# Patient Record
Sex: Male | Born: 1940
Health system: Southern US, Community
[De-identification: ages and names within clinical notes are randomized; demographics above are authoritative.]

## PROBLEM LIST (undated history)

## (undated) DIAGNOSIS — N390 Urinary tract infection, site not specified: Secondary | ICD-10-CM

## (undated) DIAGNOSIS — N411 Chronic prostatitis: Secondary | ICD-10-CM

## (undated) DIAGNOSIS — I10 Essential (primary) hypertension: Secondary | ICD-10-CM

## (undated) DIAGNOSIS — M87059 Idiopathic aseptic necrosis of unspecified femur: Secondary | ICD-10-CM

## (undated) DIAGNOSIS — F17200 Nicotine dependence, unspecified, uncomplicated: Secondary | ICD-10-CM

## (undated) DIAGNOSIS — E119 Type 2 diabetes mellitus without complications: Secondary | ICD-10-CM

## (undated) DIAGNOSIS — G459 Transient cerebral ischemic attack, unspecified: Secondary | ICD-10-CM

## (undated) DIAGNOSIS — E785 Hyperlipidemia, unspecified: Secondary | ICD-10-CM

## (undated) DIAGNOSIS — N400 Enlarged prostate without lower urinary tract symptoms: Secondary | ICD-10-CM

## (undated) DIAGNOSIS — M1990 Unspecified osteoarthritis, unspecified site: Secondary | ICD-10-CM

## (undated) DIAGNOSIS — H9190 Unspecified hearing loss, unspecified ear: Secondary | ICD-10-CM

## (undated) DIAGNOSIS — N529 Male erectile dysfunction, unspecified: Secondary | ICD-10-CM

## (undated) HISTORY — DX: Type 2 diabetes mellitus without complications: E11.9

## (undated) HISTORY — DX: Idiopathic aseptic necrosis of unspecified femur: M87.059

## (undated) HISTORY — DX: Transient cerebral ischemic attack, unspecified: G45.9

## (undated) HISTORY — DX: Chronic prostatitis: N41.1

## (undated) HISTORY — PX: PROSTATE SURGERY: SHX751

## (undated) HISTORY — PX: JOINT REPLACEMENT: SHX530

## (undated) HISTORY — DX: Benign prostatic hyperplasia without lower urinary tract symptoms: N40.0

## (undated) HISTORY — DX: Nicotine dependence, unspecified, uncomplicated: F17.200

## (undated) HISTORY — DX: Essential (primary) hypertension: I10

## (undated) HISTORY — DX: Unspecified hearing loss, unspecified ear: H91.90

## (undated) HISTORY — DX: Hyperlipidemia, unspecified: E78.5

## (undated) HISTORY — DX: Urinary tract infection, site not specified: N39.0

## (undated) HISTORY — DX: Male erectile dysfunction, unspecified: N52.9

## (undated) HISTORY — PX: BLADDER STONE REMOVAL: SHX568

---

## 2006-08-22 ENCOUNTER — Emergency Department: Payer: Self-pay | Admitting: Emergency Medicine

## 2006-08-24 ENCOUNTER — Ambulatory Visit: Payer: Self-pay | Admitting: Orthopaedic Surgery

## 2006-08-24 ENCOUNTER — Other Ambulatory Visit: Payer: Self-pay

## 2007-01-10 HISTORY — PX: AMPUTATION: SHX166

## 2008-03-10 ENCOUNTER — Inpatient Hospital Stay: Payer: Self-pay | Admitting: Urology

## 2008-11-10 ENCOUNTER — Ambulatory Visit: Payer: Self-pay | Admitting: Family Medicine

## 2009-11-19 ENCOUNTER — Ambulatory Visit: Payer: Self-pay | Admitting: Internal Medicine

## 2010-08-10 ENCOUNTER — Ambulatory Visit: Payer: Self-pay | Admitting: Urology

## 2010-08-15 ENCOUNTER — Ambulatory Visit: Payer: Self-pay | Admitting: Anesthesiology

## 2010-08-23 ENCOUNTER — Ambulatory Visit: Payer: Self-pay | Admitting: Urology

## 2010-08-25 LAB — PATHOLOGY REPORT

## 2010-10-24 ENCOUNTER — Ambulatory Visit: Payer: Self-pay | Admitting: Sports Medicine

## 2010-11-29 ENCOUNTER — Emergency Department: Payer: Self-pay | Admitting: Emergency Medicine

## 2011-02-22 ENCOUNTER — Ambulatory Visit: Payer: Self-pay | Admitting: Orthopedic Surgery

## 2011-07-04 ENCOUNTER — Ambulatory Visit: Payer: Self-pay | Admitting: Family Medicine

## 2011-07-04 LAB — POCT INR: INR: 1 (ref <=1.1)

## 2011-07-04 LAB — PROTIME-INR: Protime: 10.4 seconds (ref 10.0–13.8)

## 2011-07-11 ENCOUNTER — Encounter: Payer: Self-pay | Admitting: Physician Assistant

## 2011-07-11 ENCOUNTER — Encounter (HOSPITAL_COMMUNITY): Payer: Self-pay | Admitting: Pharmacy Technician

## 2011-07-11 ENCOUNTER — Other Ambulatory Visit: Payer: Self-pay | Admitting: Physician Assistant

## 2011-07-11 DIAGNOSIS — E119 Type 2 diabetes mellitus without complications: Secondary | ICD-10-CM

## 2011-07-11 DIAGNOSIS — I10 Essential (primary) hypertension: Secondary | ICD-10-CM

## 2011-07-11 DIAGNOSIS — M87059 Idiopathic aseptic necrosis of unspecified femur: Secondary | ICD-10-CM | POA: Insufficient documentation

## 2011-07-11 NOTE — H&P (Signed)
Todd Diaz is an 71 y.o. male.   Chief Complaint: left knee AVN HPI: [name] is a 71 year old seen for follow-up from persistent left knee pain with medial compartment AVN. He had a MRI of the left knee 2 months ago that showed medial compartment AVN with no other significant abnormality in the knee. He continues to have left knee pain medially. Pain with weightbearing twisting and turning activities relieved by rest. He's limping and has difficulty arising from a seated position.   Past Medical History  Diagnosis Date  . Hypertension   . Diabetes mellitus   . Avascular necrosis of medial femoral condyle     Past Surgical History  Procedure Date  . Prostate surgery   . Bladder stone removal     Family History  Problem Relation Age of Onset  . Hypertension Mother   . Cancer Mother   . Heart attack Father   . Hypertension Father    Social History:  reports that he quit smoking about 35 years ago. He does not have any smokeless tobacco history on file. He reports that he drinks alcohol. His drug history not on file.  Allergies: No Known Allergies Current Outpatient Prescriptions on File Prior to Visit  Medication Sig Dispense Refill  . aspirin EC 81 MG tablet Take 81 mg by mouth daily.      . hydrochlorothiazide (HYDRODIURIL) 25 MG tablet Take 25 mg by mouth daily.      Marland Kitchen lisinopril (PRINIVIL,ZESTRIL) 20 MG tablet Take 20 mg by mouth daily.      . metFORMIN (GLUCOPHAGE) 500 MG tablet Take 500 mg by mouth 2 (two) times daily with a meal.      . niacin (NIASPAN) 500 MG CR tablet Take 500 mg by mouth at bedtime.         (Not in a hospital admission)  No results found for this or any previous visit (from the past 48 hour(s)). No results found.  Review of Systems  Constitutional: Negative.   HENT: Negative.   Eyes: Negative.   Respiratory: Negative.   Cardiovascular: Negative.   Gastrointestinal: Negative.   Genitourinary: Negative.   Musculoskeletal: Positive for  joint pain.       Left knee pain  Skin: Negative.   Neurological: Negative.   Endo/Heme/Allergies:       Not pertinent to current symptomatology therefore not examined.    Blood pressure 144/81, temperature 97.5 F (36.4 C), height 5\' 10"  (1.778 m), weight 90.719 kg (200 lb), SpO2 97.00%. Physical Exam  Constitutional: He is oriented to person, place, and time. He appears well-developed and well-nourished.  HENT:  Head: Normocephalic and atraumatic.  Eyes: Conjunctivae and EOM are normal. Pupils are equal, round, and reactive to light.  Neck: Normal range of motion. Neck supple.  Cardiovascular: Normal rate and regular rhythm.   Respiratory: Effort normal and breath sounds normal.  GI: Soft. Bowel sounds are normal.  Genitourinary:       Not pertinent to current symptomatology therefore not examined.  Musculoskeletal: He exhibits edema and tenderness.         He is independently ambulatory with a moderately antalgic gait. He has active range of motion 0-120 degrees bilaterally. Medial joint line tenderness on the left.  No pain on the right.  2+ crepitus bilaterally.  1+ synovitis bilaterally.      Neurological: He is alert and oriented to person, place, and time. He has normal reflexes.  Skin: Skin is warm and  dry.  Psychiatric: He has a normal mood and affect. His behavior is normal. Judgment and thought content normal.     Assessment Patient Active Problem List  Diagnosis  . Hypertension  . Diabetes mellitus  . Avascular necrosis of medial femoral condyle    Plan I talk to him about this in detail. I think with the significant findings lack of response to conservative care that he is a candidate for unicompartmental arthroplasty and I would recommend we proceed with this due to failed conservative care. Discussed risks benefits and possible complications of the surgery in detail and he understands this completely. He has been cleared preoperatively by his primary care  physician Dr. Nilda Simmer.  Todd Diaz 07/11/2011, 4:30 PM

## 2011-07-17 ENCOUNTER — Encounter (HOSPITAL_COMMUNITY): Payer: Self-pay

## 2011-07-17 ENCOUNTER — Encounter (HOSPITAL_COMMUNITY)
Admission: RE | Admit: 2011-07-17 | Discharge: 2011-07-17 | Disposition: A | Discharge status: Home / Self Care | Payer: Medicare PPO | Source: Ambulatory Visit | Attending: Orthopedic Surgery | Admitting: Orthopedic Surgery

## 2011-07-17 ENCOUNTER — Encounter (HOSPITAL_COMMUNITY): Admission: RE | Admit: 2011-07-17 | Payer: Medicare PPO | Source: Ambulatory Visit

## 2011-07-17 HISTORY — DX: Unspecified osteoarthritis, unspecified site: M19.90

## 2011-07-17 LAB — DIFFERENTIAL
Basophils Absolute: 0 10*3/uL (ref 0.0–0.1)
Basophils Relative: 0 % (ref 0–1)
Eosinophils Absolute: 0.1 10*3/uL (ref 0.0–0.7)
Eosinophils Relative: 2 % (ref 0–5)
Lymphocytes Relative: 26 % (ref 12–46)
Lymphs Abs: 1.7 10*3/uL (ref 0.7–4.0)
Monocytes Absolute: 0.6 10*3/uL (ref 0.1–1.0)
Monocytes Relative: 9 % (ref 3–12)
Neutro Abs: 4.2 10*3/uL (ref 1.7–7.7)
Neutrophils Relative %: 63 % (ref 43–77)

## 2011-07-17 LAB — ABO/RH: ABO/RH(D): O POS

## 2011-07-17 LAB — URINALYSIS, ROUTINE W REFLEX MICROSCOPIC
Bilirubin Urine: NEGATIVE
Glucose, UA: NEGATIVE mg/dL
Hgb urine dipstick: NEGATIVE
Ketones, ur: NEGATIVE mg/dL
Leukocytes, UA: NEGATIVE
Nitrite: NEGATIVE
Protein, ur: NEGATIVE mg/dL
Specific Gravity, Urine: 1.011 (ref 1.005–1.030)
Urobilinogen, UA: 0.2 mg/dL (ref 0.0–1.0)
pH: 5.5 (ref 5.0–8.0)

## 2011-07-17 LAB — COMPREHENSIVE METABOLIC PANEL
ALT: 22 U/L (ref 0–53)
AST: 26 U/L (ref 0–37)
Albumin: 4.1 g/dL (ref 3.5–5.2)
Alkaline Phosphatase: 84 U/L (ref 39–117)
BUN: 9 mg/dL (ref 6–23)
CO2: 28 mEq/L (ref 19–32)
Calcium: 9.8 mg/dL (ref 8.4–10.5)
Chloride: 102 mEq/L (ref 96–112)
Creatinine, Ser: 0.76 mg/dL (ref 0.50–1.35)
GFR calc Af Amer: >90 mL/min (ref >90)
GFR calc non Af Amer: 89 mL/min — ABNORMAL LOW (ref >90)
Glucose, Bld: 97 mg/dL (ref 70–99)
Potassium: 4.1 mEq/L (ref 3.5–5.1)
Sodium: 140 mEq/L (ref 135–145)
Total Bilirubin: 0.4 mg/dL (ref 0.3–1.2)
Total Protein: 7.6 g/dL (ref 6.0–8.3)

## 2011-07-17 LAB — CBC
HCT: 46.5 % (ref 39.0–52.0)
Hemoglobin: 16 g/dL (ref 13.0–17.0)
MCH: 31.5 pg (ref 26.0–34.0)
MCHC: 34.4 g/dL (ref 30.0–36.0)
MCV: 91.5 fL (ref 78.0–100.0)
Platelets: 183 10*3/uL (ref 150–400)
RBC: 5.08 MIL/uL (ref 4.22–5.81)
RDW: 13.5 % (ref 11.5–15.5)
WBC: 6.6 10*3/uL (ref 4.0–10.5)

## 2011-07-17 LAB — TYPE AND SCREEN
ABO/RH(D): O POS
Antibody Screen: NEGATIVE

## 2011-07-17 LAB — PROTIME-INR
INR: 0.96 (ref 0.00–1.49)
Prothrombin Time: 13 seconds (ref 11.6–15.2)

## 2011-07-17 LAB — SURGICAL PCR SCREEN
MRSA, PCR: NEGATIVE
Staphylococcus aureus: NEGATIVE

## 2011-07-17 LAB — APTT: aPTT: 32 seconds (ref 24–37)

## 2011-07-17 NOTE — Pre-Procedure Instructions (Signed)
20 ESDRAS DELAIR  07/17/2011   Your procedure is scheduled on:   Monday  07/24/11  Report to [facility] at 1000 AM.  Call this number if you have problems the morning of surgery: [phone]   Remember:   Do not eat food OR DRINK After Midnight   Take these medicines the morning of surgery with A SIP OF WATER: NONE     Do not wear jewelry, make-up or nail polish.  Do not wear lotions, powders, or perfumes. You may wear deodorant.  Do not shave 48 hours prior to surgery. Men may shave face and neck.  Do not bring valuables to the hospital.  Contacts, dentures or bridgework may not be worn into surgery.  Leave suitcase in the car. After surgery it may be brought to your room.  For patients admitted to the hospital, checkout time is 11:00 AM the day of discharge.   Patients discharged the day of surgery will not be allowed to drive home.  Name and phone number of your driver:   Special Instructions: CHG Shower Use Special Wash: 1/2 bottle night before surgery and 1/2 bottle morning of surgery.   Please read over the following fact sheets that you were given: Pain Booklet, Coughing and Deep Breathing, Blood Transfusion Information, Total Joint Packet, MRSA Information and Surgical Site Infection Prevention

## 2011-07-17 NOTE — Progress Notes (Signed)
REQUESTED EKG AND CXR FROM  PATIENT'S MED MD (DR [facility]) [phone]

## 2011-07-18 ENCOUNTER — Other Ambulatory Visit (HOSPITAL_COMMUNITY): Payer: Self-pay

## 2011-07-19 ENCOUNTER — Other Ambulatory Visit: Payer: Self-pay | Admitting: Physician Assistant

## 2011-07-19 LAB — URINE CULTURE: Colony Count: 25000

## 2011-07-19 NOTE — Progress Notes (Signed)
Urine Culture noted to abnormal results and Reason for consent(consent order) has not been updated w/correct reason even after Fritzi Mandes, [place], notified on 07/17/11.  Office notified of both and message sent to Dr. Sherene Sires PA, Kirsten per office personal-Lauren.//L. Mohit Zirbes,RN

## 2011-07-20 NOTE — Progress Notes (Signed)
Dr. Sherene Sires office called unit to inform consent has been revised w/correct info & the need for urine culture on DOS.//L. Shivam Mestas,RN

## 2011-07-21 NOTE — Progress Notes (Signed)
INSTRUCTED PATIENT TO ARRIVE AT 530 AM Monday 07/24/11.

## 2011-07-23 MED ORDER — CEFAZOLIN SODIUM-DEXTROSE 2-3 GM-% IV SOLR
2.0000 g | INTRAVENOUS | Status: AC
  Administered 2011-07-24: 2 g via INTRAVENOUS
  Filled 2011-07-23: qty 50

## 2011-07-24 ENCOUNTER — Encounter (HOSPITAL_COMMUNITY): Payer: Self-pay | Admitting: *Deleted

## 2011-07-24 ENCOUNTER — Encounter (HOSPITAL_COMMUNITY): Payer: Self-pay | Admitting: Certified Registered"

## 2011-07-24 ENCOUNTER — Encounter (HOSPITAL_COMMUNITY)
Admission: RE | Disposition: A | Discharge status: Home / Self Care | Payer: Self-pay | Source: Ambulatory Visit | Attending: Orthopedic Surgery

## 2011-07-24 ENCOUNTER — Inpatient Hospital Stay (HOSPITAL_COMMUNITY)
Admission: RE | Admit: 2011-07-24 | Discharge: 2011-07-25 | DRG: 470 | Disposition: A | Discharge status: Home / Self Care | Payer: Medicare PPO | Source: Ambulatory Visit | Attending: Orthopedic Surgery | Admitting: Orthopedic Surgery

## 2011-07-24 ENCOUNTER — Ambulatory Visit (HOSPITAL_COMMUNITY): Payer: Medicare PPO | Admitting: Certified Registered"

## 2011-07-24 DIAGNOSIS — Z01812 Encounter for preprocedural laboratory examination: Secondary | ICD-10-CM

## 2011-07-24 DIAGNOSIS — Z7982 Long term (current) use of aspirin: Secondary | ICD-10-CM

## 2011-07-24 DIAGNOSIS — Z87891 Personal history of nicotine dependence: Secondary | ICD-10-CM

## 2011-07-24 DIAGNOSIS — I10 Essential (primary) hypertension: Secondary | ICD-10-CM | POA: Diagnosis present

## 2011-07-24 DIAGNOSIS — M8708 Idiopathic aseptic necrosis of bone, other site: Principal | ICD-10-CM | POA: Diagnosis present

## 2011-07-24 DIAGNOSIS — M87059 Idiopathic aseptic necrosis of unspecified femur: Secondary | ICD-10-CM | POA: Diagnosis present

## 2011-07-24 DIAGNOSIS — E119 Type 2 diabetes mellitus without complications: Secondary | ICD-10-CM | POA: Diagnosis present

## 2011-07-24 DIAGNOSIS — IMO0002 Reserved for concepts with insufficient information to code with codable children: Secondary | ICD-10-CM | POA: Diagnosis present

## 2011-07-24 DIAGNOSIS — M1710 Unilateral primary osteoarthritis, unspecified knee: Secondary | ICD-10-CM | POA: Diagnosis present

## 2011-07-24 HISTORY — PX: PARTIAL KNEE ARTHROPLASTY: SHX2174

## 2011-07-24 LAB — GLUCOSE, CAPILLARY
Glucose-Capillary: 110 mg/dL — ABNORMAL HIGH (ref 70–99)
Glucose-Capillary: 128 mg/dL — ABNORMAL HIGH (ref 70–99)
Glucose-Capillary: 138 mg/dL — ABNORMAL HIGH (ref 70–99)
Glucose-Capillary: 117 mg/dL — ABNORMAL HIGH (ref 70–99)
Glucose-Capillary: 120 mg/dL — ABNORMAL HIGH (ref 70–99)

## 2011-07-24 LAB — HEMOGLOBIN A1C
Hgb A1c MFr Bld: 5.8 % — ABNORMAL HIGH (ref <5.7)
Mean Plasma Glucose: 120 mg/dL — ABNORMAL HIGH (ref <117)

## 2011-07-24 SURGERY — ARTHROPLASTY, KNEE, UNICOMPARTMENTAL
Anesthesia: General | Site: Knee | Laterality: Left | Wound class: Clean

## 2011-07-24 MED ORDER — MENTHOL 3 MG MT LOZG: 1.0000 | LOZENGE | OROMUCOSAL | Status: DC | PRN

## 2011-07-24 MED ORDER — GLYCOPYRROLATE 0.2 MG/ML IJ SOLN
INTRAMUSCULAR | Status: DC | PRN
  Administered 2011-07-24: 0.4 mg via INTRAVENOUS

## 2011-07-24 MED ORDER — MIDAZOLAM HCL 5 MG/5ML IJ SOLN
INTRAMUSCULAR | Status: DC | PRN
  Administered 2011-07-24: 2 mg via INTRAVENOUS

## 2011-07-24 MED ORDER — METOCLOPRAMIDE HCL 5 MG/ML IJ SOLN: 5.0000 mg | Freq: Three times a day (TID) | INTRAMUSCULAR | Status: DC | PRN

## 2011-07-24 MED ORDER — BUPIVACAINE-EPINEPHRINE 0.25% -1:200000 IJ SOLN
INTRAMUSCULAR | Status: DC | PRN
  Administered 2011-07-24: 30 mL

## 2011-07-24 MED ORDER — CELECOXIB 200 MG PO CAPS
ORAL_CAPSULE | ORAL | Status: AC
  Filled 2011-07-24: qty 1

## 2011-07-24 MED ORDER — PROPOFOL 10 MG/ML IV EMUL
INTRAVENOUS | Status: DC | PRN
  Administered 2011-07-24: 140 mg via INTRAVENOUS

## 2011-07-24 MED ORDER — OXYCODONE HCL 5 MG PO TABS
5.0000 mg | ORAL_TABLET | ORAL | Status: DC | PRN
  Administered 2011-07-24: 10 mg via ORAL

## 2011-07-24 MED ORDER — ENOXAPARIN SODIUM 30 MG/0.3ML ~~LOC~~ SOLN
30.0000 mg | Freq: Two times a day (BID) | SUBCUTANEOUS | Status: DC
  Administered 2011-07-25: 30 mg via SUBCUTANEOUS
  Filled 2011-07-24 (×3): qty 0.3

## 2011-07-24 MED ORDER — LIDOCAINE HCL (CARDIAC) 20 MG/ML IV SOLN
INTRAVENOUS | Status: DC | PRN
  Administered 2011-07-24: 30 mg via INTRAVENOUS

## 2011-07-24 MED ORDER — ACETAMINOPHEN 650 MG RE SUPP: 650.0000 mg | Freq: Four times a day (QID) | RECTAL | Status: DC | PRN

## 2011-07-24 MED ORDER — EPHEDRINE SULFATE 50 MG/ML IJ SOLN
INTRAMUSCULAR | Status: DC | PRN
  Administered 2011-07-24 (×3): 5 mg via INTRAVENOUS

## 2011-07-24 MED ORDER — DIAZEPAM 5 MG/ML IJ SOLN
5.0000 mg | Freq: Once | INTRAMUSCULAR | Status: AC
  Administered 2011-07-24: 5 mg via INTRAVENOUS
  Filled 2011-07-24: qty 2

## 2011-07-24 MED ORDER — DIAZEPAM 5 MG PO TABS
5.0000 mg | ORAL_TABLET | Freq: Four times a day (QID) | ORAL | Status: DC | PRN
  Administered 2011-07-24: 5 mg via ORAL
  Filled 2011-07-24: qty 1

## 2011-07-24 MED ORDER — BUPIVACAINE-EPINEPHRINE PF 0.25-1:200000 % IJ SOLN
INTRAMUSCULAR | Status: AC
Start: 2011-07-24 — End: 2011-07-24
  Filled 2011-07-24: qty 30

## 2011-07-24 MED ORDER — INSULIN ASPART 100 UNIT/ML ~~LOC~~ SOLN: 0.0000 [IU] | Freq: Three times a day (TID) | SUBCUTANEOUS | Status: DC

## 2011-07-24 MED ORDER — LISINOPRIL 20 MG PO TABS
20.0000 mg | ORAL_TABLET | Freq: Every day | ORAL | Status: DC
  Administered 2011-07-24 – 2011-07-25 (×2): 20 mg via ORAL
  Filled 2011-07-24 (×2): qty 1

## 2011-07-24 MED ORDER — CEFAZOLIN SODIUM-DEXTROSE 2-3 GM-% IV SOLR
2.0000 g | Freq: Four times a day (QID) | INTRAVENOUS | Status: AC
  Administered 2011-07-24 (×2): 2 g via INTRAVENOUS
  Filled 2011-07-24 (×2): qty 50

## 2011-07-24 MED ORDER — HYDROMORPHONE HCL PF 1 MG/ML IJ SOLN: 0.5000 mg | INTRAMUSCULAR | Status: DC | PRN

## 2011-07-24 MED ORDER — NIACIN ER (ANTIHYPERLIPIDEMIC) 500 MG PO TBCR
500.0000 mg | EXTENDED_RELEASE_TABLET | Freq: Every day | ORAL | Status: DC
  Administered 2011-07-24: 500 mg via ORAL
  Filled 2011-07-24 (×2): qty 1

## 2011-07-24 MED ORDER — ROCURONIUM BROMIDE 100 MG/10ML IV SOLN
INTRAVENOUS | Status: DC | PRN
  Administered 2011-07-24: 50 mg via INTRAVENOUS

## 2011-07-24 MED ORDER — METOCLOPRAMIDE HCL 10 MG PO TABS: 5.0000 mg | ORAL_TABLET | Freq: Three times a day (TID) | ORAL | Status: DC | PRN

## 2011-07-24 MED ORDER — ONDANSETRON HCL 4 MG PO TABS: 4.0000 mg | ORAL_TABLET | Freq: Four times a day (QID) | ORAL | Status: DC | PRN

## 2011-07-24 MED ORDER — BISACODYL 5 MG PO TBEC
5.0000 mg | DELAYED_RELEASE_TABLET | Freq: Every day | ORAL | Status: DC
  Administered 2011-07-24: 5 mg via ORAL
  Filled 2011-07-24: qty 1

## 2011-07-24 MED ORDER — CELECOXIB 200 MG PO CAPS
200.0000 mg | ORAL_CAPSULE | Freq: Two times a day (BID) | ORAL | Status: DC
  Administered 2011-07-24 – 2011-07-25 (×3): 200 mg via ORAL
  Filled 2011-07-24 (×3): qty 1

## 2011-07-24 MED ORDER — LACTATED RINGERS IV SOLN
INTRAVENOUS | Status: DC | PRN
  Administered 2011-07-24 (×3): via INTRAVENOUS

## 2011-07-24 MED ORDER — PHENOL 1.4 % MT LIQD: 1.0000 | OROMUCOSAL | Status: DC | PRN

## 2011-07-24 MED ORDER — ONDANSETRON HCL 4 MG/2ML IJ SOLN: 4.0000 mg | Freq: Four times a day (QID) | INTRAMUSCULAR | Status: DC | PRN

## 2011-07-24 MED ORDER — POTASSIUM CHLORIDE IN NACL 20-0.9 MEQ/L-% IV SOLN
INTRAVENOUS | Status: DC
  Administered 2011-07-24: 13:00:00 via INTRAVENOUS
  Filled 2011-07-24 (×4): qty 1000

## 2011-07-24 MED ORDER — OXYCODONE HCL 5 MG PO TABS
ORAL_TABLET | ORAL | Status: AC
  Filled 2011-07-24: qty 2

## 2011-07-24 MED ORDER — NEOSTIGMINE METHYLSULFATE 1 MG/ML IJ SOLN
INTRAMUSCULAR | Status: DC | PRN
  Administered 2011-07-24: 3 mg via INTRAVENOUS

## 2011-07-24 MED ORDER — INSULIN ASPART 100 UNIT/ML ~~LOC~~ SOLN: 0.0000 [IU] | Freq: Every day | SUBCUTANEOUS | Status: DC

## 2011-07-24 MED ORDER — CEFUROXIME SODIUM 1.5 G IJ SOLR
INTRAMUSCULAR | Status: AC
Start: 2011-07-24 — End: 2011-07-24
  Filled 2011-07-24: qty 1.5

## 2011-07-24 MED ORDER — ONDANSETRON HCL 4 MG/2ML IJ SOLN
INTRAMUSCULAR | Status: DC | PRN
  Administered 2011-07-24: 4 mg via INTRAVENOUS

## 2011-07-24 MED ORDER — FENTANYL CITRATE 0.05 MG/ML IJ SOLN
INTRAMUSCULAR | Status: DC | PRN
  Administered 2011-07-24: 100 ug via INTRAVENOUS
  Administered 2011-07-24: 50 ug via INTRAVENOUS
  Administered 2011-07-24: 100 ug via INTRAVENOUS
  Administered 2011-07-24: 50 ug via INTRAVENOUS

## 2011-07-24 MED ORDER — ONDANSETRON HCL 4 MG/2ML IJ SOLN: 4.0000 mg | Freq: Once | INTRAMUSCULAR | Status: DC | PRN

## 2011-07-24 MED ORDER — CHLORHEXIDINE GLUCONATE 4 % EX LIQD: 60.0000 mL | Freq: Once | CUTANEOUS | Status: DC

## 2011-07-24 MED ORDER — DOCUSATE SODIUM 100 MG PO CAPS
100.0000 mg | ORAL_CAPSULE | Freq: Two times a day (BID) | ORAL | Status: DC
  Administered 2011-07-24 – 2011-07-25 (×3): 100 mg via ORAL
  Filled 2011-07-24 (×3): qty 1

## 2011-07-24 MED ORDER — CEFUROXIME SODIUM 1.5 G IJ SOLR
INTRAMUSCULAR | Status: DC | PRN
  Administered 2011-07-24: 1.5 g

## 2011-07-24 MED ORDER — POVIDONE-IODINE 7.5 % EX SOLN
Freq: Once | CUTANEOUS | Status: DC
  Filled 2011-07-24: qty 118

## 2011-07-24 MED ORDER — ACETAMINOPHEN 325 MG PO TABS: 650.0000 mg | ORAL_TABLET | Freq: Four times a day (QID) | ORAL | Status: DC | PRN

## 2011-07-24 MED ORDER — SODIUM CHLORIDE 0.9 % IR SOLN
Status: DC | PRN
  Administered 2011-07-24: 1000 mL

## 2011-07-24 MED ORDER — HYDROMORPHONE HCL PF 1 MG/ML IJ SOLN: 0.2500 mg | INTRAMUSCULAR | Status: DC | PRN

## 2011-07-24 MED ORDER — LACTATED RINGERS IV SOLN: INTRAVENOUS | Status: DC

## 2011-07-24 SURGICAL SUPPLY — 72 items
BANDAGE ESMARK 6X9 LF (GAUZE/BANDAGES/DRESSINGS) ×2 IMPLANT
BLADE SAGITTAL 25.0X1.19X90 (BLADE) ×3 IMPLANT
BLADE SAW SGTL 11.0X1.19X90.0M (BLADE) IMPLANT
BLADE SAW SGTL 13.0X1.19X90.0M (BLADE) ×3 IMPLANT
BLADE SURG 10 STRL SS (BLADE) ×6 IMPLANT
BNDG ELASTIC 6X15 VLCR STRL LF (GAUZE/BANDAGES/DRESSINGS) IMPLANT
BNDG ESMARK 6X9 LF (GAUZE/BANDAGES/DRESSINGS) ×3
BOWL SMART MIX CTS (DISPOSABLE) ×3 IMPLANT
CEMENT HV SMART SET (Cement) ×6 IMPLANT
CLOTH BEACON ORANGE TIMEOUT ST (SAFETY) ×3 IMPLANT
COVER BACK TABLE 24X17X13 BIG (DRAPES) IMPLANT
COVER PROBE W GEL 5X96 (DRAPES) IMPLANT
COVER SURGICAL LIGHT HANDLE (MISCELLANEOUS) ×3 IMPLANT
CUFF TOURNIQUET SINGLE 34IN LL (TOURNIQUET CUFF) ×3 IMPLANT
CUFF TOURNIQUET SINGLE 44IN (TOURNIQUET CUFF) IMPLANT
DRAPE EXTREMITY T 121X128X90 (DRAPE) ×3 IMPLANT
DRAPE INCISE IOBAN 66X45 STRL (DRAPES) ×3 IMPLANT
DRAPE PROXIMA HALF (DRAPES) ×3 IMPLANT
DRAPE U-SHAPE 47X51 STRL (DRAPES) ×3 IMPLANT
DRSG ADAPTIC 3X8 NADH LF (GAUZE/BANDAGES/DRESSINGS) IMPLANT
DRSG PAD ABDOMINAL 8X10 ST (GAUZE/BANDAGES/DRESSINGS) ×3 IMPLANT
DURAPREP 26ML APPLICATOR (WOUND CARE) ×3 IMPLANT
ELECT CAUTERY BLADE 6.4 (BLADE) ×3 IMPLANT
ELECT REM PT RETURN 9FT ADLT (ELECTROSURGICAL) ×3
ELECTRODE REM PT RTRN 9FT ADLT (ELECTROSURGICAL) ×2 IMPLANT
EVACUATOR 1/8 PVC DRAIN (DRAIN) ×3 IMPLANT
FACESHIELD LNG OPTICON STERILE (SAFETY) ×3 IMPLANT
GLOVE BIO SURGEON STRL SZ7 (GLOVE) ×3 IMPLANT
GLOVE BIOGEL PI IND STRL 7.0 (GLOVE) ×4 IMPLANT
GLOVE BIOGEL PI IND STRL 7.5 (GLOVE) ×4 IMPLANT
GLOVE BIOGEL PI IND STRL 8 (GLOVE) ×2 IMPLANT
GLOVE BIOGEL PI INDICATOR 7.0 (GLOVE) ×2
GLOVE BIOGEL PI INDICATOR 7.5 (GLOVE) ×2
GLOVE BIOGEL PI INDICATOR 8 (GLOVE) ×1
GLOVE SS BIOGEL STRL SZ 7.5 (GLOVE) ×2 IMPLANT
GLOVE SUPERSENSE BIOGEL SZ 7.5 (GLOVE) ×1
GLOVE SURG SS PI 6.5 STRL IVOR (GLOVE) ×6 IMPLANT
GLOVE SURG SS PI 7.5 STRL IVOR (GLOVE) ×3 IMPLANT
GOWN PREVENTION PLUS XLARGE (GOWN DISPOSABLE) IMPLANT
GOWN STRL NON-REIN LRG LVL3 (GOWN DISPOSABLE) IMPLANT
GOWN STRL REIN XL XLG (GOWN DISPOSABLE) ×12 IMPLANT
HANDPIECE INTERPULSE COAX TIP (DISPOSABLE) ×1
HOOD PEEL AWAY FACE SHEILD DIS (HOOD) ×9 IMPLANT
IMMOBILIZER KNEE 22 UNIV (SOFTGOODS) IMPLANT
INSERT CUSHION PRONEVIEW LG (MISCELLANEOUS) IMPLANT
KIT BASIN OR (CUSTOM PROCEDURE TRAY) ×3 IMPLANT
KIT ROOM TURNOVER OR (KITS) ×3 IMPLANT
KIT SAW BLADE (KITS) ×3 IMPLANT
MANIFOLD NEPTUNE II (INSTRUMENTS) ×3 IMPLANT
NS IRRIG 1000ML POUR BTL (IV SOLUTION) ×3 IMPLANT
PACK TOTAL JOINT (CUSTOM PROCEDURE TRAY) ×3 IMPLANT
PAD ARMBOARD 7.5X6 YLW CONV (MISCELLANEOUS) ×6 IMPLANT
PAD CAST 4YDX4 CTTN HI CHSV (CAST SUPPLIES) IMPLANT
PADDING CAST COTTON 4X4 STRL (CAST SUPPLIES)
PADDING CAST COTTON 6X4 STRL (CAST SUPPLIES) IMPLANT
POSITIONER HEAD PRONE TRACH (MISCELLANEOUS) ×3 IMPLANT
RUBBERBAND STERILE (MISCELLANEOUS) ×3 IMPLANT
SET HNDPC FAN SPRY TIP SCT (DISPOSABLE) ×2 IMPLANT
SPONGE GAUZE 4X4 12PLY (GAUZE/BANDAGES/DRESSINGS) IMPLANT
STRIP CLOSURE SKIN 1/2X4 (GAUZE/BANDAGES/DRESSINGS) IMPLANT
SUCTION FRAZIER TIP 10 FR DISP (SUCTIONS) ×3 IMPLANT
SUT MNCRL AB 3-0 PS2 18 (SUTURE) ×3 IMPLANT
SUT VIC AB 0 CT1 27 (SUTURE) ×2
SUT VIC AB 0 CT1 27XBRD ANBCTR (SUTURE) ×4 IMPLANT
SUT VIC AB 2-0 CT1 27 (SUTURE) ×2
SUT VIC AB 2-0 CT1 TAPERPNT 27 (SUTURE) ×4 IMPLANT
SUT VLOC 180 0 24IN GS25 (SUTURE) ×3 IMPLANT
SYR 30ML SLIP (SYRINGE) ×3 IMPLANT
TOWEL OR 17X24 6PK STRL BLUE (TOWEL DISPOSABLE) ×3 IMPLANT
TOWEL OR 17X26 10 PK STRL BLUE (TOWEL DISPOSABLE) ×3 IMPLANT
TRAY FOLEY CATH 14FR (SET/KITS/TRAYS/PACK) ×3 IMPLANT
WATER STERILE IRR 1000ML POUR (IV SOLUTION) ×3 IMPLANT

## 2011-07-24 NOTE — Brief Op Note (Signed)
07/24/2011  11:56 AM  PATIENT:  Todd Diaz  70 y.o. male  PRE-OPERATIVE DIAGNOSIS:  DJD LEFT KNEE  POST-OPERATIVE DIAGNOSIS:  DJD LEFT KNEE  PROCEDURE:  Procedure(s) (LRB): UNICOMPARTMENTAL KNEE (Left)  SURGEON:  Surgeon(s) and Role:    * Nilda Simmer, MD - Primary    * Eulas Post, MD - Assisting  PHYSICIAN ASSISTANT: Julien Girt PA-C  ASSISTANTS:  Teryl Lucy MD   Kirstin Shepperson PA-C  ANESTHESIA:   general  EBL:  Total I/O In: 2240 [P.O.:240; I.V.:2000] Out: 400 [Urine:275; Drains:100; Blood:25]  BLOOD ADMINISTERED:none  DRAINS: (1) Hemovact drain(s) in the left knee with  Suction Clamped   LOCAL MEDICATIONS USED:  NONE  SPECIMEN:  No Specimen  DISPOSITION OF SPECIMEN:  N/A  COUNTS:  YES  TOURNIQUET:   Total Tourniquet Time Documented: Thigh (Left) - 120 minutes  DICTATION: .Other Dictation: Dictation Number [id number]  PLAN OF CARE: Admit to inpatient   PATIENT DISPOSITION:  PACU - hemodynamically stable.   Delay start of Pharmacological VTE agent (>24hrs) due to surgical blood loss or risk of bleeding: no

## 2011-07-24 NOTE — H&P (View-Only) (Signed)
Todd Diaz is an 71 y.o. male.   Chief Complaint: left knee AVN HPI: [name] is a 71 year old seen for follow-up from persistent left knee pain with medial compartment AVN. He had a MRI of the left knee 2 months ago that showed medial compartment AVN with no other significant abnormality in the knee. He continues to have left knee pain medially. Pain with weightbearing twisting and turning activities relieved by rest. He's limping and has difficulty arising from a seated position.   Past Medical History  Diagnosis Date  . Hypertension   . Diabetes mellitus   . Avascular necrosis of medial femoral condyle     Past Surgical History  Procedure Date  . Prostate surgery   . Bladder stone removal     Family History  Problem Relation Age of Onset  . Hypertension Mother   . Cancer Mother   . Heart attack Father   . Hypertension Father    Social History:  reports that he quit smoking about 35 years ago. He does not have any smokeless tobacco history on file. He reports that he drinks alcohol. His drug history not on file.  Allergies: No Known Allergies Current Outpatient Prescriptions on File Prior to Visit  Medication Sig Dispense Refill  . aspirin EC 81 MG tablet Take 81 mg by mouth daily.      . hydrochlorothiazide (HYDRODIURIL) 25 MG tablet Take 25 mg by mouth daily.      . lisinopril (PRINIVIL,ZESTRIL) 20 MG tablet Take 20 mg by mouth daily.      . metFORMIN (GLUCOPHAGE) 500 MG tablet Take 500 mg by mouth 2 (two) times daily with a meal.      . niacin (NIASPAN) 500 MG CR tablet Take 500 mg by mouth at bedtime.         (Not in a hospital admission)  No results found for this or any previous visit (from the past 48 hour(s)). No results found.  Review of Systems  Constitutional: Negative.   HENT: Negative.   Eyes: Negative.   Respiratory: Negative.   Cardiovascular: Negative.   Gastrointestinal: Negative.   Genitourinary: Negative.   Musculoskeletal: Positive for  joint pain.       Left knee pain  Skin: Negative.   Neurological: Negative.   Endo/Heme/Allergies:       Not pertinent to current symptomatology therefore not examined.    Blood pressure 144/81, temperature 97.5 F (36.4 C), height 5' 10" (1.778 m), weight 90.719 kg (200 lb), SpO2 97.00%. Physical Exam  Constitutional: He is oriented to person, place, and time. He appears well-developed and well-nourished.  HENT:  Head: Normocephalic and atraumatic.  Eyes: Conjunctivae and EOM are normal. Pupils are equal, round, and reactive to light.  Neck: Normal range of motion. Neck supple.  Cardiovascular: Normal rate and regular rhythm.   Respiratory: Effort normal and breath sounds normal.  GI: Soft. Bowel sounds are normal.  Genitourinary:       Not pertinent to current symptomatology therefore not examined.  Musculoskeletal: He exhibits edema and tenderness.         He is independently ambulatory with a moderately antalgic gait. He has active range of motion 0-120 degrees bilaterally. Medial joint line tenderness on the left.  No pain on the right.  2+ crepitus bilaterally.  1+ synovitis bilaterally.      Neurological: He is alert and oriented to person, place, and time. He has normal reflexes.  Skin: Skin is warm and   dry.  Psychiatric: He has a normal mood and affect. His behavior is normal. Judgment and thought content normal.     Assessment Patient Active Problem List  Diagnosis  . Hypertension  . Diabetes mellitus  . Avascular necrosis of medial femoral condyle    Plan I talk to him about this in detail. I think with the significant findings lack of response to conservative care that he is a candidate for unicompartmental arthroplasty and I would recommend we proceed with this due to failed conservative care. Discussed risks benefits and possible complications of the surgery in detail and he understands this completely. He has been cleared preoperatively by his primary care  physician Dr. Kristi Smith.  Kennita Pavlovich J 07/11/2011, 4:30 PM    

## 2011-07-24 NOTE — Anesthesia Procedure Notes (Signed)
Anesthesia Regional Block:  Femoral nerve block  Pre-Anesthetic Checklist: ,, timeout performed, Correct Patient, Correct Site, Correct Laterality, Correct Procedure, Correct Position, site marked, Risks and benefits discussed,  Surgical consent,  Pre-op evaluation,  At surgeon's request and post-op pain management  Laterality: Left  Prep: chloraprep       Needles:  Injection technique: Single-shot  Needle Type: Stimulator Needle - 80     Needle Length:cm 9 cm Needle Gauge: 22 and 22 G    Additional Needles:  Procedures: ultrasound guided Femoral nerve block Narrative:  Start time: 07/24/2011 7:00 AM End time: 07/24/2011 7:10 AM  Performed by: Personally   Additional Notes: 30 cc 0.5% Marcaine with 1:200 Epi injected easily.  Kipp Brood, MD

## 2011-07-24 NOTE — Progress Notes (Signed)
Orthopedic Tech Progress Note Patient Details:  Todd Diaz [birth year] [id number]  CPM Left Knee CPM Left Knee: On Left Knee Flexion (Degrees): 90  Left Knee Extension (Degrees): 0    Kirk Sampley T 07/24/2011, 11:54 AM

## 2011-07-24 NOTE — Progress Notes (Signed)
Patient aware of needed urine specimen

## 2011-07-24 NOTE — Transfer of Care (Signed)
Immediate Anesthesia Transfer of Care Note  Patient: Todd Diaz  Procedure(s) Performed: Procedure(s) (LRB): UNICOMPARTMENTAL KNEE (Left)  Patient Location: PACU  Anesthesia Type: GA combined with regional for post-op pain  Level of Consciousness: awake, alert  and oriented  Airway & Oxygen Therapy: Patient Spontanous Breathing and Patient connected to nasal cannula oxygen  Post-op Assessment: Report given to PACU RN, Post -op Vital signs reviewed and stable and Patient moving all extremities X 4  Post vital signs: Reviewed and stable  Complications: No apparent anesthesia complications

## 2011-07-24 NOTE — Anesthesia Postprocedure Evaluation (Signed)
  Anesthesia Post-op Note  Patient: Todd Diaz  Procedure(s) Performed: Procedure(s) (LRB): UNICOMPARTMENTAL KNEE (Left)  Patient Location: PACU  Anesthesia Type: General and GA combined with regional for post-op pain  Level of Consciousness: awake, alert  and oriented  Airway and Oxygen Therapy: Patient Spontanous Breathing and Patient connected to nasal cannula oxygen  Post-op Pain: mild  Post-op Assessment: Post-op Vital signs reviewed and Patient's Cardiovascular Status Stable  Post-op Vital Signs: stable  Complications: No apparent anesthesia complications

## 2011-07-24 NOTE — Anesthesia Preprocedure Evaluation (Addendum)
Anesthesia Evaluation  Patient identified by MRN, date of birth, ID band Patient awake    Reviewed: Allergy & Precautions, H&P , NPO status , Patient's Chart, lab work & pertinent test results, reviewed documented beta blocker date and time   Airway Mallampati: I TM Distance: >3 FB Neck ROM: Full    Dental  (+) Edentulous Upper and Dental Advisory Given   Pulmonary  breath sounds clear to auscultation        Cardiovascular hypertension, Rhythm:Regular Rate:Normal     Neuro/Psych    GI/Hepatic   Endo/Other    Renal/GU      Musculoskeletal   Abdominal   Peds  Hematology   Anesthesia Other Findings   Reproductive/Obstetrics                          Anesthesia Physical Anesthesia Plan  ASA: III  Anesthesia Plan: General   Post-op Pain Management:    Induction:   Airway Management Planned: Oral ETT  Additional Equipment:   Intra-op Plan:   Post-operative Plan:   Informed Consent: I have reviewed the patients History and Physical, chart, labs and discussed the procedure including the risks, benefits and alternatives for the proposed anesthesia with the patient or authorized representative who has indicated his/her understanding and acceptance.   Dental advisory given  Plan Discussed with: CRNA and Surgeon  Anesthesia Plan Comments: (Htn Type 2 DM glucose 110  Plan GA with Femoral Nerve Block  Kipp Brood, MD)       Anesthesia Quick Evaluation

## 2011-07-24 NOTE — Interval H&P Note (Signed)
History and Physical Interval Note:  07/24/2011 7:07 AM  Todd Diaz  has presented today for surgery, with the diagnosis of DJD LEFT KNEE  The various methods of treatment have been discussed with the patient and family. After consideration of risks, benefits and other options for treatment, the patient has consented to  Procedure(s) (LRB): UNICOMPARTMENTAL KNEE (Left) as a surgical intervention .  The patient's history has been reviewed, patient examined, no change in status, stable for surgery.  I have reviewed the patients' chart and labs.  Questions were answered to the patient's satisfaction.     Salvatore Marvel A

## 2011-07-24 NOTE — Progress Notes (Signed)
UR COMPLETED  

## 2011-07-25 ENCOUNTER — Encounter (HOSPITAL_COMMUNITY): Payer: Self-pay | Admitting: Orthopedic Surgery

## 2011-07-25 LAB — CBC
HCT: 40.8 % (ref 39.0–52.0)
Hemoglobin: 13.6 g/dL (ref 13.0–17.0)
MCH: 31.1 pg (ref 26.0–34.0)
MCHC: 33.3 g/dL (ref 30.0–36.0)
MCV: 93.4 fL (ref 78.0–100.0)
Platelets: 163 10*3/uL (ref 150–400)
RBC: 4.37 MIL/uL (ref 4.22–5.81)
RDW: 13.8 % (ref 11.5–15.5)
WBC: 7.5 10*3/uL (ref 4.0–10.5)

## 2011-07-25 LAB — URINE CULTURE
Colony Count: NO GROWTH
Culture: NO GROWTH

## 2011-07-25 LAB — BASIC METABOLIC PANEL
BUN: 9 mg/dL (ref 6–23)
CO2: 28 mEq/L (ref 19–32)
Calcium: 8.7 mg/dL (ref 8.4–10.5)
Chloride: 104 mEq/L (ref 96–112)
Creatinine, Ser: 0.76 mg/dL (ref 0.50–1.35)
GFR calc Af Amer: >90 mL/min (ref >90)
GFR calc non Af Amer: 89 mL/min — ABNORMAL LOW (ref >90)
Glucose, Bld: 103 mg/dL — ABNORMAL HIGH (ref 70–99)
Potassium: 4.1 mEq/L (ref 3.5–5.1)
Sodium: 140 mEq/L (ref 135–145)

## 2011-07-25 LAB — GLUCOSE, CAPILLARY: Glucose-Capillary: 120 mg/dL — ABNORMAL HIGH (ref 70–99)

## 2011-07-25 MED ORDER — CELECOXIB 200 MG PO CAPS: 200.0000 mg | ORAL_CAPSULE | Freq: Every day | ORAL | Status: AC

## 2011-07-25 MED ORDER — ENOXAPARIN (LOVENOX) PATIENT EDUCATION KIT
PACK | Freq: Once | Status: AC
  Administered 2011-07-25: 11:00:00
  Filled 2011-07-25: qty 1

## 2011-07-25 MED ORDER — DSS 100 MG PO CAPS: ORAL_CAPSULE | ORAL | Status: DC

## 2011-07-25 MED ORDER — DIAZEPAM 5 MG PO TABS: 5.0000 mg | ORAL_TABLET | Freq: Three times a day (TID) | ORAL | Status: AC | PRN

## 2011-07-25 MED ORDER — ENOXAPARIN SODIUM 30 MG/0.3ML ~~LOC~~ SOLN: 30.0000 mg | Freq: Two times a day (BID) | SUBCUTANEOUS | Status: DC

## 2011-07-25 MED ORDER — OXYCODONE HCL 5 MG PO TABS: ORAL_TABLET | ORAL | Status: DC

## 2011-07-25 MED ORDER — BISACODYL 5 MG PO TBEC: DELAYED_RELEASE_TABLET | ORAL | Status: DC

## 2011-07-25 NOTE — Discharge Summary (Signed)
Patient ID: Todd Diaz MRN: [id number] DOB/AGE: 14-Oct-1940 71 y.o.  Admit date: 07/24/2011 Discharge date: 07/25/2011  Admission Diagnoses:  Principal Problem:  *Avascular necrosis of medial femoral condyle Active Problems:  Hypertension  Diabetes mellitus   Discharge Diagnoses:  Same  Past Medical History  Diagnosis Date  . Hypertension   . Diabetes mellitus   . Avascular necrosis of medial femoral condyle   . Arthritis     Surgeries: Procedure(s): UNICOMPARTMENTAL KNEE on 07/24/2011   Consultants:  none  Discharged Condition: Improved  Hospital Course: Todd Diaz is an 71 y.o. male who was admitted 07/24/2011 for operative treatment ofAvascular necrosis of medial femoral condyle. Patient has severe unremitting pain that affects sleep, daily activities, and work/hobbies. After pre-op clearance the patient was taken to the operating room on 07/24/2011 and underwent  Procedure(s): UNICOMPARTMENTAL KNEE.    Patient was given perioperative antibiotics: Anti-infectives     Start     Dose/Rate Route Frequency Ordered Stop   07/24/11 1400   ceFAZolin (ANCEF) IVPB 2 g/50 mL premix        2 g 100 mL/hr over 30 Minutes Intravenous Every 6 hours 07/24/11 1152 07/24/11 2138   07/24/11 0812   cefUROXime (ZINACEF) injection  Status:  Discontinued          As needed 07/24/11 0812 07/24/11 1000   07/23/11 1237   ceFAZolin (ANCEF) IVPB 2 g/50 mL premix        2 g 100 mL/hr over 30 Minutes Intravenous 60 min pre-op 07/23/11 1237 07/24/11 0735           Patient was given sequential compression devices, early ambulation, and chemoprophylaxis to prevent DVT.  Patient benefited maximally from hospital stay and there were no complications.    Recent vital signs: Patient Vitals for the past 24 hrs:  BP Temp Temp src Pulse Resp SpO2  07/25/11 0516 130/65 mmHg 98.3 F (36.8 C) Oral 62  18  98 %  07/25/11 0400 - - - - 15  98 %  07/25/11 0000 - - - - 14  97 %  07/24/11 2008  136/80 mmHg 97.8 F (36.6 C) Oral 79  18  98 %  07/24/11 2000 - - - - 16  99 %  07/24/11 1600 - - - - 17  96 %  07/24/11 1400 120/84 mmHg 97 F (36.1 C) - 60  18  96 %  07/24/11 1200 - - - - 16  99 %  07/24/11 1150 139/81 mmHg - - 74  14  94 %  07/24/11 1049 - - - 70  13  99 %  07/24/11 1048 - - - 68  14  100 %  07/24/11 1047 - - - 79  19  99 %  07/24/11 1046 - - - 75  19  99 %  07/24/11 1045 - - - 73  15  98 %  07/24/11 1044 - - - 76  16  100 %  07/24/11 1043 - - - 73  14  99 %  07/24/11 1042 - - - 75  16  99 %  07/24/11 1041 - - - 72  14  99 %  07/24/11 1040 129/75 mmHg - - 69  15  99 %  07/24/11 1039 - - - 72  14  98 %  07/24/11 1038 - - - 72  13  99 %  07/24/11 1037 - 97.6 F (36.4 C) - 75  16  98 %  07/24/11 1036 - - - 76  17  99 %  07/24/11 1035 - - - 73  13  99 %  07/24/11 1034 - - - 68  12  98 %  07/24/11 1033 - - - 66  13  99 %  07/24/11 1032 - - - 66  12  98 %  07/24/11 1031 - - - 75  19  99 %  07/24/11 1030 - - - 63  10  98 %  07/24/11 1029 - - - 69  11  99 %  07/24/11 1028 - - - 68  11  99 %  07/24/11 1027 - - - 65  11  99 %  07/24/11 1026 - - - 69  11  100 %  07/24/11 1025 141/79 mmHg - - 79  16  99 %  07/24/11 1024 - - - 70  12  98 %  07/24/11 1023 - - - 75  14  98 %  07/24/11 1022 - - - 71  14  97 %  07/24/11 1021 - - - 80  12  97 %  07/24/11 1020 - - - 78  17  98 %  07/24/11 1019 - - - 78  16  98 %  07/24/11 1018 - - - 78  15  99 %  07/24/11 1017 - - - 76  15  97 %  07/24/11 1016 - - - 83  17  98 %  07/24/11 1015 - - - 79  15  98 %  07/24/11 1014 - - - 83  13  99 %  07/24/11 1013 - - - 85  18  99 %  07/24/11 1012 - - - 84  17  99 %  07/24/11 1011 149/81 mmHg - - 83  13  99 %  07/24/11 1007 - 98.8 F (37.1 C) - 85  14  99 %     Recent laboratory studies:  Basename 07/25/11 0530  WBC 7.5  HGB 13.6  HCT 40.8  PLT 163  NA 140  K 4.1  CL 104  CO2 28  BUN 9  CREATININE 0.76  GLUCOSE 103*  INR --  CALCIUM 8.7     Discharge Medications:     Medication List  As of 07/25/2011  8:03 AM   TAKE these medications         aspirin EC 81 MG tablet   Take 81 mg by mouth daily.      bisacodyl 5 MG EC tablet   Commonly known as: DULCOLAX   2 tab before dinner each night until bowel movement      celecoxib 200 MG capsule   Commonly known as: CELEBREX   Take 1 capsule (200 mg total) by mouth daily.      diazepam 5 MG tablet   Commonly known as: VALIUM   Take 1 tablet (5 mg total) by mouth every 8 (eight) hours as needed for anxiety.      DSS 100 MG Caps   1 tab 2 times a day while on narcotic pain medication.  Stool softener      enoxaparin 30 MG/0.3ML injection   Commonly known as: LOVENOX   Inject 0.3 mLs (30 mg total) into the skin every 12 (twelve) hours.      hydrochlorothiazide 25 MG tablet   Commonly known as: HYDRODIURIL   Take 25 mg by mouth daily.      lisinopril 20 MG tablet   Commonly known  as: PRINIVIL,ZESTRIL   Take 20 mg by mouth daily.      metFORMIN 500 MG tablet   Commonly known as: GLUCOPHAGE   Take 500 mg by mouth 2 (two) times daily with a meal.      niacin 500 MG CR tablet   Commonly known as: NIASPAN   Take 500 mg by mouth at bedtime.      oxyCODONE 5 MG immediate release tablet   Commonly known as: Oxy IR/ROXICODONE   1-2 tab po q 4-6 hrs as needed for pain            Diagnostic Studies: No results found.  Disposition: Stable for discharge  Discharge Orders    Future Orders Please Complete By Expires   Diet - low sodium heart healthy      Call MD / Call 911      Comments:   If you experience chest pain or shortness of breath, CALL 911 and be transported to the hospital emergency room.  If you develope a fever above 101 F, pus (white drainage) or increased drainage or redness at the wound, or calf pain, call your surgeon's office.   Constipation Prevention      Comments:   Drink plenty of fluids.  Prune juice may be helpful.  You may use a stool softener, such as Colace (over the  counter) 100 mg twice a day.  Use MiraLax (over the counter) for constipation as needed.   Increase activity slowly as tolerated      TED hose      Comments:   Use stockings (TED hose) for 2 weeks on both leg(s).  You may remove them at night for sleeping.   Change dressing      Comments:   Change the dressing daily with sterile 4 x 4 inch gauze dressing and apply TED hose.  You may clean the incision with alcohol prior to redressing.   Do not put a pillow under the knee. Place it under the heel.      Comments:   Place yellow foam block, yellow side up under heel at all times except when in CPM or when walking.  DO NOT modify, tear, cut, or change in any way the yellow foam block.   Discharge instructions      Comments:   Partial Knee Replacement Care After Refer to this sheet in the next few weeks. These discharge instructions provide you with general information on caring for yourself after you leave the hospital. Your caregiver may also give you specific instructions. Your treatment has been planned according to the most current medical practices available, but unavoidable complications sometimes occur. If you have any problems or questions after discharge, please call your caregiver. Regaining a near full range of motion of your knee within the first 3 to 6 weeks after surgery is critical. HOME CARE INSTRUCTIONS  You may resume a normal diet and activities as directed.  Perform exercises as directed.  Place yellow foam block, yellow side up under heel at all times except when in CPM or when walking.  DO NOT modify, tear, cut, or change in any way. You will receive physical therapy daily  Take showers instead of baths until informed otherwise.  Change bandages (dressings)daily Do not take over-the-counter or prescription medicines for pain, discomfort, or fever. Eat a well-balanced diet.  Avoid lifting or driving until you are instructed otherwise.  Make an appointment to see your  caregiver for stitches (suture) or staple removal  as directed.  If you have been sent home with a continuous passive motion machine (CPM machine), 0-90 degrees 6 hrs a day   2 hrs a shift SEEK MEDICAL CARE IF: You have swelling of your calf or leg.  You develop shortness of breath or chest pain.  You have redness, swelling, or increasing pain in the wound.  There is pus or any unusual drainage coming from the surgical site.  You notice a bad smell coming from the surgical site or dressing.  The surgical site breaks open after sutures or staples have been removed.  There is persistent bleeding from the suture or staple line.  You are getting worse or are not improving.  You have any other questions or concerns.  SEEK IMMEDIATE MEDICAL CARE IF:  You have a fever.  You develop a rash.  You have difficulty breathing.  You develop any reaction or side effects to medicines given.  Your knee motion is decreasing rather than improving.  MAKE SURE YOU:  Understand these instructions.  Will watch your condition.  Will get help right away if you are not doing well or get worse.      Follow-up Information    Follow up with Nilda Simmer, MD on 07/31/2011. (appt time 2:15)    Contact information:   [facility] [address] [place] [address] [phone]           Signed: Pascal Lux 07/25/2011, 8:03 AM

## 2011-07-25 NOTE — Progress Notes (Addendum)
Discharge instructions given to patient and significant other. Prescriptions given. Dressing change instructions given. Incision care and self-injection materials given. Flossie Dibble, GT[facility], SN Patient and significant other verbalized understanding JWallace, RN [facility] instructor

## 2011-07-25 NOTE — Evaluation (Signed)
Physical Therapy Evaluation Patient Details Name: Todd Diaz MRN: [id number] DOB: September 12, 1940 Today's Date: 07/25/2011 Time: 2130-8657 PT Time Calculation (min): 26 min  PT Assessment / Plan / Recommendation Clinical Impression  Pt s/p L unicompartmental TKA mobilizing well. patient safe to d/c home today as anticpated by MD. Patient with 24/7 assist, HHPT, and use of RW.    PT Assessment  Patient needs continued PT services    Follow Up Recommendations  Home health PT;Supervision/Assistance - 24 hour    Barriers to Discharge None      Equipment Recommendations  Rolling walker with 5" wheels    Recommendations for Other Services     Frequency 7X/week    Precautions / Restrictions Precautions Precautions: Knee Required Braces or Orthoses: Knee Immobilizer - Left Knee Immobilizer - Left: On when out of bed or walking;Discontinue post op day 2 Restrictions Weight Bearing Restrictions: Yes LLE Weight Bearing: Weight bearing as tolerated   Pertinent Vitals/Pain 2/10 L knee       Mobility  Bed Mobility Bed Mobility: Supine to Sit;Sit to Supine Supine to Sit: 6: Modified independent (Device/Increase time);HOB flat Sit to Supine: 6: Modified independent (Device/Increase time);HOB flat Details for Bed Mobility Assistance: able to complete long sit without difficulty Transfers Transfers: Sit to Stand;Stand to Sit Sit to Stand: 5: Supervision;With upper extremity assist;From bed Stand to Sit: 5: Supervision;With upper extremity assist;To chair/3-in-1;With armrests Details for Transfer Assistance: v/c's for safe hand placement/walker management Ambulation/Gait Ambulation/Gait Assistance: 4: Min guard Ambulation Distance (Feet): 100 Feet Assistive device: Rolling walker Ambulation/Gait Assistance Details: v/c's to increase L LE WBing, decrease UE WBing, fluid step-to gait pattern Gait Pattern: Step-to pattern;Decreased step length - left;Decreased stance time -  left;Antalgic Gait velocity: wfl for surgery Stairs: Yes Stairs Assistance: 4: Min guard Stair Management Technique: One rail Left;Sideways Number of Stairs: 4  Wheelchair Mobility Wheelchair Mobility: No    Exercises Total Joint Exercises Ankle Circles/Pumps: PROM;Both;10 reps Quad Sets: AROM;Left;10 reps;Supine Heel Slides: AROM;Left;10 reps;Supine Goniometric ROM: 85 deg active L knee flex   PT Diagnosis: Difficulty walking;Acute pain  PT Problem List: Decreased strength;Decreased range of motion;Decreased activity tolerance;Decreased mobility PT Treatment Interventions: DME instruction;Gait training;Stair training;Functional mobility training;Therapeutic activities;Therapeutic exercise   PT Goals Acute Rehab PT Goals PT Goal Formulation: With patient Time For Goal Achievement: 08/01/11 Potential to Achieve Goals: Good Pt will go Supine/Side to Sit: Independently;with HOB 0 degrees PT Goal: Supine/Side to Sit - Progress: Goal set today Pt will go Sit to Supine/Side: with modified independence;with HOB 0 degrees PT Goal: Sit to Supine/Side - Progress: Goal set today Pt will go Sit to Stand: with modified independence;with upper extremity assist (up to RW.) PT Goal: Sit to Stand - Progress: Goal set today Pt will Ambulate: >150 feet;with modified independence;with least restrictive assistive device PT Goal: Ambulate - Progress: Goal set today Pt will Go Up / Down Stairs: 1-2 stairs;with supervision;with rail(s) PT Goal: Up/Down Stairs - Progress: Goal set today Pt will Perform Home Exercise Program: Independently PT Goal: Perform Home Exercise Program - Progress: Goal set today  Visit Information  Last PT Received On: 07/25/11 Assistance Needed: +1    Subjective Data  Subjective: Pt received supine in bed with report of 2/10 L knee pain.   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 Communication Communication: No difficulties Dominant Hand: Right    Cognition  Overall Cognitive Status: Appears within functional limits for tasks assessed/performed Arousal/Alertness: Awake/alert Orientation Level: Appears intact for tasks assessed Behavior During Session: [facility] for tasks performed    Extremity/Trunk Assessment Right Upper Extremity Assessment RUE ROM/Strength/Tone: Within functional levels Left Upper Extremity Assessment LUE ROM/Strength/Tone: Within functional levels Right Lower Extremity Assessment RLE ROM/Strength/Tone: Within functional levels Left Lower Extremity Assessment LLE ROM/Strength/Tone: Deficits;Due to pain LLE ROM/Strength/Tone Deficits: pt with good quad set, able to achieve 85 deg active knee flex Trunk Assessment Trunk Assessment: Normal   Balance    End of Session PT - End of Session Equipment Utilized During Treatment: Gait belt;Left knee immobilizer Activity Tolerance: Patient tolerated treatment well Patient left: in bed;with call bell/phone within reach Nurse Communication: Mobility status CPM Left Knee CPM Left Knee: Off  GP     Marcene Brawn 07/25/2011, 9:54 AM  Lewis Shock, PT, DPT Pager #: [phone] Office #: [phone]

## 2011-07-25 NOTE — Care Management Note (Signed)
    Page 1 of 1   07/25/2011     9:56:30 AM   CARE MANAGEMENT NOTE 07/25/2011  Patient:  HOBERT, POPLASKI   Account Number:  [id number]  Date Initiated:  07/24/2011  Documentation initiated by:  Anette Guarneri  Subjective/Objective Assessment:   Operative day for left TKA  [facility] services and DME needed     Action/Plan:   home with [facility] services   Anticipated DC Date:  07/25/2011   Anticipated DC Plan:  HOME W HOME HEALTH SERVICES      DC Planning Services  CM consult      [facility] Choice  HOME HEALTH   Choice offered to / List presented to:             Status of service:  Completed, signed off Medicare Important Message given?   (If response is "NO", the following Medicare IM given date fields will be blank) Date Medicare IM given:   Date Additional Medicare IM given:    Discharge Disposition:  HOME W HOME HEALTH SERVICES  Per UR Regulation:  Reviewed for med. necessity/level of care/duration of stay  If discussed at Long Length of Stay Meetings, dates discussed:    Comments:  07/25/11 09:54  Anette Guarneri RN/CM Per patient, he has RW, Patient agreeable with [facility] for HHPT/OT. Contacted [facility] and informed Mary/[facility] of discharge today.   07/24/11 15:51 Anette Guarneri RN/CM [facility] services pre-arranged by MD office with [facility]

## 2011-07-25 NOTE — Progress Notes (Signed)
Occupational Therapy Evaluation Patient Details Name: Todd Diaz MRN: [id number] DOB: Nov 12, 1940 Today's Date: 07/25/2011 Time: 1020-1039 OT Time Calculation (min): 19 min  OT Assessment / Plan / Recommendation Clinical Impression       OT Assessment       Follow Up Recommendations  No OT follow up    Barriers to Discharge      Equipment Recommendations  Rolling walker with 5" wheels    Recommendations for Other Services    Frequency       Precautions / Restrictions Precautions Precautions: Knee Required Braces or Orthoses: Knee Immobilizer - Left Knee Immobilizer - Left: On when out of bed or walking;Discontinue post op day 2 Restrictions Weight Bearing Restrictions: Yes LLE Weight Bearing: Weight bearing as tolerated   Pertinent Vitals/Pain 3    ADL  Upper Body Bathing: Simulated;Modified independent Lower Body Bathing: Simulated;Minimal assistance Where Assessed - Lower Body Bathing: Unsupported sit to stand Upper Body Dressing: Simulated;Modified independent Lower Body Dressing: Performed;Minimal assistance Where Assessed - Lower Body Dressing: Unsupported sit to stand Toilet Transfer: Simulated;Modified independent Toilet Transfer Method: Sit to Barista: Comfort height toilet Toileting - Clothing Manipulation and Hygiene: Performed;Modified independent Where Assessed - Toileting Clothing Manipulation and Hygiene: Standing Tub/Shower Transfer: Engineer, manufacturing Method: Science writer: Counsellor Used: Sports coach walker;Sock aid Transfers/Ambulation Related to ADLs: mod I ADL Comments: Educated pt on available AE for ADL and using tub bench for transfers    OT Diagnosis:    OT Problem List:   OT Treatment Interventions:     OT Goals Acute Rehab OT Goals OT Goal Formulation:  (eval only)  Visit Information  Last OT Received On:  07/25/11 Assistance Needed: +1    Subjective Data      Prior Functioning  Vision/Perception  Home Living Lives With: Significant other Available Help at Discharge: Family;Available 24 hours/day Type of Home: House Home Access: Stairs to enter [employer] of Steps: 2 Entrance Stairs-Rails: Right;Left (can't reach both) Home Layout: One level Bathroom Shower/Tub: Engineer, manufacturing systems: Handicapped height Bathroom Accessibility: Yes How Accessible: Accessible via walker Home Adaptive Equipment: Walker - rolling Prior Function Level of Independence: Independent Able to Take Stairs?: Yes Driving: Yes 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, research (medical) Communication: No difficulties Dominant Hand: Right      Cognition  Overall Cognitive Status: Appears within functional limits for tasks assessed/performed Arousal/Alertness: Awake/alert Orientation Level: Appears intact for tasks assessed Behavior During Session: [facility] for tasks performed    Extremity/Trunk Assessment Right Upper Extremity Assessment RUE ROM/Strength/Tone: Within functional levels Left Upper Extremity Assessment LUE ROM/Strength/Tone: Within functional levels Right Lower Extremity Assessment RLE ROM/Strength/Tone: Within functional levels Left Lower Extremity Assessment LLE ROM/Strength/Tone: Deficits;Due to pain LLE ROM/Strength/Tone Deficits: pt with good quad set, able to achieve 85 deg active knee flex Trunk Assessment Trunk Assessment: Normal   Mobility Bed Mobility Bed Mobility: Supine to Sit;Sit to Supine Supine to Sit: 6: Modified independent (Device/Increase time);HOB flat Sit to Supine: 6: Modified independent (Device/Increase time);HOB flat Details for Bed Mobility Assistance: able to complete long sit without difficulty Transfers Transfers: Sit to Stand;Stand to Sit Sit to Stand: 6: Modified independent (Device/Increase time) Stand to Sit: 6: Modified  independent (Device/Increase time) Details for Transfer Assistance: vc for safe transfer technique during tub bench transfer   Exercise Total Joint Exercises Ankle Circles/Pumps: PROM;Both;10 reps Quad Sets: AROM;Left;10 reps;Supine Heel Slides: AROM;Left;10 reps;Supine Goniometric ROM: 85 deg active L knee  flex  Balance Balance Balance Assessed:  (WFL)  End of Session OT - End of Session Equipment Utilized During Treatment: Gait belt Activity Tolerance: Patient tolerated treatment well Patient left: in bed;with call bell/phone within reach;with nursing in room CPM Left Knee CPM Left Knee: Off  GO     Arely Tinner,HILLARY 07/25/2011, 10:53 AM Luisa Dago, OTR/L  [phone] 07/25/2011

## 2011-07-25 NOTE — Op Note (Signed)
NAMEANTONIOS, Todd Diaz                ACCOUNT NO.:  [id number]  MEDICAL RECORD NO.:  [id number]  LOCATION:  5N32C                        FACILITY:  [facility]  PHYSICIAN:  Dwyane Dupree A. Thurston Hole, M.D. DATE OF BIRTH:  April 24, 1940  DATE OF PROCEDURE:  07/24/2011 DATE OF DISCHARGE:                              OPERATIVE REPORT   PREOPERATIVE DIAGNOSIS:  Left knee medial compartment degenerative joint disease with avascular necrosis.  POSTOPERATIVE DIAGNOSIS:  Left knee medial compartment degenerative joint disease with avascular necrosis.  PROCEDURE: 1. Left knee medial compartment unicompartmental Oxford arthroplasty     using size D left tibial tray with medium femoral component with #4     poly insert. 2. Zinacef impregnated cement.  SURGEON:  Elana Alm. Thurston Hole, MD  ASSISTANT:  Eulas Post, MD and Julien Girt, PA-C  ANESTHESIA:  General.  OPERATIVE TIME:  2 hours.  COMPLICATIONS:  None.  DESCRIPTION OF PROCEDURE:  Todd Diaz was brought to the operating room on July 24, 2011, placed on the operative table in a supine position. He received Ancef 2 g IV preoperatively for prophylaxis.  After being placed under general anesthesia, had a Foley catheter placed under sterile conditions.  His left knee was examined.  He had full range of motion, mild varus deformity, knee stable, ligamentous exam with normal patellar tracking.  Left leg was prepped using sterile DuraPrep and draped using sterile technique.  Time-out procedure was called and the correct left knee identified.  Left leg was exsanguinated and a thigh tourniquet elevated to 365 mm.  Initially, through a 10-cm anteromedial incision just medial to the patellar tendon, initial exposure was made. The underlying subcutaneous tissues were incised along with skin incision.  A median arthrotomy was performed revealing an excessive amount of normal-appearing joint fluid.  The articular surfaces were inspected.  He had grade 3 and  4 DJD medially, grade 2 and 3 changes in the patellofemoral joint, and minimal degenerative changes laterally. The ACL/PCL were intact.  At this point, the sizing spoon was placed in the medial compartment after the anterior portion of medial meniscus was removed.  The tibial drill guide was placed in the appropriate position and pinned using the sizing spoon and then both a vertical cut was made followed by the horizontal cut to resect the appropriate amount of proximal tibia.  The piece was removed and size found to be a size D. The size D tray was placed, found to be in excellent fit.  At this point, the intramedullary drill was drilled up the femoral canal for placement of the femoral intramedullary rod and then this was used and linked to the femoral drill guide with a size 4 in place with excellent fit and then 2 drill holes were made in the central portion of the femoral condyle and then this apparatus was removed and then the 0 spigot was placed and reamed.  After this was done, then with the tibial base plate trial in place and the medium femoral trial, it was found to be an excellent size for a size 4 in flexion, but no gap in extension and thus the #4 spigot was replaced on the femur and reamed.  After this was done with the femoral trial and tibial trial in place and a #4 paddle both in 90 degrees of flexion and 20 degrees of flexion, there was found to be excellent sizing of this.  After this was done, then the tibial base plate keel cut was made with the toothbrush saw and found to be in excellent fit and then the impingement reamer was placed on the femur and both the anterior-posterior impingement reaming and osteotome were used.  After this was done, again the trials were placed with a medium femoral trial, a D tibial base plate, and a #4 insert, found to give excellent stability, excellent correction of his flexion deformity with full range of motion, and normal patellar  tracking.  At this point, these trial components were removed.  The knee was jet lavaged with 3 L of saline and then the proximal tibia was exposed and the size D tibial base plate with cement backing was hammered in position with an excellent fit, with excess cement being removed from the edges.  The medium femoral component was hammered into position also with an excellent fit with excess cement being removed from around the edges. The #4 poly insert was placed with an excellent fit.  Knee taken through a full range of motion, found to be stable with excellent excursion and excellent stability of the poly insert.  After this was done, the wound was irrigated again and then the arthrotomy was closed with 0-Vicryl, #1 Ethibond, and V-Loc suture over a medium Hemovac drain.  Subcutaneous tissues closed 0 and 2-0 Vicryl, subcuticular layer closed with 4-0 Monocryl.  Sterile dressings were applied.  Tourniquet was released. The patient then awakened, extubated, and taken to the recovery room in a stable condition.  Needle and sponge counts correct x2 at the end of the case.  FOLLOWUP:  Todd Diaz will be followed as an inpatient, weightbearing as tolerated.  He will be discharged when stable.     Muriel Wilber A. Thurston Hole, M.D.     RAW/MEDQ  D:  07/24/2011  T:  07/25/2011  Job:  [id number]

## 2011-11-01 ENCOUNTER — Ambulatory Visit (INDEPENDENT_AMBULATORY_CARE_PROVIDER_SITE_OTHER): Payer: Medicare PPO | Admitting: Family Medicine

## 2011-11-01 ENCOUNTER — Encounter: Payer: Self-pay | Admitting: Family Medicine

## 2011-11-01 VITALS — BP 133/73 | HR 69 | Temp 98.0°F | Resp 16 | Ht 70.5 in | Wt 207.0 lb

## 2011-11-01 DIAGNOSIS — N529 Male erectile dysfunction, unspecified: Secondary | ICD-10-CM

## 2011-11-01 DIAGNOSIS — E119 Type 2 diabetes mellitus without complications: Secondary | ICD-10-CM

## 2011-11-01 DIAGNOSIS — E7800 Pure hypercholesterolemia, unspecified: Secondary | ICD-10-CM

## 2011-11-01 DIAGNOSIS — I10 Essential (primary) hypertension: Secondary | ICD-10-CM

## 2011-11-01 LAB — CBC WITH DIFFERENTIAL/PLATELET
Basophils Absolute: 0 10*3/uL (ref 0.0–0.1)
Basophils Relative: 0 % (ref 0–1)
Eosinophils Absolute: 0.1 10*3/uL (ref 0.0–0.7)
Eosinophils Relative: 2 % (ref 0–5)
HCT: 46.8 % (ref 39.0–52.0)
Hemoglobin: 16 g/dL (ref 13.0–17.0)
Lymphocytes Relative: 30 % (ref 12–46)
Lymphs Abs: 1.8 10*3/uL (ref 0.7–4.0)
MCH: 30.3 pg (ref 26.0–34.0)
MCHC: 34.2 g/dL (ref 30.0–36.0)
MCV: 88.6 fL (ref 78.0–100.0)
Monocytes Absolute: 0.5 10*3/uL (ref 0.1–1.0)
Monocytes Relative: 9 % (ref 3–12)
Neutro Abs: 3.6 10*3/uL (ref 1.7–7.7)
Neutrophils Relative %: 59 % (ref 43–77)
Platelets: 227 10*3/uL (ref 150–400)
RBC: 5.28 MIL/uL (ref 4.22–5.81)
RDW: 14.2 % (ref 11.5–15.5)
WBC: 6 10*3/uL (ref 4.0–10.5)

## 2011-11-01 LAB — POCT GLYCOSYLATED HEMOGLOBIN (HGB A1C): Hemoglobin A1C: 5.6

## 2011-11-01 NOTE — Progress Notes (Signed)
[address]   [place], [place]  [address]   [phone]  Subjective:    Patient ID: Todd Diaz, male    DOB: 11-24-1940, 71 y.o.   MRN: [id number]  HPIThis 71 y.o. male presents to establish care and for three month follow-up:  1.  L partial knee replacement:  Surgery 07/24/11 by Thurston Hole.  S/p PT with improvement.  Playing golf pain free.  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 Failed cortizone injection.    2.  DMII:  110-130s.  Taking Metformin; no side effects.  S/p flu vaccine by Dr. Darrel Reach.  Denies polyuria, polydipsia.    3.  HTN:  Does not check blood pressure at home; checks at [facility]; runs 126/80.   No chest pain, palpitations, shortness of breath, leg swelling.  Compliance with medication.  4.  Hyperlipidemia:   Non-fasting today; beef tips on rice.  Beets, butter beans.  Iced tea sweet.    5.  Impotence: Medication works well.    6. BPH: followed by Dr. Achilles Dunk; sees yearly.  Stone in bladder.  Stones in prostate.  Feels good now.   Review of Systems  Constitutional: Negative for fever, chills, diaphoresis and fatigue.  Respiratory: Negative for cough, shortness of breath, wheezing and stridor.   Cardiovascular: Negative for chest pain, palpitations and leg swelling.  Gastrointestinal: Negative for nausea, vomiting, abdominal pain, diarrhea and constipation.  Genitourinary: Negative for frequency, hematuria and flank pain.  Musculoskeletal: Positive for arthralgias.  Skin: Negative for rash and wound.    Past Medical History  Diagnosis Date  . Hypertension   . Diabetes mellitus   . Avascular necrosis of medial femoral condyle   . Arthritis     Past Surgical History  Procedure Date  . Prostate surgery   . Bladder stone removal   . Partial knee arthroplasty 07/24/2011    Procedure: UNICOMPARTMENTAL KNEE;  Surgeon: Nilda Simmer, MD;  Location: [facility];  Service: Orthopedics;  Laterality: Left;    Prior to Admission  medications   Medication Sig Start Date End Date Taking? Authorizing Provider  aspirin EC 81 MG tablet Take 81 mg by mouth daily.   Yes Historical Provider, MD  hydrochlorothiazide (HYDRODIURIL) 25 MG tablet Take 25 mg by mouth daily.   Yes Historical Provider, MD  metFORMIN (GLUCOPHAGE) 500 MG tablet Take 500 mg by mouth 2 (two) times daily with a meal.   Yes Historical Provider, MD  niacin (NIASPAN) 500 MG CR tablet Take 500 mg by mouth at bedtime.   Yes Historical Provider, MD  bisacodyl (DULCOLAX) 5 MG EC tablet 2 tab before dinner each night until bowel movement 07/25/11   Kirstin J Shepperson, PA  docusate sodium 100 MG CAPS 1 tab 2 times a day while on narcotic pain medication.  Stool softener 07/25/11   Kirstin J Shepperson, PA  enoxaparin (LOVENOX) 30 MG/0.3ML injection Inject 0.3 mLs (30 mg total) into the skin every 12 (twelve) hours. 07/25/11   Kirstin J Shepperson, PA  lisinopril (PRINIVIL,ZESTRIL) 20 MG tablet take 1 tablet by mouth once daily 11/14/11   Morrell Riddle, PA-C  oxyCODONE (OXY IR/ROXICODONE) 5 MG immediate release tablet 1-2 tab po q 4-6 hrs as needed for pain 07/25/11   Kirstin J Shepperson, PA    No Known Allergies  History   Social History  . Marital Status: Widowed    Spouse Name: N/A    Number of Children: N/A  .  Years of Education: N/A   Occupational History  . Not on file.   Social History Main Topics  . Smoking status: Former Smoker    Quit date: 01/10/1976  . Smokeless tobacco: Not on file  . Alcohol Use: Yes  . Drug Use: No  . Sexually Active: Not on file   Other Topics Concern  . Not on file   Social History Narrative  . No narrative on file    Family History  Problem Relation Age of Onset  . Hypertension Mother   . Cancer Mother   . Heart attack Father   . Hypertension Father         Objective:   Physical Exam  Nursing note and vitals reviewed. Constitutional: He is oriented to person, place, and time. He appears well-developed  and well-nourished. No distress.  HENT:  Mouth/Throat: Oropharynx is clear and moist.  Eyes: Conjunctivae normal are normal. Pupils are equal, round, and reactive to light.  Neck: Normal range of motion. Neck supple. No JVD present. No thyromegaly present.  Cardiovascular: Normal rate, regular rhythm, normal heart sounds and intact distal pulses.  Exam reveals no gallop and no friction rub.   No murmur heard. Pulmonary/Chest: Effort normal and breath sounds normal. He has no wheezes. He has no rales.  Abdominal: Soft. Bowel sounds are normal. He exhibits no distension and no mass. There is no tenderness. There is no rebound and no guarding.  Lymphadenopathy:    He has no cervical adenopathy.  Neurological: He is alert and oriented to person, place, and time. No cranial nerve deficit. He exhibits normal muscle tone. Coordination normal.  Skin: Skin is warm and dry. No rash noted. He is not diaphoretic. No erythema.  Psychiatric: He has a normal mood and affect. His behavior is normal. Judgment and thought content normal.       Assessment & Plan:   1. Essential hypertension, benign  CBC with Differential, CK  2. Type II or unspecified type diabetes mellitus without mention of complication, not stated as uncontrolled  CK, HgB A1c  3. Pure hypercholesterolemia  Comprehensive metabolic panel, Lipid panel  4. Impotence    5.  OA knee   1. HTN: controlled; no change in medication; obtain labs. 2.  DMII: controlled; no change in medication; obtain labs. 3.  Hypercholesterolemia: stable; obtain labs; continue current medication. 4.  Impotence:  Improved with medication. 5.  OA Knee:  Improved with TKR.

## 2011-11-01 NOTE — Patient Instructions (Addendum)
1. Essential hypertension, benign  CBC with Differential, CK  2. Type II or unspecified type diabetes mellitus without mention of complication, not stated as uncontrolled  CK, HgB A1c  3. Pure hypercholesterolemia  Comprehensive metabolic panel, Lipid panel  4. Impotence

## 2011-11-02 LAB — LIPID PANEL
Cholesterol: 173 mg/dL (ref 0–200)
HDL: 30 mg/dL — ABNORMAL LOW (ref >39)
LDL Cholesterol: 106 mg/dL — ABNORMAL HIGH (ref 0–99)
Total CHOL/HDL Ratio: 5.8 Ratio
Triglycerides: 184 mg/dL — ABNORMAL HIGH (ref <150)
VLDL: 37 mg/dL (ref 0–40)

## 2011-11-02 LAB — COMPREHENSIVE METABOLIC PANEL
ALT: 21 U/L (ref 0–53)
AST: 23 U/L (ref 0–37)
Albumin: 4.5 g/dL (ref 3.5–5.2)
Alkaline Phosphatase: 78 U/L (ref 39–117)
BUN: 12 mg/dL (ref 6–23)
CO2: 29 mEq/L (ref 19–32)
Calcium: 9.8 mg/dL (ref 8.4–10.5)
Chloride: 102 mEq/L (ref 96–112)
Creat: 1.14 mg/dL (ref 0.50–1.35)
Glucose, Bld: 83 mg/dL (ref 70–99)
Potassium: 4 mEq/L (ref 3.5–5.3)
Sodium: 139 mEq/L (ref 135–145)
Total Bilirubin: 0.3 mg/dL (ref 0.3–1.2)
Total Protein: 7.3 g/dL (ref 6.0–8.3)

## 2011-11-02 LAB — CK: Total CK: 224 U/L (ref 7–232)

## 2011-11-14 ENCOUNTER — Other Ambulatory Visit: Payer: Self-pay | Admitting: Family Medicine

## 2011-11-14 NOTE — Telephone Encounter (Signed)
Patient girlfriend states [facility] [place] is not getting a response from [place] regarding a refill request for lisinopril. Please call pt to advise [facility] [phone]

## 2011-12-26 DIAGNOSIS — I10 Essential (primary) hypertension: Secondary | ICD-10-CM | POA: Insufficient documentation

## 2011-12-26 DIAGNOSIS — E119 Type 2 diabetes mellitus without complications: Secondary | ICD-10-CM | POA: Insufficient documentation

## 2011-12-26 DIAGNOSIS — E1169 Type 2 diabetes mellitus with other specified complication: Secondary | ICD-10-CM | POA: Insufficient documentation

## 2011-12-26 DIAGNOSIS — E785 Hyperlipidemia, unspecified: Secondary | ICD-10-CM | POA: Insufficient documentation

## 2011-12-26 DIAGNOSIS — N529 Male erectile dysfunction, unspecified: Secondary | ICD-10-CM | POA: Insufficient documentation

## 2012-01-05 NOTE — Progress Notes (Signed)
Reviewed and agree.

## 2012-02-06 ENCOUNTER — Other Ambulatory Visit: Payer: Self-pay | Admitting: Family Medicine

## 2012-02-06 NOTE — Telephone Encounter (Signed)
Pt is requesting a refill for viagra called in to [facility] on [address] in [place]

## 2012-02-21 ENCOUNTER — Ambulatory Visit: Payer: Medicare PPO | Admitting: Family Medicine

## 2012-03-09 HISTORY — PX: OTHER SURGICAL HISTORY: SHX169

## 2012-03-25 ENCOUNTER — Encounter: Payer: Self-pay | Admitting: Family Medicine

## 2012-03-25 ENCOUNTER — Ambulatory Visit (INDEPENDENT_AMBULATORY_CARE_PROVIDER_SITE_OTHER): Payer: Medicare PPO | Admitting: Family Medicine

## 2012-03-25 VITALS — BP 140/88 | HR 60 | Temp 97.5°F | Resp 18 | Ht 71.25 in | Wt 214.8 lb

## 2012-03-25 DIAGNOSIS — I10 Essential (primary) hypertension: Secondary | ICD-10-CM

## 2012-03-25 DIAGNOSIS — E119 Type 2 diabetes mellitus without complications: Secondary | ICD-10-CM

## 2012-03-25 DIAGNOSIS — N529 Male erectile dysfunction, unspecified: Secondary | ICD-10-CM

## 2012-03-25 DIAGNOSIS — N210 Calculus in bladder: Secondary | ICD-10-CM | POA: Insufficient documentation

## 2012-03-25 DIAGNOSIS — E7800 Pure hypercholesterolemia, unspecified: Secondary | ICD-10-CM

## 2012-03-25 LAB — CBC WITH DIFFERENTIAL/PLATELET
Basophils Absolute: 0 10*3/uL (ref 0.0–0.1)
Basophils Relative: 0 % (ref 0–1)
Eosinophils Absolute: 0.1 10*3/uL (ref 0.0–0.7)
Eosinophils Relative: 2 % (ref 0–5)
HCT: 47.6 % (ref 39.0–52.0)
Hemoglobin: 16.7 g/dL (ref 13.0–17.0)
Lymphocytes Relative: 25 % (ref 12–46)
Lymphs Abs: 1.5 10*3/uL (ref 0.7–4.0)
MCH: 30.6 pg (ref 26.0–34.0)
MCHC: 35.1 g/dL (ref 30.0–36.0)
MCV: 87.3 fL (ref 78.0–100.0)
Monocytes Absolute: 0.4 10*3/uL (ref 0.1–1.0)
Monocytes Relative: 8 % (ref 3–12)
Neutro Abs: 3.7 10*3/uL (ref 1.7–7.7)
Neutrophils Relative %: 65 % (ref 43–77)
Platelets: 202 10*3/uL (ref 150–400)
RBC: 5.45 MIL/uL (ref 4.22–5.81)
RDW: 14.2 % (ref 11.5–15.5)
WBC: 5.7 10*3/uL (ref 4.0–10.5)

## 2012-03-25 LAB — COMPREHENSIVE METABOLIC PANEL
ALT: 27 U/L (ref 0–53)
AST: 29 U/L (ref 0–37)
Albumin: 4.3 g/dL (ref 3.5–5.2)
Alkaline Phosphatase: 78 U/L (ref 39–117)
BUN: 12 mg/dL (ref 6–23)
CO2: 29 mEq/L (ref 19–32)
Calcium: 9.9 mg/dL (ref 8.4–10.5)
Chloride: 99 mEq/L (ref 96–112)
Creat: 0.76 mg/dL (ref 0.50–1.35)
Glucose, Bld: 113 mg/dL — ABNORMAL HIGH (ref 70–99)
Potassium: 4.6 mEq/L (ref 3.5–5.3)
Sodium: 136 mEq/L (ref 135–145)
Total Bilirubin: 0.4 mg/dL (ref 0.3–1.2)
Total Protein: 6.9 g/dL (ref 6.0–8.3)

## 2012-03-25 LAB — LIPID PANEL
Cholesterol: 211 mg/dL — ABNORMAL HIGH (ref 0–200)
HDL: 28 mg/dL — ABNORMAL LOW (ref >39)
Total CHOL/HDL Ratio: 7.5 Ratio
Triglycerides: 593 mg/dL — ABNORMAL HIGH (ref <150)

## 2012-03-25 LAB — HEMOGLOBIN A1C
Hgb A1c MFr Bld: 6.1 % — ABNORMAL HIGH (ref <5.7)
Mean Plasma Glucose: 128 mg/dL — ABNORMAL HIGH (ref <117)

## 2012-03-25 LAB — CK: Total CK: 279 U/L — ABNORMAL HIGH (ref 7–232)

## 2012-03-25 MED ORDER — NIACIN ER (ANTIHYPERLIPIDEMIC) 500 MG PO TBCR: 500.0000 mg | EXTENDED_RELEASE_TABLET | Freq: Every day | ORAL | Status: DC

## 2012-03-25 MED ORDER — SILDENAFIL CITRATE 100 MG PO TABS: 100.0000 mg | ORAL_TABLET | Freq: Every day | ORAL | Status: DC | PRN

## 2012-03-25 MED ORDER — METFORMIN HCL 500 MG PO TABS: 500.0000 mg | ORAL_TABLET | Freq: Two times a day (BID) | ORAL | Status: DC

## 2012-03-25 MED ORDER — HYDROCHLOROTHIAZIDE 25 MG PO TABS: 25.0000 mg | ORAL_TABLET | Freq: Every day | ORAL | Status: DC

## 2012-03-25 MED ORDER — LISINOPRIL 20 MG PO TABS: 20.0000 mg | ORAL_TABLET | Freq: Every day | ORAL | Status: DC

## 2012-03-25 NOTE — Progress Notes (Signed)
[address]   [place], [place]  [address]   [phone]  Subjective:    Patient ID: Todd Diaz, male    DOB: 1940-09-13, 72 y.o.   MRN: [id number]  HPI This 72 y.o. male presents for evaluation of the following:  1.  DMII: compliance with Metformin; ran out of medication yesterday.  Checks blood sugar once weekly; running 120-130.  Will suffer with low sugar once per month; shaking.  Can occur with playing golf.  Last eye exam one year ago; sees [facility] in [place].    2.  HTN:  Not checking blood pressure at home.  No recent BP checks at home.  No chest pain, palpitations, SOB, leg swelling.  Reports good compliance with medication; good tolerance to medication; good symptom control.  3.  Hyperlipidemia: five month follow-up; no changes to management made at last visit.   Compliance with Niaspan; good tolerance to medication; good symptom control.  4. OA Knee:  S/p follow-up with Thurston Hole; cleared.  Follow-up PRN.    5.  BPH:  No recent follow-up with [facility]; due to see [facility].  Needs to talk with [facility].  6.  Impotence: five month follow-up; Viagra working well; no side effects to medication.   Review of Systems  Constitutional: Negative for fever, chills, diaphoresis and fatigue.  Respiratory: Negative for cough, shortness of breath and wheezing.   Cardiovascular: Negative for chest pain, palpitations and leg swelling.  Gastrointestinal: Negative for nausea, vomiting, abdominal pain, diarrhea and constipation.  Skin: Negative for color change, pallor, rash and wound.  Neurological: Negative for dizziness, syncope, facial asymmetry, speech difficulty, weakness, light-headedness, numbness and headaches.        Past Medical History  Diagnosis Date  . Hypertension   . Diabetes mellitus   . Avascular necrosis of medial femoral condyle   . Hyperlipidemia   . Arthritis     Knees  . Erectile dysfunction     Past Surgical History  Procedure Laterality Date  . Prostate surgery      . Bladder stone removal    . Partial knee arthroplasty  07/24/2011    Procedure: UNICOMPARTMENTAL KNEE;  Surgeon: Nilda Simmer, MD;  Location: [facility] OR;  Service: Orthopedics;  Laterality: Left;    Prior to Admission medications   Medication Sig Start Date End Date Taking? Authorizing Provider  aspirin EC 81 MG tablet Take 81 mg by mouth daily.   Yes Historical Provider, MD  hydrochlorothiazide (HYDRODIURIL) 25 MG tablet Take 1 tablet (25 mg total) by mouth daily. 03/25/12  Yes Ethelda Chick, MD  lisinopril (PRINIVIL,ZESTRIL) 20 MG tablet Take 1 tablet (20 mg total) by mouth daily. 03/25/12  Yes Ethelda Chick, MD  metFORMIN (GLUCOPHAGE) 500 MG tablet Take 1 tablet (500 mg total) by mouth 2 (two) times daily with a meal. 03/25/12  Yes Ethelda Chick, MD  niacin (NIASPAN) 500 MG CR tablet Take 1 tablet (500 mg total) by mouth at bedtime. 03/25/12  Yes Ethelda Chick, MD  sildenafil (VIAGRA) 100 MG tablet Take 1 tablet (100 mg total) by mouth daily as needed for erectile dysfunction. 03/25/12  Yes Ethelda Chick, MD  bisacodyl (DULCOLAX) 5 MG EC tablet 2 tab before dinner each night until bowel movement 07/25/11   Kirstin J Shepperson, PA-C  docusate sodium 100 MG CAPS 1 tab 2 times a day while on narcotic pain medication.  Stool softener 07/25/11   Kirstin J Shepperson, PA-C  enoxaparin (LOVENOX) 30 MG/0.3ML  injection Inject 0.3 mLs (30 mg total) into the skin every 12 (twelve) hours. 07/25/11   Kirstin J Shepperson, PA-C  oxyCODONE (OXY IR/ROXICODONE) 5 MG immediate release tablet 1-2 tab po q 4-6 hrs as needed for pain 07/25/11   Kirstin J Shepperson, PA-C    No Known Allergies  History   Social History  . Marital Status: Widowed    Spouse Name: N/A    Number of Children: N/A  . Years of Education: N/A   Occupational History  . Not on file.   Social History Main Topics  . Smoking status: Former Smoker    Quit date: 01/10/1976  . Smokeless tobacco: Not on file  . Alcohol Use: Yes  .  Drug Use: No  . Sexually Active: Yes    Birth Control/ Protection: None   Other Topics Concern  . Not on file   Social History Narrative  . No narrative on file    Family History  Problem Relation Age of Onset  . Hypertension Mother   . Cancer Mother   . Heart attack Father   . Hypertension Father     Objective:   Physical Exam  Nursing note and vitals reviewed. Constitutional: He is oriented to person, place, and time. He appears well-developed and well-nourished. No distress.  HENT:  Mouth/Throat: Oropharynx is clear and moist.  Eyes: Conjunctivae and EOM are normal. Pupils are equal, round, and reactive to light.  Neck: Normal range of motion. No JVD present. No thyromegaly present.  Cardiovascular: Normal rate, regular rhythm, normal heart sounds and intact distal pulses.  Exam reveals no gallop and no friction rub.   No murmur heard. Pulmonary/Chest: Effort normal and breath sounds normal. He has no wheezes. He has no rales.  Abdominal: Soft. Bowel sounds are normal. He exhibits no distension. There is no tenderness. There is no rebound and no guarding.  Lymphadenopathy:    He has no cervical adenopathy.  Neurological: He is alert and oriented to person, place, and time. No cranial nerve deficit. He exhibits normal muscle tone. Coordination normal.  Skin: Skin is warm and dry. No rash noted. He is not diaphoretic. No erythema. No pallor.  Psychiatric: He has a normal mood and affect. His behavior is normal. Judgment and thought content normal.       Assessment & Plan:  Type II or unspecified type diabetes mellitus without mention of complication, not stated as uncontrolled - Plan: Comprehensive metabolic panel, Hemoglobin A1c, metFORMIN (GLUCOPHAGE) 500 MG tablet, Ambulatory referral to Ophthalmology  Pure hypercholesterolemia - Plan: Lipid panel, niacin (NIASPAN) 500 MG CR tablet  Essential hypertension, benign - Plan: CBC with Differential, CK, lisinopril  (PRINIVIL,ZESTRIL) 20 MG tablet, hydrochlorothiazide (HYDRODIURIL) 25 MG tablet  Impotence - Plan: sildenafil (VIAGRA) 100 MG tablet  Bladder stone - Plan: Ambulatory referral to Urology   Meds ordered this encounter  Medications  . sildenafil (VIAGRA) 100 MG tablet    Sig: Take 1 tablet (100 mg total) by mouth daily as needed for erectile dysfunction.    Dispense:  5 tablet    Refill:  5  . niacin (NIASPAN) 500 MG CR tablet    Sig: Take 1 tablet (500 mg total) by mouth at bedtime.    Dispense:  30 tablet    Refill:  5  . metFORMIN (GLUCOPHAGE) 500 MG tablet    Sig: Take 1 tablet (500 mg total) by mouth 2 (two) times daily with a meal.    Dispense:  60 tablet  Refill:  5  . lisinopril (PRINIVIL,ZESTRIL) 20 MG tablet    Sig: Take 1 tablet (20 mg total) by mouth daily.    Dispense:  30 tablet    Refill:  5  . hydrochlorothiazide (HYDRODIURIL) 25 MG tablet    Sig: Take 1 tablet (25 mg total) by mouth daily.    Dispense:  30 tablet    Refill:  5

## 2012-03-25 NOTE — Assessment & Plan Note (Signed)
Controlled; obtain labs; refill provided; refer to Dr. Clydene Pugh of ophthalmology.

## 2012-03-25 NOTE — Assessment & Plan Note (Signed)
Moderately controlled; no changes to management at this time; advised to check BP once weekly and to call if remains > 140/90.  Obtain labs.  Refills provided.

## 2012-03-25 NOTE — Assessment & Plan Note (Signed)
Stable; refer back to [facility] for annual exam.

## 2012-03-25 NOTE — Assessment & Plan Note (Signed)
Moderately controlled; obtain labs; refill provided.  Recommend seven pound weight loss in upcoming four months.

## 2012-03-25 NOTE — Patient Instructions (Addendum)
Type II or unspecified type diabetes mellitus without mention of complication, not stated as uncontrolled - Plan: Comprehensive metabolic panel, Hemoglobin A1c, metFORMIN (GLUCOPHAGE) 500 MG tablet  Pure hypercholesterolemia - Plan: Lipid panel, niacin (NIASPAN) 500 MG CR tablet  Essential hypertension, benign - Plan: CBC with Differential, CK, lisinopril (PRINIVIL,ZESTRIL) 20 MG tablet, hydrochlorothiazide (HYDRODIURIL) 25 MG tablet  Impotence - Plan: sildenafil (VIAGRA) 100 MG tablet

## 2012-03-25 NOTE — Assessment & Plan Note (Signed)
Controlled; refill of Viagra provided.

## 2012-04-07 LAB — COMPREHENSIVE METABOLIC PANEL
Albumin: 3.8 g/dL (ref 3.4–5.0)
Alkaline Phosphatase: 107 U/L (ref 50–136)
Anion Gap: 3 — ABNORMAL LOW (ref 7–16)
BUN: 13 mg/dL (ref 7–18)
Bilirubin,Total: 0.2 mg/dL (ref 0.2–1.0)
Calcium, Total: 9.2 mg/dL (ref 8.5–10.1)
Chloride: 103 mmol/L (ref 98–107)
Co2: 32 mmol/L (ref 21–32)
Creatinine: 0.96 mg/dL (ref 0.60–1.30)
EGFR (African American): >60
EGFR (Non-African Amer.): >60
Glucose: 129 mg/dL — ABNORMAL HIGH (ref 65–99)
Osmolality: 277 (ref 275–301)
Potassium: 4.3 mmol/L (ref 3.5–5.1)
SGOT(AST): 41 U/L — ABNORMAL HIGH (ref 15–37)
SGPT (ALT): 39 U/L (ref 12–78)
Sodium: 138 mmol/L (ref 136–145)
Total Protein: 7.8 g/dL (ref 6.4–8.2)

## 2012-04-07 LAB — CBC
HCT: 46 % (ref 40.0–52.0)
HGB: 15.9 g/dL (ref 13.0–18.0)
MCH: 31.2 pg (ref 26.0–34.0)
MCHC: 34.7 g/dL (ref 32.0–36.0)
MCV: 90 fL (ref 80–100)
Platelet: 197 10*3/uL (ref 150–440)
RBC: 5.1 10*6/uL (ref 4.40–5.90)
RDW: 13.7 % (ref 11.5–14.5)
WBC: 7.6 10*3/uL (ref 3.8–10.6)

## 2012-04-07 LAB — CK TOTAL AND CKMB (NOT AT ARMC)
CK, Total: 587 U/L — ABNORMAL HIGH (ref 35–232)
CK-MB: 14.2 ng/mL — ABNORMAL HIGH (ref 0.5–3.6)

## 2012-04-07 LAB — TROPONIN I: Troponin-I: <0.02 ng/mL

## 2012-04-08 ENCOUNTER — Observation Stay: Payer: Self-pay | Admitting: Family Medicine

## 2012-04-08 DIAGNOSIS — I359 Nonrheumatic aortic valve disorder, unspecified: Secondary | ICD-10-CM

## 2012-04-08 LAB — CK TOTAL AND CKMB (NOT AT ARMC)
CK, Total: 472 U/L — ABNORMAL HIGH (ref 35–232)
CK, Total: 357 U/L — ABNORMAL HIGH (ref 35–232)
CK-MB: 11.8 ng/mL — ABNORMAL HIGH (ref 0.5–3.6)
CK-MB: 9.2 ng/mL — ABNORMAL HIGH (ref 0.5–3.6)

## 2012-04-08 LAB — URINALYSIS, COMPLETE
Bacteria: NONE SEEN
Bilirubin,UR: NEGATIVE
Glucose,UR: NEGATIVE mg/dL (ref 0–75)
Ketone: NEGATIVE
Leukocyte Esterase: NEGATIVE
Nitrite: NEGATIVE
Ph: 5 (ref 4.5–8.0)
Protein: NEGATIVE
RBC,UR: 1 /HPF (ref 0–5)
Specific Gravity: 1.012 (ref 1.003–1.030)
Squamous Epithelial: <1
WBC UR: 1 /HPF (ref 0–5)

## 2012-04-09 LAB — BASIC METABOLIC PANEL
Anion Gap: 6 — ABNORMAL LOW (ref 7–16)
BUN: 10 mg/dL (ref 7–18)
Calcium, Total: 9.3 mg/dL (ref 8.5–10.1)
Chloride: 104 mmol/L (ref 98–107)
Co2: 30 mmol/L (ref 21–32)
Creatinine: 0.86 mg/dL (ref 0.60–1.30)
EGFR (African American): >60
EGFR (Non-African Amer.): >60
Glucose: 109 mg/dL — ABNORMAL HIGH (ref 65–99)
Osmolality: 279 (ref 275–301)
Potassium: 4.2 mmol/L (ref 3.5–5.1)
Sodium: 140 mmol/L (ref 136–145)

## 2012-04-09 LAB — CBC WITH DIFFERENTIAL/PLATELET
Basophil #: 0 10*3/uL (ref 0.0–0.1)
Basophil %: 0.6 %
Eosinophil #: 0.2 10*3/uL (ref 0.0–0.7)
Eosinophil %: 2.6 %
HCT: 47.1 % (ref 40.0–52.0)
HGB: 16.3 g/dL (ref 13.0–18.0)
Lymphocyte #: 1.6 10*3/uL (ref 1.0–3.6)
Lymphocyte %: 27 %
MCH: 31.4 pg (ref 26.0–34.0)
MCHC: 34.6 g/dL (ref 32.0–36.0)
MCV: 91 fL (ref 80–100)
Monocyte #: 0.6 x10 3/mm (ref 0.2–1.0)
Monocyte %: 10.5 %
Neutrophil #: 3.6 10*3/uL (ref 1.4–6.5)
Neutrophil %: 59.3 %
Platelet: 183 10*3/uL (ref 150–440)
RBC: 5.19 10*6/uL (ref 4.40–5.90)
RDW: 14 % (ref 11.5–14.5)
WBC: 6.1 10*3/uL (ref 3.8–10.6)

## 2012-04-09 LAB — CK: CK, Total: 238 U/L — ABNORMAL HIGH (ref 35–232)

## 2012-04-09 LAB — LIPID PANEL
Cholesterol: 177 mg/dL (ref 0–200)
HDL Cholesterol: 30 mg/dL — ABNORMAL LOW (ref 40–60)
Ldl Cholesterol, Calc: 102 mg/dL — ABNORMAL HIGH (ref 0–100)
Triglycerides: 226 mg/dL — ABNORMAL HIGH (ref 0–200)
VLDL Cholesterol, Calc: 45 mg/dL — ABNORMAL HIGH (ref 5–40)

## 2012-04-09 LAB — CK-MB: CK-MB: 5.7 ng/mL — ABNORMAL HIGH (ref 0.5–3.6)

## 2012-04-22 ENCOUNTER — Ambulatory Visit: Payer: Medicare PPO | Admitting: Family Medicine

## 2012-05-06 ENCOUNTER — Ambulatory Visit: Payer: Medicare PPO | Admitting: Family Medicine

## 2012-05-15 ENCOUNTER — Telehealth: Payer: Self-pay

## 2012-05-15 ENCOUNTER — Other Ambulatory Visit: Payer: Self-pay | Admitting: Family Medicine

## 2012-05-15 NOTE — Telephone Encounter (Signed)
Dr Katrinka Blazing, pt's chol was high at last OV (triglycerides very high) but he had not been fasting. I just wanted to check w/you to make sure you don't want to increase his dose of Crestor.

## 2012-05-15 NOTE — Telephone Encounter (Signed)
Call --- I received refill request from pharmacy for Crestor.  I do not have Crestor on pt's medication list.  Has he been taking it?  When was it prescribed and by whom?

## 2012-05-15 NOTE — Telephone Encounter (Signed)
What dose does he take? This is not on his list.

## 2012-05-15 NOTE — Telephone Encounter (Signed)
Dr. Katrinka Blazing - patient needs a refill of crestor.  He uses [facility] on [address] [place] [address]. [place].  The patients call back number is [phone]

## 2012-05-15 NOTE — Telephone Encounter (Signed)
See notes under Rx request.

## 2012-05-16 NOTE — Telephone Encounter (Signed)
Pt CB and clarified that he was put on the Crestor by the doctor at [facility] hosp when he was an inpatient and there are no more RFs on it. He states that the Lipitor that he used to be on "made him hurt", but he has been doing fine on the Crestor. Dr Katrinka Blazing, can we give him RFs?

## 2012-05-16 NOTE — Telephone Encounter (Signed)
LMOM for pt to CB to verify Crestor Rx.

## 2012-05-17 NOTE — Telephone Encounter (Signed)
LMOM for pt that RFs were sent to pharmacy. Asked for CB to give more info about his hospitalization.

## 2012-05-17 NOTE — Telephone Encounter (Signed)
I will refill Crestor.  When was patient in hospital and placed on Crestor?

## 2012-05-20 ENCOUNTER — Encounter: Payer: Self-pay | Admitting: Family Medicine

## 2012-05-20 ENCOUNTER — Ambulatory Visit (INDEPENDENT_AMBULATORY_CARE_PROVIDER_SITE_OTHER): Payer: Medicare PPO | Admitting: Family Medicine

## 2012-05-20 VITALS — BP 136/72 | HR 90 | Temp 98.3°F | Resp 16 | Ht 69.5 in | Wt 205.0 lb

## 2012-05-20 DIAGNOSIS — E119 Type 2 diabetes mellitus without complications: Secondary | ICD-10-CM

## 2012-05-20 DIAGNOSIS — I10 Essential (primary) hypertension: Secondary | ICD-10-CM

## 2012-05-20 DIAGNOSIS — E7800 Pure hypercholesterolemia, unspecified: Secondary | ICD-10-CM

## 2012-05-20 DIAGNOSIS — G459 Transient cerebral ischemic attack, unspecified: Secondary | ICD-10-CM

## 2012-05-20 DIAGNOSIS — F410 Panic disorder [episodic paroxysmal anxiety] without agoraphobia: Secondary | ICD-10-CM

## 2012-05-20 LAB — COMPREHENSIVE METABOLIC PANEL
ALT: 29 U/L (ref 0–53)
AST: 32 U/L (ref 0–37)
Albumin: 4.8 g/dL (ref 3.5–5.2)
Alkaline Phosphatase: 77 U/L (ref 39–117)
BUN: 10 mg/dL (ref 6–23)
CO2: 25 mEq/L (ref 19–32)
Calcium: 10.3 mg/dL (ref 8.4–10.5)
Chloride: 105 mEq/L (ref 96–112)
Creat: 0.84 mg/dL (ref 0.50–1.35)
Glucose, Bld: 93 mg/dL (ref 70–99)
Potassium: 3.8 mEq/L (ref 3.5–5.3)
Sodium: 139 mEq/L (ref 135–145)
Total Bilirubin: 0.3 mg/dL (ref 0.3–1.2)
Total Protein: 7.6 g/dL (ref 6.0–8.3)

## 2012-05-20 LAB — CBC WITH DIFFERENTIAL/PLATELET
Basophils Absolute: 0 10*3/uL (ref 0.0–0.1)
Basophils Relative: 0 % (ref 0–1)
Eosinophils Absolute: 0.1 10*3/uL (ref 0.0–0.7)
Eosinophils Relative: 2 % (ref 0–5)
HCT: 46.1 % (ref 39.0–52.0)
Hemoglobin: 16.6 g/dL (ref 13.0–17.0)
Lymphocytes Relative: 26 % (ref 12–46)
Lymphs Abs: 1.7 10*3/uL (ref 0.7–4.0)
MCH: 31.4 pg (ref 26.0–34.0)
MCHC: 36 g/dL (ref 30.0–36.0)
MCV: 87.1 fL (ref 78.0–100.0)
Monocytes Absolute: 0.6 10*3/uL (ref 0.1–1.0)
Monocytes Relative: 9 % (ref 3–12)
Neutro Abs: 4.2 10*3/uL (ref 1.7–7.7)
Neutrophils Relative %: 63 % (ref 43–77)
Platelets: 220 10*3/uL (ref 150–400)
RBC: 5.29 MIL/uL (ref 4.22–5.81)
RDW: 14.1 % (ref 11.5–15.5)
WBC: 6.6 10*3/uL (ref 4.0–10.5)

## 2012-05-20 LAB — CK: Total CK: 356 U/L — ABNORMAL HIGH (ref 7–232)

## 2012-05-20 LAB — LIPID PANEL
Cholesterol: 179 mg/dL (ref 0–200)
HDL: 33 mg/dL — ABNORMAL LOW (ref >39)
LDL Cholesterol: 103 mg/dL — ABNORMAL HIGH (ref 0–99)
Total CHOL/HDL Ratio: 5.4 Ratio
Triglycerides: 214 mg/dL — ABNORMAL HIGH (ref <150)
VLDL: 43 mg/dL — ABNORMAL HIGH (ref 0–40)

## 2012-05-20 MED ORDER — DIAZEPAM 5 MG PO TABS: ORAL_TABLET | ORAL | Status: DC

## 2012-05-20 NOTE — Progress Notes (Signed)
[address]   [place], [place]  [address]   [phone]  Subjective:    Patient ID: Todd Diaz, male    DOB: Jun 03, 1940, 72 y.o.   MRN: [id number]  HPI This 72 y.o. male presents for TRANSITION INTO CARE.    1.  TIA: admitted to [facility] for one day.  Onset of numbness, tingling R foot, up to knee.  No weakness.    S/p CT head abnormal.  Admitted to perform MRI brain, carotid dopplers, 2D-echo, CT neck angio.   Not sure if received neurology consultation.  Started on Crestor for hyperlipidemia.  Cannot tolerate Lipitor; last trial four years ago.  No headache, dizziness, chest pain, SOB, slurred speech, dysphagia.  Continue ASA 81mg  daily.Discussed increasing ASA but never did.  Tingling and numbness in RLE lasted 24 hours and then resolved.  No lumbar xrays performed; no back pain at all.   2.  Carotid stenosis:  Carotid stenosis 50% B.  Recommended starting sttain.  3.  HTN:  Blood pressure elevated upon admission.  BP 167 systolic.  Checking BP at drug store weekly; once weekly.  4.  Hypercholesterolemia: ate strawberry pie for lunch; ate sandwich for breakfast.  Tolerating Crestor 5mg  daily.   5.  Panick attacks:  Occurs once per month; can happen at other times.  Requesting rx.  Not sure what is triggering panic attacks.   Review of Systems  Constitutional: Negative for fever, chills, diaphoresis, activity change, appetite change, fatigue and unexpected weight change.  HENT: Negative for neck pain and neck stiffness.   Eyes: Negative for photophobia, pain, discharge, redness, itching and visual disturbance.  Respiratory: Negative for shortness of breath, wheezing and stridor.   Cardiovascular: Negative for chest pain, palpitations and leg swelling.  Gastrointestinal: Negative for nausea, vomiting, abdominal pain, diarrhea and abdominal distention.  Endocrine: Negative for cold intolerance, heat intolerance, polydipsia, polyphagia and polyuria.  Skin: Negative for color change,  pallor, rash and wound.  Neurological: Positive for numbness. Negative for dizziness, tremors, seizures, syncope, facial asymmetry, speech difficulty, weakness, light-headedness and headaches.  Psychiatric/Behavioral: Negative for suicidal ideas, sleep disturbance and self-injury. The patient is nervous/anxious.     Past Medical History  Diagnosis Date  . Hypertension   . Diabetes mellitus   . Avascular necrosis of medial femoral condyle   . Hyperlipidemia   . Arthritis     Knees  . Erectile dysfunction   . Hypertrophy of prostate without urinary obstruction and other lower urinary tract symptoms (LUTS)   . Chronic prostatitis   . Hearing loss   . Infection of urinary tract     ASSESSMENT:Recurrent MRSA UTI F/B Dr. Leavy Cella and Dr. Achilles Dunk  . Tobacco use disorder     Past Surgical History  Procedure Laterality Date  . Prostate surgery    . Bladder stone removal    . Partial knee arthroplasty  07/24/2011    Procedure: UNICOMPARTMENTAL KNEE;  Surgeon: Nilda Simmer, MD;  Location: [facility] OR;  Service: Orthopedics;  Laterality: Left;  . Amputation  2009    L fifth finger surgery for near amputation. [facility].    Prior to Admission medications   Medication Sig Start Date End Date Taking? Authorizing Provider  aspirin EC 81 MG tablet Take 81 mg by mouth daily.   Yes Historical Provider, MD  CRESTOR 5 MG tablet take 1 tablet by mouth once daily 05/15/12  Yes Ethelda Chick, MD  hydrochlorothiazide (HYDRODIURIL) 25 MG tablet Take 1 tablet (25  mg total) by mouth daily. 03/25/12  Yes Ethelda Chick, MD  lisinopril (PRINIVIL,ZESTRIL) 20 MG tablet Take 1 tablet (20 mg total) by mouth daily. 03/25/12  Yes Ethelda Chick, MD  metFORMIN (GLUCOPHAGE) 500 MG tablet Take 1 tablet (500 mg total) by mouth 2 (two) times daily with a meal. 03/25/12  Yes Ethelda Chick, MD  niacin (NIASPAN) 500 MG CR tablet Take 1 tablet (500 mg total) by mouth at bedtime. 03/25/12  Yes Ethelda Chick, MD  sildenafil (VIAGRA) 100  MG tablet Take 1 tablet (100 mg total) by mouth daily as needed for erectile dysfunction. 03/25/12  Yes Ethelda Chick, MD  diazepam (VALIUM) 5 MG tablet 1/2 to 1 po with onset of panic attack 05/20/12   Ethelda Chick, MD    No Known Allergies  History   Social History  . Marital Status: Widowed    Spouse Name: N/A    Number of Children: 4  . Years of Education: 12   Occupational History  . 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 Smoking status: Former Smoker -- 1.00 packs/day for 20 years    Types: Cigarettes    Quit date: 01/10/1976  . Smokeless tobacco: Not on file     Comment: Quit in the 1990's uses someless tobacco  . Alcohol Use: Yes     Comment: occasional 12 beers per weekend night.  DUI in 2000.  . Drug Use: No  . Sexually Active: Yes    Birth Control/ Protection: None   Other Topics Concern  . Not on file   Social History Narrative   Widowed since 02/2009 after 44 years second marriage; engaged in 01/2011.   Four children, 5 grandchildren, 1 gg.   Always uses seat belts, Smoke alarm in the home, >5 guns in the home loaded and locked up.   Caffeine UXL:KGMWNU, Tea, Carbonated beverages; consumes a moderate amount.   Exercise: Inactive.   LIVING WILL; FULL CODE, No HCPOA.    Family History  Problem Relation Age of Onset  . Hypertension Mother   . Cancer Mother   . Heart attack Father   . Hypertension Father   . Hyperlipidemia Father   . Aneurysm Father 83    Brain  . Diabetes Son        Objective:   Physical Exam  Nursing note and vitals reviewed. Constitutional: He is oriented to person, place, and time. He appears well-developed and well-nourished. No distress.  HENT:  Head: Normocephalic and atraumatic.  Right Ear: External ear normal.  Left Ear: External ear normal.  Nose: Nose normal.  Mouth/Throat: Oropharynx is clear and moist.  Eyes: Conjunctivae and EOM are normal. Pupils are equal, round, and  reactive to light.  Neck: Normal range of motion. Neck supple. No JVD present. No thyromegaly present.  Cardiovascular: Normal rate, regular rhythm, normal heart sounds and intact distal pulses.  Exam reveals no gallop and no friction rub.   No murmur heard. Pulmonary/Chest: Effort normal and breath sounds normal. He has no wheezes. He has no rales.  Lymphadenopathy:    He has no cervical adenopathy.  Neurological: He is alert and oriented to person, place, and time. He has normal reflexes. No cranial nerve deficit. He exhibits normal muscle tone.  Skin: Skin is warm and dry. No rash noted. He is not diaphoretic. No erythema. No pallor.  Psychiatric: He has a  normal mood and affect. His behavior is normal. Judgment and thought content normal.          Assessment & Plan:  TIA (transient ischemic attack)  Essential hypertension, benign  Hypertension  Pure hypercholesterolemia  Type II or unspecified type diabetes mellitus without mention of complication, not stated as uncontrolled  Panic attack   Meds ordered this encounter  Medications  . diazepam (VALIUM) 5 MG tablet    Sig: 1/2 to 1 po with onset of panic attack    Dispense:  20 tablet    Refill:  0

## 2012-06-13 ENCOUNTER — Encounter: Payer: Self-pay | Admitting: *Deleted

## 2012-07-05 ENCOUNTER — Encounter: Payer: Self-pay | Admitting: Family Medicine

## 2012-07-05 DIAGNOSIS — G459 Transient cerebral ischemic attack, unspecified: Secondary | ICD-10-CM | POA: Insufficient documentation

## 2012-07-05 NOTE — Assessment & Plan Note (Signed)
Controlled; obtain labs; continue current medications. 

## 2012-07-05 NOTE — Assessment & Plan Note (Signed)
Uncontrolled; tolerating Crestor 5mg  well; obtain labs.  No change in medication at this time.

## 2012-07-05 NOTE — Assessment & Plan Note (Signed)
New. Obtain [facility] records; normal neurological exam in office; recommend increasing ASA to 325mg  daily. Warrants aggressive control of blood pressure, lipids, glucose.  Pt expressed understanding.

## 2012-07-05 NOTE — Assessment & Plan Note (Signed)
Controlled now; no change in medication; obtain labs; continue to monitor at home.

## 2012-07-05 NOTE — Assessment & Plan Note (Signed)
New. Occurs once per month on average; rx for Valium to use PRN.

## 2012-07-08 ENCOUNTER — Encounter: Payer: Medicare PPO | Admitting: Family Medicine

## 2012-09-04 ENCOUNTER — Ambulatory Visit (INDEPENDENT_AMBULATORY_CARE_PROVIDER_SITE_OTHER): Payer: Medicare PPO | Admitting: Family Medicine

## 2012-09-04 ENCOUNTER — Encounter: Payer: Self-pay | Admitting: Family Medicine

## 2012-09-04 VITALS — BP 128/88 | HR 60 | Temp 98.0°F | Resp 16 | Ht 69.5 in | Wt 208.0 lb

## 2012-09-04 DIAGNOSIS — I10 Essential (primary) hypertension: Secondary | ICD-10-CM

## 2012-09-04 DIAGNOSIS — F410 Panic disorder [episodic paroxysmal anxiety] without agoraphobia: Secondary | ICD-10-CM

## 2012-09-04 DIAGNOSIS — E7800 Pure hypercholesterolemia, unspecified: Secondary | ICD-10-CM

## 2012-09-04 DIAGNOSIS — E119 Type 2 diabetes mellitus without complications: Secondary | ICD-10-CM

## 2012-09-04 DIAGNOSIS — G459 Transient cerebral ischemic attack, unspecified: Secondary | ICD-10-CM

## 2012-09-04 LAB — CBC WITH DIFFERENTIAL/PLATELET
Basophils Absolute: 0 10*3/uL (ref 0.0–0.1)
Basophils Relative: 0 % (ref 0–1)
Eosinophils Absolute: 0.2 10*3/uL (ref 0.0–0.7)
Eosinophils Relative: 3 % (ref 0–5)
HCT: 46.9 % (ref 39.0–52.0)
Hemoglobin: 16.1 g/dL (ref 13.0–17.0)
Lymphocytes Relative: 31 % (ref 12–46)
Lymphs Abs: 1.8 10*3/uL (ref 0.7–4.0)
MCH: 30.3 pg (ref 26.0–34.0)
MCHC: 34.3 g/dL (ref 30.0–36.0)
MCV: 88.3 fL (ref 78.0–100.0)
Monocytes Absolute: 0.7 10*3/uL (ref 0.1–1.0)
Monocytes Relative: 11 % (ref 3–12)
Neutro Abs: 3.4 10*3/uL (ref 1.7–7.7)
Neutrophils Relative %: 55 % (ref 43–77)
Platelets: 195 10*3/uL (ref 150–400)
RBC: 5.31 MIL/uL (ref 4.22–5.81)
RDW: 14.1 % (ref 11.5–15.5)
WBC: 6 10*3/uL (ref 4.0–10.5)

## 2012-09-04 NOTE — Progress Notes (Signed)
[address]   [place], [place]  [address]   [phone]  Subjective:    Patient ID: Todd Diaz, male    DOB: [birth year], 72 y.o.   MRN: [id number]  HPI This 72 y.o. male presents for three month follow-up:  1.  Hyperlipidemia: no changes to management made at last visit. Good tolerance to Crestor and Niaspan.  Not fasting today.  Ate sandwich at 12:00pm; hamburger, tea.    2. DMII: three month follow-up; no changes to management made at last visit; reports good tolerance to Metformin, good compliance to Metformin. Sugars running 110.  3.  HTN: three month follow-up; no changes to management made at last visit; BP at [facility] runs 120s/80s.  Reports good compliance with medications, good tolerance to medications, good symptom control.  4.  TIA: new recurrent neurological symptoms; taking ASA 81mg  bid.  5.  Panic attacks: no recurrence since last visit; taking Valium sparingly; had similar panic attacks after death of wife.  Now very happy with new girlfriend; emotionally stable. Doing well.    Review of Systems  Constitutional: Negative for fever, chills, diaphoresis and fatigue.  Respiratory: Negative for cough, shortness of breath, wheezing and stridor.   Cardiovascular: Negative for chest pain, palpitations and leg swelling.  Gastrointestinal: Negative for nausea, vomiting, abdominal pain and diarrhea.  Endocrine: Negative for cold intolerance, heat intolerance, polydipsia, polyphagia and polyuria.  Neurological: Negative for dizziness, tremors, seizures, syncope, facial asymmetry, speech difficulty, weakness, light-headedness, numbness and headaches.  Psychiatric/Behavioral: Negative for sleep disturbance and dysphoric mood. The patient is not nervous/anxious.     Past Medical History  Diagnosis Date  . Hypertension   . Diabetes mellitus   . Avascular necrosis of medial femoral condyle   . Hyperlipidemia   . Arthritis     Knees  . Erectile dysfunction   . Hypertrophy  of prostate without urinary obstruction and other lower urinary tract symptoms (LUTS)   . Chronic prostatitis   . Hearing loss   . Infection of urinary tract     ASSESSMENT:Recurrent MRSA UTI F/B Dr. Leavy Cella and Dr. Achilles Dunk  . Tobacco use disorder     Past Surgical History  Procedure Laterality Date  . Prostate surgery    . Bladder stone removal    . Partial knee arthroplasty  07/24/2011    Procedure: UNICOMPARTMENTAL KNEE;  Surgeon: Nilda Simmer, MD;  Location: [facility] OR;  Service: Orthopedics;  Laterality: Left;  . Amputation  2009    L fifth finger surgery for near amputation. [place].  . Admission  03/09/2012    [facility]; 24 hour admission; LE numbness/tingling.  CT head, MRI brain, carotid dopplers, 2D-echo.    Prior to Admission medications   Medication Sig Start Date End Date Taking? Authorizing Provider  aspirin EC 81 MG tablet Take 81 mg by mouth daily.   Yes Historical Provider, MD  CRESTOR 5 MG tablet take 1 tablet by mouth once daily 05/15/12  Yes Ethelda Chick, MD  diazepam (VALIUM) 5 MG tablet 1/2 to 1 po with onset of panic attack 05/20/12  Yes Ethelda Chick, MD  hydrochlorothiazide (HYDRODIURIL) 25 MG tablet Take 1 tablet (25 mg total) by mouth daily. 03/25/12  Yes Ethelda Chick, MD  lisinopril (PRINIVIL,ZESTRIL) 20 MG tablet Take 1 tablet (20 mg total) by mouth daily. 03/25/12  Yes Ethelda Chick, MD  metFORMIN (GLUCOPHAGE) 500 MG tablet Take 1 tablet (500 mg total) by mouth 2 (two) times daily with a meal.  03/25/12  Yes Ethelda Chick, MD  sildenafil (VIAGRA) 100 MG tablet Take 1 tablet (100 mg total) by mouth daily as needed for erectile dysfunction. 03/25/12  Yes Ethelda Chick, MD  niacin (NIASPAN) 500 MG CR tablet Take 1 tablet (500 mg total) by mouth at bedtime. 03/25/12   Ethelda Chick, MD    No Known Allergies  History   Social History  . Marital Status: Widowed    Spouse Name: N/A    Number of Children: 4  . Years of Education: 12   Occupational History  . 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 Smoking status: Former Smoker -- 1.00 packs/day for 20 years    Types: Cigarettes    Quit date: 01/10/1976  . Smokeless tobacco: Not on file     Comment: Quit in the 1990's uses someless tobacco  . Alcohol Use: Yes     Comment: occasional 12 beers per weekend night.  DUI in 2000.  . Drug Use: No  . Sexual Activity: Yes    Birth Control/ Protection: None   Other Topics Concern  . Not on file   Social History Narrative   Widowed since 02/2009 after 44 years second marriage; engaged in 01/2011.   Four children, 5 grandchildren, 1 gg.   Always uses seat belts, Smoke alarm in the home, >5 guns in the home loaded and locked up.   Caffeine ZOX:WRUEAV, Tea, Carbonated beverages; consumes a moderate amount.   Exercise: Inactive.   LIVING WILL; FULL CODE, No HCPOA.    Family History  Problem Relation Age of Onset  . Hypertension Mother   . Cancer Mother   . Heart attack Father   . Hypertension Father   . Hyperlipidemia Father   . Aneurysm Father 57    Brain  . Diabetes Son        Objective:   Physical Exam  Nursing note and vitals reviewed. Constitutional: He is oriented to person, place, and time. He appears well-developed and well-nourished. No distress.  HENT:  Head: Normocephalic and atraumatic.  Right Ear: External ear normal.  Left Ear: External ear normal.  Nose: Nose normal.  Mouth/Throat: Oropharynx is clear and moist.  Eyes: Conjunctivae and EOM are normal. Pupils are equal, round, and reactive to light.  Neck: Normal range of motion. Neck supple. No JVD present. No thyromegaly present.  Cardiovascular: Normal rate, regular rhythm, normal heart sounds and intact distal pulses.  Exam reveals no gallop and no friction rub.   No murmur heard. +PVCs  Pulmonary/Chest: Effort normal and breath sounds normal. No respiratory distress. He has no wheezes. He has no rales.  Abdominal: Soft. Bowel  sounds are normal. He exhibits no distension. There is no tenderness. There is no rebound and no guarding.  Lymphadenopathy:    He has no cervical adenopathy.  Neurological: He is alert and oriented to person, place, and time. No cranial nerve deficit. He exhibits normal muscle tone. Coordination normal.  Skin: Skin is warm and dry. No rash noted. He is not diaphoretic. No erythema.  Diffuse scaling B feet.  Psychiatric: He has a normal mood and affect. His behavior is normal.       Assessment & Plan:  Essential hypertension, benign - Plan: Comprehensive metabolic panel  Panic attack  Pure hypercholesterolemia - Plan: CK, Comprehensive metabolic panel, Lipid panel  TIA (transient ischemic attack)  Type II or  unspecified type diabetes mellitus without mention of complication, not stated as uncontrolled - Plan: CBC with Differential, Comprehensive metabolic panel, Hemoglobin A1c   1. HTN: controlled; no change in management; obtain labs. 2.  Hypercholesterolemia: moderately controlled; obtain labs; continue current medication. 3.  DMII: controlled; obtain labs; continue current medication. 4.  TIA: stable; no recurrence; continue ASA 81mg  bid.   No orders of the defined types were placed in this encounter.

## 2012-09-05 LAB — LIPID PANEL
Cholesterol: 188 mg/dL (ref 0–200)
HDL: 35 mg/dL — ABNORMAL LOW (ref >39)
LDL Cholesterol: 112 mg/dL — ABNORMAL HIGH (ref 0–99)
Total CHOL/HDL Ratio: 5.4 Ratio
Triglycerides: 207 mg/dL — ABNORMAL HIGH (ref <150)
VLDL: 41 mg/dL — ABNORMAL HIGH (ref 0–40)

## 2012-09-05 LAB — COMPREHENSIVE METABOLIC PANEL
ALT: 31 U/L (ref 0–53)
AST: 31 U/L (ref 0–37)
Albumin: 4.6 g/dL (ref 3.5–5.2)
Alkaline Phosphatase: 77 U/L (ref 39–117)
BUN: 13 mg/dL (ref 6–23)
CO2: 29 mEq/L (ref 19–32)
Calcium: 10 mg/dL (ref 8.4–10.5)
Chloride: 102 mEq/L (ref 96–112)
Creat: 0.93 mg/dL (ref 0.50–1.35)
Glucose, Bld: 82 mg/dL (ref 70–99)
Potassium: 4.3 mEq/L (ref 3.5–5.3)
Sodium: 138 mEq/L (ref 135–145)
Total Bilirubin: 0.3 mg/dL (ref 0.3–1.2)
Total Protein: 7.3 g/dL (ref 6.0–8.3)

## 2012-09-05 LAB — HEMOGLOBIN A1C
Hgb A1c MFr Bld: 6.2 % — ABNORMAL HIGH (ref <5.7)
Mean Plasma Glucose: 131 mg/dL — ABNORMAL HIGH (ref <117)

## 2012-09-05 LAB — CK: Total CK: 327 U/L — ABNORMAL HIGH (ref 7–232)

## 2012-09-26 ENCOUNTER — Other Ambulatory Visit: Payer: Self-pay | Admitting: Family Medicine

## 2012-11-17 ENCOUNTER — Other Ambulatory Visit: Payer: Self-pay | Admitting: Family Medicine

## 2012-12-11 ENCOUNTER — Encounter: Payer: Self-pay | Admitting: Family Medicine

## 2012-12-11 ENCOUNTER — Ambulatory Visit (INDEPENDENT_AMBULATORY_CARE_PROVIDER_SITE_OTHER): Payer: Medicare PPO | Admitting: Family Medicine

## 2012-12-11 VITALS — BP 111/75 | HR 71 | Temp 97.8°F | Resp 16 | Ht 71.0 in | Wt 208.6 lb

## 2012-12-11 DIAGNOSIS — Z23 Encounter for immunization: Secondary | ICD-10-CM

## 2012-12-11 DIAGNOSIS — F410 Panic disorder [episodic paroxysmal anxiety] without agoraphobia: Secondary | ICD-10-CM

## 2012-12-11 DIAGNOSIS — E119 Type 2 diabetes mellitus without complications: Secondary | ICD-10-CM

## 2012-12-11 DIAGNOSIS — E7800 Pure hypercholesterolemia, unspecified: Secondary | ICD-10-CM

## 2012-12-11 DIAGNOSIS — I10 Essential (primary) hypertension: Secondary | ICD-10-CM

## 2012-12-11 DIAGNOSIS — G459 Transient cerebral ischemic attack, unspecified: Secondary | ICD-10-CM

## 2012-12-11 DIAGNOSIS — E1059 Type 1 diabetes mellitus with other circulatory complications: Secondary | ICD-10-CM

## 2012-12-11 LAB — COMPREHENSIVE METABOLIC PANEL
ALT: 42 U/L (ref 0–53)
AST: 41 U/L — ABNORMAL HIGH (ref 0–37)
Albumin: 4.4 g/dL (ref 3.5–5.2)
Alkaline Phosphatase: 93 U/L (ref 39–117)
BUN: 22 mg/dL (ref 6–23)
CO2: 28 mEq/L (ref 19–32)
Calcium: 9.9 mg/dL (ref 8.4–10.5)
Chloride: 99 mEq/L (ref 96–112)
Creat: 0.91 mg/dL (ref 0.50–1.35)
Glucose, Bld: 99 mg/dL (ref 70–99)
Potassium: 4.3 mEq/L (ref 3.5–5.3)
Sodium: 137 mEq/L (ref 135–145)
Total Bilirubin: 0.3 mg/dL (ref 0.3–1.2)
Total Protein: 7.8 g/dL (ref 6.0–8.3)

## 2012-12-11 LAB — POCT URINALYSIS DIPSTICK
Bilirubin, UA: NEGATIVE
Glucose, UA: NEGATIVE
Ketones, UA: NEGATIVE
Leukocytes, UA: NEGATIVE
Nitrite, UA: NEGATIVE
Protein, UA: NEGATIVE
Spec Grav, UA: 1.025
Urobilinogen, UA: 0.2
pH, UA: 5

## 2012-12-11 LAB — CBC
HCT: 46.6 % (ref 39.0–52.0)
Hemoglobin: 16.3 g/dL (ref 13.0–17.0)
MCH: 30.7 pg (ref 26.0–34.0)
MCHC: 35 g/dL (ref 30.0–36.0)
MCV: 87.8 fL (ref 78.0–100.0)
Platelets: 243 K/uL (ref 150–400)
RBC: 5.31 MIL/uL (ref 4.22–5.81)
RDW: 14.2 % (ref 11.5–15.5)
WBC: 6.1 K/uL (ref 4.0–10.5)

## 2012-12-11 LAB — LIPID PANEL
Cholesterol: 196 mg/dL (ref 0–200)
HDL: 33 mg/dL — ABNORMAL LOW
LDL Cholesterol: 118 mg/dL — ABNORMAL HIGH (ref 0–99)
Total CHOL/HDL Ratio: 5.9 ratio
Triglycerides: 225 mg/dL — ABNORMAL HIGH
VLDL: 45 mg/dL — ABNORMAL HIGH (ref 0–40)

## 2012-12-11 MED ORDER — LISINOPRIL 20 MG PO TABS: 20.0000 mg | ORAL_TABLET | Freq: Every day | ORAL | Status: DC

## 2012-12-11 MED ORDER — METFORMIN HCL 500 MG PO TABS: 500.0000 mg | ORAL_TABLET | Freq: Two times a day (BID) | ORAL | Status: DC

## 2012-12-11 MED ORDER — DIAZEPAM 5 MG PO TABS: ORAL_TABLET | ORAL | Status: DC

## 2012-12-11 MED ORDER — LOVASTATIN 20 MG PO TABS: 20.0000 mg | ORAL_TABLET | Freq: Every day | ORAL | Status: DC

## 2012-12-11 MED ORDER — HYDROCHLOROTHIAZIDE 25 MG PO TABS: 25.0000 mg | ORAL_TABLET | Freq: Every day | ORAL | Status: DC

## 2012-12-11 NOTE — Progress Notes (Signed)
Subjective:    Patient ID: Todd Diaz, male    DOB: 09/11/1940, 72 y.o.   MRN: [id number]  HPI This 72 y.o. male presents for four month follow-up:  1.  HTN:  Four month follow-up; no changes to therapy made at last visit.  Patient reports good compliance with medication, good tolerance to medication, and good symptom control.  Denies CP/palp/SOB/leg swelling.  2.  Hyperlipidemia:  Four month follow-up.  Patient reports good compliance with medication, good tolerance to medication, and good symptom control.    3.  DMII:  Patient reports good compliance with medication, good tolerance to medication, and good symptom control.    4.  TIA:  No recurrent symptoms; doing well.  No concerns today.  5. Flu vaccine:  Requesting.  6. Panic attacks: chronic intermittent issue for patient; has suffered with 1-2 panic attacks since TIA; requesting rx for Valium for PRN use.    Review of Systems  Constitutional: Negative for fever, chills, diaphoresis and fatigue.  Eyes: Negative for photophobia and visual disturbance.  Respiratory: Negative for cough, shortness of breath and stridor.   Cardiovascular: Negative for chest pain, palpitations and leg swelling.  Endocrine: Negative for cold intolerance, heat intolerance, polydipsia, polyphagia and polyuria.  Skin: Negative for color change, pallor, rash and wound.  Neurological: Negative for dizziness, tremors, seizures, syncope, facial asymmetry, speech difficulty, weakness, light-headedness, numbness and headaches.  Psychiatric/Behavioral: Negative for dysphoric mood. The patient is not nervous/anxious.    Past Medical History  Diagnosis Date  . Hypertension   . Diabetes mellitus   . Avascular necrosis of medial femoral condyle   . Hyperlipidemia   . Arthritis     Knees  . Erectile dysfunction   . Hypertrophy of prostate without urinary obstruction and other lower urinary tract symptoms (LUTS)   . Chronic prostatitis   . Hearing loss     . Infection of urinary tract     ASSESSMENT:Recurrent MRSA UTI F/B Dr. Leavy Cella and Dr. Achilles Dunk  . Tobacco use disorder     No Known Allergies History   Social History  . Marital Status: Widowed    Spouse Name: N/A    Number of Children: 4  . Years of Education: 12   Occupational History  . 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 Smoking status: Former Smoker -- 0.25 packs/day for 20 years    Types: Cigarettes    Quit date: 01/09/1986  . Smokeless tobacco: Current User     Comment: Quit in the 1990's uses chewing tobacco  . Alcohol Use: Yes     Comment: occasional 12 beers per weekend night.  DUI in 2000.  . Drug Use: No  . Sexual Activity: Yes    Birth Control/ Protection: None   Other Topics Concern  . Not on file   Social History Narrative   Widowed since 02/2009 after 44 years second marriage; engaged in 01/2011.   Four children, 5 grandchildren, 1 gg.   Always uses seat belts, Smoke alarm in the home, >5 guns in the home loaded and locked up.   Caffeine JYN:WGNFAO, Tea, Carbonated beverages; consumes a moderate amount.   Exercise: Inactive.   LIVING WILL; FULL CODE, No HCPOA.       Objective:   Physical Exam  Nursing note and vitals reviewed. Constitutional: He is oriented to person, place, and time. He appears well-developed and well-nourished. No distress.  HENT:  Head: Normocephalic and atraumatic.  Right Ear: External ear normal.  Left Ear: External ear normal.  Nose: Nose normal.  Mouth/Throat: Oropharynx is clear and moist.  Eyes: Conjunctivae and EOM are normal. Pupils are equal, round, and reactive to light.  Neck: Normal range of motion. Neck supple. Carotid bruit is not present. No thyromegaly present.  Cardiovascular: Normal rate, regular rhythm, normal heart sounds and intact distal pulses.  Exam reveals no gallop and no friction rub.   No murmur heard. Pulmonary/Chest: Effort normal and breath sounds  normal. He has no wheezes. He has no rales.  Abdominal: Soft. Bowel sounds are normal. He exhibits no distension and no mass. There is no tenderness. There is no rebound and no guarding.  Lymphadenopathy:    He has no cervical adenopathy.  Neurological: He is alert and oriented to person, place, and time. No cranial nerve deficit.  Skin: Skin is warm and dry. No rash noted. He is not diaphoretic.  Psychiatric: He has a normal mood and affect. His behavior is normal.    Results for orders placed in visit on 12/11/12  POCT URINALYSIS DIPSTICK      Result Value Range   Color, UA yellow     Clarity, UA clear     Glucose, UA neg     Bilirubin, UA neg     Ketones, UA neg     Spec Grav, UA 1.025     Blood, UA trace     pH, UA 5.0     Protein, UA neg     Urobilinogen, UA 0.2     Nitrite, UA neg     Leukocytes, UA Negative     INFLUENZA VACCINE ADMINISTERED.    Assessment & Plan:  Essential hypertension, benign - Plan: CBC, Comprehensive metabolic panel, POCT urinalysis dipstick, hydrochlorothiazide (HYDRODIURIL) 25 MG tablet  Pure hypercholesterolemia - Plan: Lipid panel  Type I (juvenile type) diabetes mellitus with peripheral circulatory disorders, not stated as uncontrolled(250.71) - Plan: CBC, Microalbumin, urine, POCT urinalysis dipstick, Hemoglobin A1c  Need for prophylactic vaccination and inoculation against influenza - Plan: Flu Vaccine QUAD 36+ mos IM  Panic attack  TIA (transient ischemic attack)  Type II or unspecified type diabetes mellitus without mention of complication, not stated as uncontrolled  1. HTN: controlled; obtain labs; refills provided. 2. Hypercholesterolemia: moderately controlled; goal LDL< 70 due to DMII, h/o TIA.  Obtain labs. Rx for Mevacor provided. 3.  DMII: controlled; obtain labs; refill provided.   4.  S/p flu vaccine. 5. Panic attacks: intermittent since TIA; refill of Valium provided. 6.  TIA: stable; no recurrence.  Continue ASA  therapy.  Meds ordered this encounter  Medications  . hydrochlorothiazide (HYDRODIURIL) 25 MG tablet    Sig: Take 1 tablet (25 mg total) by mouth daily.    Dispense:  30 tablet    Refill:  5  . lisinopril (PRINIVIL,ZESTRIL) 20 MG tablet    Sig: Take 1 tablet (20 mg total) by mouth daily.    Dispense:  30 tablet    Refill:  5  . metFORMIN (GLUCOPHAGE) 500 MG tablet    Sig: Take 1 tablet (500 mg total) by mouth 2 (two) times daily with a meal.    Dispense:  60 tablet    Refill:  5  . diazepam (VALIUM) 5 MG tablet    Sig: 1/2 to 1 po with onset of panic attack    Dispense:  20 tablet    Refill:  0  .  lovastatin (MEVACOR) 20 MG tablet    Sig: Take 1 tablet (20 mg total) by mouth at bedtime.    Dispense:  30 tablet    Refill:  5   Nilda Simmer, M.D.  [facility] [address] [place], [place]  [address] [phone] phone [phone] fax

## 2012-12-12 LAB — HEMOGLOBIN A1C
Hgb A1c MFr Bld: 6.6 % — ABNORMAL HIGH (ref <5.7)
Mean Plasma Glucose: 143 mg/dL — ABNORMAL HIGH (ref <117)

## 2012-12-12 LAB — MICROALBUMIN, URINE: Microalb, Ur: 0.8 mg/dL (ref 0.00–1.89)

## 2013-02-12 ENCOUNTER — Ambulatory Visit (INDEPENDENT_AMBULATORY_CARE_PROVIDER_SITE_OTHER): Payer: Medicare PPO | Admitting: Family Medicine

## 2013-02-12 ENCOUNTER — Telehealth: Payer: Self-pay

## 2013-02-12 ENCOUNTER — Encounter: Payer: Self-pay | Admitting: Family Medicine

## 2013-02-12 VITALS — BP 147/79 | HR 57 | Temp 97.8°F | Resp 16 | Ht 69.5 in | Wt 211.0 lb

## 2013-02-12 DIAGNOSIS — I10 Essential (primary) hypertension: Secondary | ICD-10-CM

## 2013-02-12 DIAGNOSIS — E7800 Pure hypercholesterolemia, unspecified: Secondary | ICD-10-CM

## 2013-02-12 DIAGNOSIS — Z01818 Encounter for other preprocedural examination: Secondary | ICD-10-CM

## 2013-02-12 DIAGNOSIS — E119 Type 2 diabetes mellitus without complications: Secondary | ICD-10-CM

## 2013-02-12 DIAGNOSIS — K4090 Unilateral inguinal hernia, without obstruction or gangrene, not specified as recurrent: Secondary | ICD-10-CM

## 2013-02-12 DIAGNOSIS — R1031 Right lower quadrant pain: Secondary | ICD-10-CM

## 2013-02-12 LAB — COMPREHENSIVE METABOLIC PANEL
ALT: 34 U/L (ref 0–53)
AST: 28 U/L (ref 0–37)
Albumin: 4.7 g/dL (ref 3.5–5.2)
Alkaline Phosphatase: 85 U/L (ref 39–117)
BUN: 9 mg/dL (ref 6–23)
CO2: 31 mEq/L (ref 19–32)
Calcium: 10 mg/dL (ref 8.4–10.5)
Chloride: 102 mEq/L (ref 96–112)
Creat: 0.75 mg/dL (ref 0.50–1.35)
Glucose, Bld: 97 mg/dL (ref 70–99)
Potassium: 4.4 mEq/L (ref 3.5–5.3)
Sodium: 140 mEq/L (ref 135–145)
Total Bilirubin: 0.4 mg/dL (ref 0.2–1.2)
Total Protein: 7.7 g/dL (ref 6.0–8.3)

## 2013-02-12 LAB — POCT URINALYSIS DIPSTICK
Bilirubin, UA: NEGATIVE
Blood, UA: NEGATIVE
Glucose, UA: NEGATIVE
Ketones, UA: NEGATIVE
Leukocytes, UA: NEGATIVE
Nitrite, UA: NEGATIVE
Protein, UA: NEGATIVE
Spec Grav, UA: 1.015
Urobilinogen, UA: 0.2
pH, UA: 5.5

## 2013-02-12 LAB — POCT CBC
Granulocyte percent: 59.2 %G (ref 37–80)
HCT, POC: 53.5 % (ref 43.5–53.7)
Hemoglobin: 16.7 g/dL (ref 14.1–18.1)
Lymph, poc: 2 (ref 0.6–3.4)
MCH, POC: 30.5 pg (ref 27–31.2)
MCHC: 31.2 g/dL — AB (ref 31.8–35.4)
MCV: 97.8 fL — AB (ref 80–97)
MID (cbc): 0.5 (ref 0–0.9)
MPV: 9.6 fL (ref 0–99.8)
POC Granulocyte: 3.7 (ref 2–6.9)
POC LYMPH PERCENT: 32.3 %L (ref 10–50)
POC MID %: 8.5 %M (ref 0–12)
Platelet Count, POC: 219 10*3/uL (ref 142–424)
RBC: 5.47 M/uL (ref 4.69–6.13)
RDW, POC: 14.6 %
WBC: 6.2 10*3/uL (ref 4.6–10.2)

## 2013-02-12 NOTE — Progress Notes (Signed)
Subjective:   This chart was scribed for Todd Honour, MD by Todd Diaz, [facility] Scribe. This patient was seen in room 11 and the patient's care was started 4:23 PM.   Patient ID: [name], male    DOB: 03-13-1940, 73 y.o.   MRN: [id number]  HPI  HPI Comments: Todd Diaz is a 73 y.o. male with multiple medical problems including HTN, DM, and hyperlipidemia who presents to [facility] complaining of a suspected hernia described as "stinging" with associated swelling that initially developed 3 months ago. Pt denies any feeling of "fullness" at this time. Pt states he is eating as normal, and denies any other complaints associated with his suspected hernia. He denies a history of hernias, but reports a family history. Pt states he is unable to walk up a significant number of steps without experiencing extreme fatigue or SOB. At this time he denies any dysuria, nausea, vomiting, constipation, testicular pain, diarrhea, SOB at rest and with exertion, CP, leg swelling, or urinary frequency. His PSHx includes prostate surgery and bladder stone removal. Denies a PSHx of cholecystectomy or appendectomy. He currently works at [employer] and works about 3 days a week eight hour shifts; job is very physically demanding; no chest pain associated with work. He denies any structured physical activity at this time.  Review of Systems  Constitutional: Negative for fever, chills, diaphoresis, appetite change and unexpected weight change.  Respiratory: Negative for shortness of breath and wheezing.   Cardiovascular: Negative for chest pain and leg swelling.  Gastrointestinal: Negative for nausea, vomiting, diarrhea and constipation.  Genitourinary: Negative for dysuria, urgency, frequency, scrotal swelling and testicular pain.       Right inguinal hernia  Neurological: Negative for weakness.    Past Medical History  Diagnosis Date  . Hypertension   .  Diabetes mellitus   . Avascular necrosis of medial femoral condyle   . Hyperlipidemia   . Arthritis     Knees  . Erectile dysfunction   . Hypertrophy of prostate without urinary obstruction and other lower urinary tract symptoms (LUTS)   . Chronic prostatitis   . Hearing loss   . Infection of urinary tract     ASSESSMENT:Recurrent MRSA UTI F/B Todd Diaz and Todd Diaz  . Tobacco use disorder     Past Surgical History  Procedure Laterality Date  . Prostate surgery    . Bladder stone removal    . Partial knee arthroplasty  07/24/2011    Procedure: UNICOMPARTMENTAL KNEE;  Surgeon: Todd Junes, MD;  Location: [facility];  Service: Orthopedics;  Laterality: Left;  . Amputation  2009    L fifth finger surgery for near amputation. [facility].  . Admission  03/09/2012    [facility]; 24 hour admission; LE numbness/tingling.  CT head, MRI brain, carotid dopplers, 2D-echo.    History   Social History  . Marital Status: Widowed    Spouse Name: N/A    Number of Children: 4  . Years of Education: 12   Occupational History  . 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 Smoking status: Former Smoker -- 1.00 packs/day for 20 years    Types: Cigarettes    Quit date: 01/10/1976  . Smokeless tobacco: Not on file     Comment: Quit in the 1990's uses someless tobacco  . Alcohol Use: Yes  Comment: occasional 12 beers per weekend night.  DUI in 2000.  . Drug Use: No  . Sexual Activity: Yes    Birth Control/ Protection: None   Other Topics Concern  . Not on file   Social History Narrative   Widowed since 02/2009 after 39 years second marriage; engaged in 01/2011.   Four children, 5 grandchildren, 1 gg.   Always uses seat belts, Smoke alarm in the home, >5 guns in the home loaded and locked up.   Caffeine AOZ:HYQMVH, Tea, Carbonated beverages; consumes a moderate amount.   Exercise: Inactive.   LIVING WILL; FULL CODE, No HCPOA.    Triage Vitals: BP 147/79   Pulse 57  Temp(Src) 97.8 F (36.6 C)  Resp 16  Ht 5' 9.5" (1.765 m)  Wt 211 lb (95.709 kg)  BMI 30.72 kg/m2  SpO2 95%   Objective:   Physical Exam  Nursing note and vitals reviewed. Constitutional: He is oriented to person, place, and time. He appears well-developed and well-nourished. No distress.  HENT:  Head: Normocephalic and atraumatic.  Eyes: EOM are normal.  Neck: Normal range of motion.  Cardiovascular: Normal rate, regular rhythm and normal heart sounds.  Exam reveals no gallop and no friction rub.   No murmur heard. Pulmonary/Chest: Effort normal and breath sounds normal. No respiratory distress. He has no wheezes. He has no rales. He exhibits no tenderness.  Abdominal: Soft. Bowel sounds are normal. He exhibits no distension and no mass. There is no tenderness. There is no rebound and no guarding. A hernia is present. Hernia confirmed positive in the right inguinal area.  Genitourinary: Penis normal. No penile tenderness.  Right inguinal hernia non reducible, non-tender, moderate in size. Testicles normal without any masses  Musculoskeletal: Normal range of motion.  Neurological: He is alert and oriented to person, place, and time.  Skin: Skin is warm and dry. He is not diaphoretic.  Psychiatric: He has a normal mood and affect. His behavior is normal.   EKG: NSR; no ST changes.  Assessment & Plan:  Right inguinal hernia - Plan: Ambulatory referral to General Surgery  Right groin pain - Plan: POCT CBC, POCT urinalysis dipstick  Essential hypertension, benign - Plan: Comprehensive metabolic panel, EKG 84-ONGE  Type II or unspecified type diabetes mellitus without mention of complication, not stated as uncontrolled - Plan: Comprehensive metabolic panel  Pure hypercholesterolemia - Plan: Comprehensive metabolic panel  Preoperative clearance - Plan: EKG 12-Lead  1. R groin pain/R inguinal hernia non-reducible:  New.  Refer to general surgery for consultation.  To ED  for acute onset of severe pain. 2.  Pre-operative clearance:  Normal EKG; asymptomatic.  Obtain labs.  Job physically demanding. Clearance for surgery if labs normal. 3. DMII: controlled; obtain labs. 4. HTN: controlled.   5.  Hyperlipidemia: moderately controlled.  I personally performed the services described in this documentation, which was scribed in my presence.  The recorded information has been reviewed and is accurate.  Reginia Forts, M.D.  [facility] [address] [place], [place]  [address] [phone] phone [phone] fax

## 2013-02-12 NOTE — Telephone Encounter (Signed)
Pt concerned he might have a hernia. Advised to come in to be checked. Pt states he will be in today to see Dr Tamala Julian.

## 2013-02-12 NOTE — Telephone Encounter (Signed)
Patient is calling to speak to dr Tamala Julian only about some issues he is having [phone]

## 2013-02-12 NOTE — Patient Instructions (Signed)

## 2013-02-13 NOTE — Progress Notes (Deleted)
   Subjective:    Patient ID: Todd Diaz, male    DOB: October 17, 1940, 73 y.o.   MRN: [id number]  HPI    Review of Systems  Constitutional: Negative for fever, chills, diaphoresis, activity change, appetite change, fatigue and unexpected weight change.  HENT: Negative for congestion, dental problem, drooling, ear discharge, ear pain, facial swelling, hearing loss, mouth sores, nosebleeds, postnasal drip, rhinorrhea, sinus pressure, sneezing, sore throat, tinnitus, trouble swallowing and voice change.   Eyes: Negative for photophobia, pain, discharge, redness, itching and visual disturbance.  Respiratory: Negative for apnea, cough, choking, chest tightness, shortness of breath, wheezing and stridor.   Cardiovascular: Negative for chest pain, palpitations and leg swelling.  Gastrointestinal: Negative for nausea, vomiting, abdominal pain, diarrhea, constipation and blood in stool.  Endocrine: Negative for cold intolerance, heat intolerance, polydipsia, polyphagia and polyuria.  Genitourinary: Negative for dysuria, urgency, frequency, hematuria, flank pain, decreased urine volume, discharge, penile swelling, scrotal swelling, enuresis, difficulty urinating, genital sores, penile pain and testicular pain.  Musculoskeletal: Negative for arthralgias, back pain, gait problem, joint swelling, myalgias, neck pain and neck stiffness.  Skin: Negative for color change, pallor, rash and wound.  Allergic/Immunologic: Negative for environmental allergies, food allergies and immunocompromised state.  Neurological: Negative for dizziness, tremors, seizures, syncope, facial asymmetry, speech difficulty, weakness, light-headedness, numbness and headaches.  Hematological: Negative for adenopathy. Does not bruise/bleed easily.  Psychiatric/Behavioral: Negative for suicidal ideas, hallucinations, behavioral problems, confusion, sleep disturbance, self-injury, dysphoric mood, decreased concentration and agitation. The  patient is not nervous/anxious and is not hyperactive.        Objective:   Physical Exam        Assessment & Plan:

## 2013-02-25 ENCOUNTER — Ambulatory Visit (INDEPENDENT_AMBULATORY_CARE_PROVIDER_SITE_OTHER): Payer: Medicare PPO | Admitting: Surgery

## 2013-02-26 ENCOUNTER — Ambulatory Visit (INDEPENDENT_AMBULATORY_CARE_PROVIDER_SITE_OTHER): Payer: Medicare PPO | Admitting: Surgery

## 2013-02-26 ENCOUNTER — Encounter (INDEPENDENT_AMBULATORY_CARE_PROVIDER_SITE_OTHER): Payer: Self-pay | Admitting: Surgery

## 2013-02-26 VITALS — BP 158/90 | HR 72 | Temp 98.0°F | Resp 14 | Ht 72.0 in | Wt 212.8 lb

## 2013-02-26 DIAGNOSIS — K4020 Bilateral inguinal hernia, without obstruction or gangrene, not specified as recurrent: Secondary | ICD-10-CM | POA: Insufficient documentation

## 2013-02-26 DIAGNOSIS — K4090 Unilateral inguinal hernia, without obstruction or gangrene, not specified as recurrent: Secondary | ICD-10-CM

## 2013-02-26 NOTE — Progress Notes (Signed)
Subjective:     Patient ID: Todd Diaz, male   DOB: [birth year], 73 y.o.   MRN: [id number]  HPI  Note: This dictation was prepared with Dragon/digital dictation along with [facility] technology. Any transcriptional errors that result from this process are unintentional.       Todd Diaz  09/29/1940 [id number]  Patient Care Team: Wardell Honour, MD as PCP - General (Family Medicine) Murrell Redden, MD (Urology)  This patient is a 73 y.o.male who presents today for surgical evaluation at the request of Dr. Tamala Julian.   Reason for visit: Right groin swelling.  Probable hernia.  Pleasant male comes today with his wife.   He noticed some groin swelling on the right side at work.  He does occasional lifting working in a [employer].  No prior abdominal surgery.  He did have some issue with urination that required removal of bladder stones and prostate surgery at [facility].  Usually has a bowel movement every day.  He can walk 30 minutes without problems.  He has noticed that the lump has gotten larger over the past month in the groin.  Occasionally sensitive.  Inguinal hernia diagnosed.  Surgical consultation requested.   No exertional chest/neck/shoulder/arm pain.  Patient can walk 30 minutes for about 2 miles without difficulty.    Patient Active Problem List   Diagnosis Date Noted  . TIA (transient ischemic attack) 07/05/2012  . Panic attack 05/20/2012  . Bladder stone 03/25/2012  . Essential hypertension, benign 12/26/2011  . Type II or unspecified type diabetes mellitus without mention of complication, not stated as uncontrolled 12/26/2011  . Impotence 12/26/2011  . Pure hypercholesterolemia 12/26/2011  . Avascular necrosis of medial femoral condyle     Past Medical History  Diagnosis Date  . Hypertension   . Diabetes mellitus   . Avascular necrosis of medial femoral condyle   . Hyperlipidemia   . Arthritis     Knees  . Erectile dysfunction   . Hypertrophy of  prostate without urinary obstruction and other lower urinary tract symptoms (LUTS)   . Chronic prostatitis   . Hearing loss   . Infection of urinary tract     ASSESSMENT:Recurrent MRSA UTI F/B Dr. Clayborn Bigness and Dr. Jacqlyn Larsen  . Tobacco use disorder     Past Surgical History  Procedure Laterality Date  . Prostate surgery    . Bladder stone removal    . Partial knee arthroplasty  07/24/2011    Procedure: UNICOMPARTMENTAL KNEE;  Surgeon: Lorn Junes, MD;  Location: [facility];  Service: Orthopedics;  Laterality: Left;  . Amputation  2009    L fifth finger surgery for near amputation. [facility].  . Admission  03/09/2012    [facility]; 24 hour admission; LE numbness/tingling.  CT head, MRI brain, carotid dopplers, 2D-echo.    History   Social History  . Marital Status: Widowed    Spouse Name: N/A    Number of Children: 4  . Years of Education: 12   Occupational History  . 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 Smoking status: Former Smoker -- 0.25 packs/day for 20 years    Types: Cigarettes    Quit date: 01/09/1986  . Smokeless tobacco: Current User     Comment: Quit in the 1990's uses chewing tobacco  . Alcohol Use: Yes     Comment: occasional 12 beers per weekend night.  DUI in  2000.  . Drug Use: No  . Sexual Activity: Yes    Birth Control/ Protection: None   Other Topics Concern  . Not on file   Social History Narrative   Widowed since 02/2009 after 75 years second marriage; engaged in 01/2011.   Four children, 5 grandchildren, 1 gg.   Always uses seat belts, Smoke alarm in the home, >5 guns in the home loaded and locked up.   Caffeine UJW:JXBJYN, Tea, Carbonated beverages; consumes a moderate amount.   Exercise: Inactive.   LIVING WILL; FULL CODE, No HCPOA.    Family History  Problem Relation Age of Onset  . Hypertension Mother   . Cancer Mother     lung  . Heart attack Father   . Hypertension Father   . Hyperlipidemia Father   .  Aneurysm Father 72    Brain  . Diabetes Son     Current Outpatient Prescriptions  Medication Sig Dispense Refill  . aspirin EC 81 MG tablet Take 81 mg by mouth daily.      . diazepam (VALIUM) 5 MG tablet 1/2 to 1 po with onset of panic attack  20 tablet  0  . hydrochlorothiazide (HYDRODIURIL) 25 MG tablet Take 1 tablet (25 mg total) by mouth daily.  30 tablet  5  . lisinopril (PRINIVIL,ZESTRIL) 20 MG tablet Take 1 tablet (20 mg total) by mouth daily.  30 tablet  5  . lovastatin (MEVACOR) 20 MG tablet Take 1 tablet (20 mg total) by mouth at bedtime.  30 tablet  5  . metFORMIN (GLUCOPHAGE) 500 MG tablet Take 1 tablet (500 mg total) by mouth 2 (two) times daily with a meal.  60 tablet  5  . sildenafil (VIAGRA) 100 MG tablet Take 1 tablet (100 mg total) by mouth daily as needed for erectile dysfunction.  5 tablet  5   No current facility-administered medications for this visit.     No Known Allergies  BP 158/90  Pulse 72  Temp(Src) 98 F (36.7 C) (Oral)  Resp 14  Ht 6' (1.829 m)  Wt 212 lb 12.8 oz (96.525 kg)  BMI 28.85 kg/m2  No results found.   Review of Systems  Constitutional: Negative for fever, chills and diaphoresis.  HENT: Negative for ear discharge, facial swelling, mouth sores, nosebleeds, sore throat and trouble swallowing.   Eyes: Negative for photophobia, discharge and visual disturbance.  Respiratory: Negative for choking, chest tightness, shortness of breath and stridor.   Cardiovascular: Negative for chest pain and palpitations.  Gastrointestinal: Negative for nausea, vomiting, diarrhea, constipation, blood in stool, abdominal distention, anal bleeding and rectal pain.  Endocrine: Negative for cold intolerance and heat intolerance.  Genitourinary: Negative for dysuria, urgency and difficulty urinating.  Musculoskeletal: Negative for arthralgias, back pain, gait problem and myalgias.  Skin: Negative for color change, pallor, rash and wound.  Allergic/Immunologic:  Negative for environmental allergies and food allergies.  Neurological: Negative for dizziness, speech difficulty, weakness, numbness and headaches.  Hematological: Negative for adenopathy. Does not bruise/bleed easily.  Psychiatric/Behavioral: Negative for hallucinations, confusion and agitation.       Objective:   Physical Exam  Constitutional: He is oriented to person, place, and time. He appears well-developed and well-nourished. No distress.  HENT:  Head: Normocephalic.  Mouth/Throat: Oropharynx is clear and moist. No oropharyngeal exudate.  Eyes: EOM are normal. Pupils are equal, round, and reactive to light. Right conjunctiva is injected. Left conjunctiva is injected. No scleral icterus.  Lateral sclera pink and edematous.  He tells me it is a chronic condition.  No pain itching or irritation.  No bleeding.  Neck: Normal range of motion. Neck supple. No tracheal deviation present.  Cardiovascular: Normal rate, regular rhythm and intact distal pulses.   Pulmonary/Chest: Effort normal and breath sounds normal. No respiratory distress.  Abdominal: Soft. He exhibits no distension. There is no tenderness. A hernia is present. Hernia confirmed positive in the right inguinal area. Hernia confirmed negative in the left inguinal area.  Genitourinary:     Musculoskeletal: Normal range of motion. He exhibits no tenderness.  Lymphadenopathy:    He has no cervical adenopathy.       Right: No inguinal adenopathy present.       Left: No inguinal adenopathy present.  Neurological: He is alert and oriented to person, place, and time. No cranial nerve deficit. He exhibits normal muscle tone. Coordination normal.  Skin: Skin is warm and dry. No rash noted. He is not diaphoretic. No erythema. No pallor.  Psychiatric: He has a normal mood and affect. His behavior is normal. Judgment and thought content normal.       Assessment:     Moderate size right inguinal hernia.  Sensitivity in the left  groin but no definite hernia there    Plan:     I think he would benefit from repair of his hernia.  He is quite independent functional and it is a moderate size.  I would check the left side to make sure there is no hernia as well.  Discussed with the patient and his wife:  The anatomy & physiology of the abdominal wall and pelvic floor was discussed.  The pathophysiology of hernias in the inguinal and pelvic region was discussed.  Natural history risks such as progressive enlargement, pain, incarceration & strangulation was discussed.   Contributors to complications such as smoking, obesity, diabetes, prior surgery, etc were discussed.    I feel the risks of no intervention will lead to serious problems that outweigh the operative risks; therefore, I recommended surgery to reduce and repair the hernia.  I explained laparoscopic techniques with possible need for an open approach.  I noted usual use of mesh to patch and/or buttress hernia repair  Risks such as bleeding, infection, abscess, need for further treatment, heart attack, death, and other risks were discussed.  I noted a good likelihood this will help address the problem.   Goals of post-operative recovery were discussed as well.  Possibility that this will not correct all symptoms was explained.  I stressed the importance of low-impact activity, aggressive pain control, avoiding constipation, & not pushing through pain to minimize risk of post-operative chronic pain or injury. Possibility of reherniation was discussed.  We will work to minimize complications.     An educational handout further explaining the pathology & treatment options was given as well.  Questions were answered.  The patient expresses understanding & wishes to proceed with surgery.

## 2013-02-26 NOTE — Patient Instructions (Signed)
Please consider the recommendations that we have given you today:  Consider laparoscopic repair of hernia in the right groin.  Double check that there is no hernia in the left groin as well the  See the Handout(s) we have given you.  Please call our office at [phone] if you wish to schedule surgery or if you have further questions / concerns.   Hernia A hernia occurs when an internal organ pushes out through a weak spot in the abdominal wall. Hernias most commonly occur in the groin and around the navel. Hernias often can be pushed back into place (reduced). Most hernias tend to get worse over time. Some abdominal hernias can get stuck in the opening (irreducible or incarcerated hernia) and cannot be reduced. An irreducible abdominal hernia which is tightly squeezed into the opening is at risk for impaired blood supply (strangulated hernia). A strangulated hernia is a medical emergency. Because of the risk for an irreducible or strangulated hernia, surgery may be recommended to repair a hernia. CAUSES   Heavy lifting.  Prolonged coughing.  Straining to have a bowel movement.  A cut (incision) made during an abdominal surgery. HOME CARE INSTRUCTIONS   Bed rest is not required. You may continue your normal activities.  Avoid lifting more than 10 pounds (4.5 kg) or straining.  Cough gently. If you are a smoker it is best to stop. Even the best hernia repair can break down with the continual strain of coughing. Even if you do not have your hernia repaired, a cough will continue to aggravate the problem.  Do not wear anything tight over your hernia. Do not try to keep it in with an outside bandage or truss. These can damage abdominal contents if they are trapped within the hernia sac.  Eat a normal diet.  Avoid constipation. Straining over long periods of time will increase hernia size and encourage breakdown of repairs. If you cannot do this with diet alone, stool softeners may be  used. SEEK IMMEDIATE MEDICAL CARE IF:   You have a fever.  You develop increasing abdominal pain.  You feel nauseous or vomit.  Your hernia is stuck outside the abdomen, looks discolored, feels hard, or is tender.  You have any changes in your bowel habits or in the hernia that are unusual for you.  You have increased pain or swelling around the hernia.  You cannot push the hernia back in place by applying gentle pressure while lying down. MAKE SURE YOU:   Understand these instructions.  Will watch your condition.  Will get help right away if you are not doing well or get worse. Document Released: 12/26/2004 Document Revised: 03/20/2011 Document Reviewed: 08/15/2007 [facility] Patient Information 2014 [place].  HERNIA REPAIR: POST OP INSTRUCTIONS  1. DIET: Follow a light bland diet the first 24 hours after arrival home, such as soup, liquids, crackers, etc.  Be sure to include lots of fluids daily.  Avoid fast food or heavy meals as your are more likely to get nauseated.  Eat a low fat the next few days after surgery. 2. Take your usually prescribed home medications unless otherwise directed. 3. PAIN CONTROL: a. Pain is best controlled by a usual combination of three different methods TOGETHER: i. Ice/Heat ii. Over the counter pain medication iii. Prescription pain medication b. Most patients will experience some swelling and bruising around the hernia(s) such as the bellybutton, groins, or old incisions.  Ice packs or heating pads (30-60 minutes up to 6 times  a day) will help. Use ice for the first few days to help decrease swelling and bruising, then switch to heat to help relax tight/sore spots and speed recovery.  Some people prefer to use ice alone, heat alone, alternating between ice & heat.  Experiment to what works for you.  Swelling and bruising can take several weeks to resolve.   c. It is helpful to take an over-the-counter pain medication regularly for the first  few weeks.  Choose one of the following that works best for you: i. Naproxen (Aleve, etc)  Two 220mg  tabs twice a day ii. Ibuprofen (Advil, etc) Three 200mg  tabs four times a day (every meal & bedtime) iii. Acetaminophen (Tylenol, etc) 325-650mg  four times a day (every meal & bedtime) d. A  prescription for pain medication should be given to you upon discharge.  Take your pain medication as prescribed.  i. If you are having problems/concerns with the prescription medicine (does not control pain, nausea, vomiting, rash, itching, etc), please call us [phone] to see if we need to switch you to a different pain medicine that will work better for you and/or control your side effect better. ii. If you need a refill on your pain medication, please contact your pharmacy.  They will contact our office to request authorization. Prescriptions will not be filled after 5 pm or on week-ends. 4. Avoid getting constipated.  Between the surgery and the pain medications, it is common to experience some constipation.  Increasing fluid intake and taking a fiber supplement (such as Metamucil, Citrucel, FiberCon, MiraLax, etc) 1-2 times a day regularly will usually help prevent this problem from occurring.  A mild laxative (prune juice, Milk of Magnesia, MiraLax, etc) should be taken according to package directions if there are no bowel movements after 48 hours.   5. Wash / shower every day.  You may shower over the dressings as they are waterproof.   6. Remove your waterproof bandages 5 days after surgery.  You may leave the incision open to air.  You may replace a dressing/Band-Aid to cover the incision for comfort if you wish.  Continue to shower over incision(s) after the dressing is off.    7. ACTIVITIES as tolerated:   a. You may resume regular (light) daily activities beginning the next day-such as daily self-care, walking, climbing stairs-gradually increasing activities as tolerated.  If you can walk 30  minutes without difficulty, it is safe to try more intense activity such as jogging, treadmill, bicycling, low-impact aerobics, swimming, etc. b. Save the most intensive and strenuous activity for last such as sit-ups, heavy lifting, contact sports, etc  Refrain from any heavy lifting or straining until you are off narcotics for pain control.   c. DO NOT PUSH THROUGH PAIN.  Let pain be your guide: If it hurts to do something, don't do it.  Pain is your body warning you to avoid that activity for another week until the pain goes down. d. You may drive when you are no longer taking prescription pain medication, you can comfortably wear a seatbelt, and you can safely maneuver your car and apply brakes. e. Dennis Bast may have sexual intercourse when it is comfortable.  8. FOLLOW UP in our office a. Please call [facility] at [phone] to set up an appointment to see your surgeon in the office for a follow-up appointment approximately 2-3 weeks after your surgery. b. Make sure that you call for this appointment the day you arrive home to insure  a convenient appointment time. 9.  IF YOU HAVE DISABILITY OR FAMILY LEAVE FORMS, BRING THEM TO THE OFFICE FOR PROCESSING.  DO NOT GIVE THEM TO YOUR DOCTOR.  WHEN TO CALL us [phone]: 1. Poor pain control 2. Reactions / problems with new medications (rash/itching, nausea, etc)  3. Fever over 101.5 F (38.5 C) 4. Inability to urinate 5. Nausea and/or vomiting 6. Worsening swelling or bruising 7. Continued bleeding from incision. 8. Increased pain, redness, or drainage from the incision   The clinic staff is available to answer your questions during regular business hours (8:30am-5pm).  Please don't hesitate to call and ask to speak to one of our nurses for clinical concerns.   If you have a medical emergency, go to the nearest emergency room or call 911.  A surgeon from [facility] is always on call at the hospitals in [facility], [place], [place], [place], [place]  [address] ?  [address] [address], [place], [place]   [address] MAIN: [phone] ? TOLL FREE: [phone] ? FAX: [phone] [email or web address]

## 2013-03-03 ENCOUNTER — Other Ambulatory Visit (INDEPENDENT_AMBULATORY_CARE_PROVIDER_SITE_OTHER): Payer: Self-pay | Admitting: *Deleted

## 2013-03-03 DIAGNOSIS — K4020 Bilateral inguinal hernia, without obstruction or gangrene, not specified as recurrent: Secondary | ICD-10-CM

## 2013-03-03 MED ORDER — OXYCODONE HCL 5 MG PO TABS: 5.0000 mg | ORAL_TABLET | ORAL | Status: DC | PRN

## 2013-03-17 ENCOUNTER — Encounter (INDEPENDENT_AMBULATORY_CARE_PROVIDER_SITE_OTHER): Payer: Self-pay | Admitting: Surgery

## 2013-03-17 ENCOUNTER — Ambulatory Visit (INDEPENDENT_AMBULATORY_CARE_PROVIDER_SITE_OTHER): Payer: Medicare PPO | Admitting: Surgery

## 2013-03-17 VITALS — BP 138/80 | HR 80 | Temp 98.0°F | Resp 16 | Ht 72.0 in | Wt 211.4 lb

## 2013-03-17 DIAGNOSIS — K4020 Bilateral inguinal hernia, without obstruction or gangrene, not specified as recurrent: Secondary | ICD-10-CM

## 2013-03-17 NOTE — Progress Notes (Signed)
Subjective:     Patient ID: Todd Diaz, male   DOB: Aug 04, 1940, 73 y.o.   MRN: [id number]  HPI  Note: This dictation was prepared with Dragon/digital dictation along with [facility] technology. Any transcriptional errors that result from this process are unintentional.       Todd Diaz  1940-08-27 [id number]  Patient Care Team: Wardell Honour, MD as PCP - General (Family Medicine) Murrell Redden, MD (Urology)  Procedure (Date: 03/03/2013):  Lap BIH repair  This patient returns for surgical re-evaluation.  He is feeling well.  Mild soreness on the left side.  Not using pain medications since it is some mild.  Eating well.   No problems with urination or defecation.  Includes parents.  He comes today with his wife.  No fevers chills or sweats.  Gradually becoming more active.   Patient Active Problem List   Diagnosis Date Noted  . Bilateral inguinal hernia (BIH) s/p lap reapir w mesh 03/03/2013 02/26/2013  . TIA (transient ischemic attack) 07/05/2012  . Panic attack 05/20/2012  . Bladder stone 03/25/2012  . Essential hypertension, benign 12/26/2011  . Type II or unspecified type diabetes mellitus without mention of complication, not stated as uncontrolled 12/26/2011  . Impotence 12/26/2011  . Pure hypercholesterolemia 12/26/2011  . Avascular necrosis of medial femoral condyle     Past Medical History  Diagnosis Date  . Hypertension   . Diabetes mellitus   . Avascular necrosis of medial femoral condyle   . Hyperlipidemia   . Arthritis     Knees  . Erectile dysfunction   . Hypertrophy of prostate without urinary obstruction and other lower urinary tract symptoms (LUTS)   . Chronic prostatitis   . Hearing loss   . Infection of urinary tract     ASSESSMENT:Recurrent MRSA UTI F/B Dr. Clayborn Bigness and Dr. Jacqlyn Larsen  . Tobacco use disorder     Past Surgical History  Procedure Laterality Date  . Prostate surgery    . Bladder stone removal    . Partial knee arthroplasty   07/24/2011    Procedure: UNICOMPARTMENTAL KNEE;  Surgeon: Lorn Junes, MD;  Location: [place];  Service: Orthopedics;  Laterality: Left;  . Amputation  2009    L fifth finger surgery for near amputation. [place].  . Admission  03/09/2012    [facility]; 24 hour admission; LE numbness/tingling.  CT head, MRI brain, carotid dopplers, 2D-echo.  . Hernia repair      3 removed    History   Social History  . Marital Status: Widowed    Spouse Name: N/A    Number of Children: 4  . Years of Education: 12   Occupational History  . 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 Smoking status: Former Smoker -- 0.25 packs/day for 20 years    Types: Cigarettes    Quit date: 01/09/1986  . Smokeless tobacco: Current User     Comment: Quit in the 1990's uses chewing tobacco  . Alcohol Use: Yes     Comment: occasional 12 beers per weekend night.  DUI in 2000.  . Drug Use: No  . Sexual Activity: Yes    Birth Control/ Protection: None   Other Topics Concern  . Not on file   Social History Narrative   Widowed since 02/2009 after 77 years second marriage; engaged in 01/2011.   Four children, 5 grandchildren, 1 gg.  Always uses seat belts, Smoke alarm in the home, >5 guns in the home loaded and locked up.   Caffeine [id number], Tea, Carbonated beverages; consumes a moderate amount.   Exercise: Inactive.   LIVING WILL; FULL CODE, No HCPOA.    Family History  Problem Relation Age of Onset  . Hypertension Mother   . Cancer Mother     lung  . Heart attack Father   . Hypertension Father   . Hyperlipidemia Father   . Aneurysm Father 56    Brain  . Diabetes Son     Current Outpatient Prescriptions  Medication Sig Dispense Refill  . aspirin EC 81 MG tablet Take 81 mg by mouth daily.      . diazepam (VALIUM) 5 MG tablet 1/2 to 1 po with onset of panic attack  20 tablet  0  . hydrochlorothiazide (HYDRODIURIL) 25 MG tablet Take 1 tablet (25 mg total) by mouth  daily.  30 tablet  5  . lisinopril (PRINIVIL,ZESTRIL) 20 MG tablet Take 1 tablet (20 mg total) by mouth daily.  30 tablet  5  . lovastatin (MEVACOR) 20 MG tablet Take 1 tablet (20 mg total) by mouth at bedtime.  30 tablet  5  . metFORMIN (GLUCOPHAGE) 500 MG tablet Take 1 tablet (500 mg total) by mouth 2 (two) times daily with a meal.  60 tablet  5  . sildenafil (VIAGRA) 100 MG tablet Take 1 tablet (100 mg total) by mouth daily as needed for erectile dysfunction.  5 tablet  5   No current facility-administered medications for this visit.     No Known Allergies  BP 138/80  Pulse 80  Temp(Src) 98 F (36.7 C)  Resp 16  Ht 6' (1.829 m)  Wt 211 lb 6.4 oz (95.89 kg)  BMI 28.66 kg/m2  No results found.   Review of Systems  Constitutional: Negative for fever, chills and diaphoresis.  HENT: Negative for sore throat and trouble swallowing.   Eyes: Negative for photophobia and visual disturbance.  Respiratory: Negative for choking and shortness of breath.   Cardiovascular: Negative for chest pain and palpitations.  Gastrointestinal: Negative for nausea, vomiting, diarrhea, constipation, abdominal distention, anal bleeding and rectal pain.  Genitourinary: Negative for dysuria, urgency, difficulty urinating and testicular pain.  Musculoskeletal: Negative for arthralgias, gait problem, myalgias and neck pain.  Skin: Negative for color change and rash.  Neurological: Negative for dizziness, speech difficulty, weakness and numbness.  Hematological: Negative for adenopathy.  Psychiatric/Behavioral: Negative for hallucinations, confusion and agitation.       Objective:   Physical Exam  Constitutional: He is oriented to person, place, and time. He appears well-developed and well-nourished. No distress.  HENT:  Head: Normocephalic.  Mouth/Throat: Oropharynx is clear and moist. No oropharyngeal exudate.  Eyes: Conjunctivae and EOM are normal. Pupils are equal, round, and reactive to light. No  scleral icterus.  Neck: Normal range of motion. No tracheal deviation present.  Cardiovascular: Normal rate, normal heart sounds and intact distal pulses.   Pulmonary/Chest: Effort normal. No respiratory distress.  Abdominal: Soft. He exhibits no distension. There is no tenderness. Hernia confirmed negative in the right inguinal area and confirmed negative in the left inguinal area.  Incisions clean with normal healing ridges.  No hernias  Musculoskeletal: Normal range of motion. He exhibits no tenderness.  Neurological: He is alert and oriented to person, place, and time. No cranial nerve deficit. He exhibits normal muscle tone. Coordination normal.  Skin: Skin is warm and dry.  No rash noted. He is not diaphoretic.  Psychiatric: He has a normal mood and affect. His behavior is normal.       Assessment:     Recovering well status post laparoscopic bilateral inguinal hernia repair with mesh     Plan:     I am glad he is recovering well so far.  Increase activity as tolerated to regular activity.  Low impact exercise such as walking an hour a day at least ideal.  Do not push through pain.  Diet as tolerated.  Low fat high fiber diet ideal.  Bowel regimen with 30 g fiber a day and fiber supplement as needed to avoid problems.  Return to clinic as needed.   Instructions discussed.  Followup with primary care physician for other health issues as would normally be done.  Consider screening for malignancies (breast, prostate, colon, melanoma, etc) as appropriate.  Questions answered.  The patient & his wife expressed understanding and appreciation

## 2013-03-17 NOTE — Patient Instructions (Signed)
HERNIA REPAIR: POST OP INSTRUCTIONS  1. DIET: Follow a light bland diet the first 24 hours after arrival home, such as soup, liquids, crackers, etc.  Be sure to include lots of fluids daily.  Avoid fast food or heavy meals as your are more likely to get nauseated.  Eat a low fat the next few days after surgery. 2. Take your usually prescribed home medications unless otherwise directed. 3. PAIN CONTROL: a. Pain is best controlled by a usual combination of three different methods TOGETHER: i. Ice/Heat ii. Over the counter pain medication iii. Prescription pain medication b. Most patients will experience some swelling and bruising around the hernia(s) such as the bellybutton, groins, or old incisions.  Ice packs or heating pads (30-60 minutes up to 6 times a day) will help. Use ice for the first few days to help decrease swelling and bruising, then switch to heat to help relax tight/sore spots and speed recovery.  Some people prefer to use ice alone, heat alone, alternating between ice & heat.  Experiment to what works for you.  Swelling and bruising can take several weeks to resolve.   c. It is helpful to take an over-the-counter pain medication regularly for the first few weeks.  Choose one of the following that works best for you: i. Naproxen (Aleve, etc)  Two 220mg tabs twice a day ii. Ibuprofen (Advil, etc) Three 200mg tabs four times a day (every meal & bedtime) iii. Acetaminophen (Tylenol, etc) 325-650mg four times a day (every meal & bedtime) d. A  prescription for pain medication should be given to you upon discharge.  Take your pain medication as prescribed.  i. If you are having problems/concerns with the prescription medicine (does not control pain, nausea, vomiting, rash, itching, etc), please call us [phone] to see if we need to switch you to a different pain medicine that will work better for you and/or control your side effect better. ii. If you need a refill on your pain  medication, please contact your pharmacy.  They will contact our office to request authorization. Prescriptions will not be filled after 5 pm or on week-ends. 4. Avoid getting constipated.  Between the surgery and the pain medications, it is common to experience some constipation.  Increasing fluid intake and taking a fiber supplement (such as Metamucil, Citrucel, FiberCon, MiraLax, etc) 1-2 times a day regularly will usually help prevent this problem from occurring.  A mild laxative (prune juice, Milk of Magnesia, MiraLax, etc) should be taken according to package directions if there are no bowel movements after 48 hours.   5. Wash / shower every day.  You may shower over the dressings as they are waterproof.   6. Remove your waterproof bandages 5 days after surgery.  You may leave the incision open to air.  You may replace a dressing/Band-Aid to cover the incision for comfort if you wish.  Continue to shower over incision(s) after the dressing is off.    7. ACTIVITIES as tolerated:   a. You may resume regular (light) daily activities beginning the next day-such as daily self-care, walking, climbing stairs-gradually increasing activities as tolerated.  If you can walk 30 minutes without difficulty, it is safe to try more intense activity such as jogging, treadmill, bicycling, low-impact aerobics, swimming, etc. b. Save the most intensive and strenuous activity for last such as sit-ups, heavy lifting, contact sports, etc  Refrain from any heavy lifting or straining until you are off narcotics for pain control.     c. DO NOT PUSH THROUGH PAIN.  Let pain be your guide: If it hurts to do something, don't do it.  Pain is your body warning you to avoid that activity for another week until the pain goes down. d. You may drive when you are no longer taking prescription pain medication, you can comfortably wear a seatbelt, and you can safely maneuver your car and apply brakes. e. You may have sexual intercourse  when it is comfortable.  8. FOLLOW UP in our office a. Please call [facility] at [phone] to set up an appointment to see your surgeon in the office for a follow-up appointment approximately 2-3 weeks after your surgery. b. Make sure that you call for this appointment the day you arrive home to insure a convenient appointment time. 9.  IF YOU HAVE DISABILITY OR FAMILY LEAVE FORMS, BRING THEM TO THE OFFICE FOR PROCESSING.  DO NOT GIVE THEM TO YOUR DOCTOR.  WHEN TO CALL US [phone]: 1. Poor pain control 2. Reactions / problems with new medications (rash/itching, nausea, etc)  3. Fever over 101.5 F (38.5 C) 4. Inability to urinate 5. Nausea and/or vomiting 6. Worsening swelling or bruising 7. Continued bleeding from incision. 8. Increased pain, redness, or drainage from the incision   The clinic staff is available to answer your questions during regular business hours (8:30am-5pm).  Please don't hesitate to call and ask to speak to one of our nurses for clinical concerns.   If you have a medical emergency, go to the nearest emergency room or call 911.  A surgeon from [facility] is always on call at the hospitals in [place]  [facility] [address], [address], [place], [place]  [address] ?  [address] [address], [place], [place]   [address] MAIN: [phone] ? TOLL FREE: [phone] ? FAX: [phone] [email or web address]  Managing Pain  Pain after surgery or related to activity is often due to strain/injury to muscle, tendon, nerves and/or incisions.  This pain is usually short-term and will improve in a few months.   Many people find it helpful to do the following things TOGETHER to help speed the process of healing and to get back to regular activity more quickly:  1. Avoid heavy physical activity a.  no lifting greater than 20 pounds b. Do not "push through" the pain.  Listen to your body and avoid positions and maneuvers than  reproduce the pain c. Walking is okay as tolerated, but go slowly and stop when getting sore.  d. Remember: If it hurts to do it, then don't do it! 2. Take Anti-inflammatory medication  a. Take with food/snack around the clock for 1-2 weeks i. This helps the muscle and nerve tissues become less irritable and calm down faster b. Choose ONE of the following over-the-counter medications: i. Naproxen 220mg tabs (ex. Aleve) 1-2 pills twice a day  ii. Ibuprofen 200mg tabs (ex. Advil, Motrin) 3-4 pills with every meal and just before bedtime iii. Acetaminophen 500mg tabs (Tylenol) 1-2 pills with every meal and just before bedtime 3. Use a Heating pad or Ice/Cold Pack a. 4-6 times a day b. May use warm bath/hottub  or showers 4. Try Gentle Massage and/or Stretching  a. at the area of pain many times a day b. stop if you feel pain - do not overdo it  Try these steps together to help you body heal faster and avoid making things get worse.  Doing just one of these   things may not be enough.    If you are not getting better after two weeks or are noticing you are getting worse, contact our office for further advice; we may need to re-evaluate you & see what other things we can do to help.   

## 2013-04-09 HISTORY — PX: HERNIA REPAIR: SHX51

## 2013-04-11 ENCOUNTER — Other Ambulatory Visit: Payer: Self-pay | Admitting: Family Medicine

## 2013-05-06 LAB — HM DIABETES EYE EXAM

## 2013-05-15 ENCOUNTER — Encounter: Payer: Self-pay | Admitting: *Deleted

## 2013-07-11 ENCOUNTER — Other Ambulatory Visit: Payer: Self-pay | Admitting: Family Medicine

## 2013-07-28 ENCOUNTER — Encounter (INDEPENDENT_AMBULATORY_CARE_PROVIDER_SITE_OTHER): Payer: Medicare PPO | Admitting: Ophthalmology

## 2013-07-28 ENCOUNTER — Other Ambulatory Visit: Payer: Self-pay | Admitting: Family Medicine

## 2013-07-28 DIAGNOSIS — I10 Essential (primary) hypertension: Secondary | ICD-10-CM

## 2013-07-28 DIAGNOSIS — H33309 Unspecified retinal break, unspecified eye: Secondary | ICD-10-CM

## 2013-07-28 DIAGNOSIS — H2510 Age-related nuclear cataract, unspecified eye: Secondary | ICD-10-CM

## 2013-07-28 DIAGNOSIS — H43819 Vitreous degeneration, unspecified eye: Secondary | ICD-10-CM

## 2013-07-28 DIAGNOSIS — H35039 Hypertensive retinopathy, unspecified eye: Secondary | ICD-10-CM

## 2013-07-28 DIAGNOSIS — H33009 Unspecified retinal detachment with retinal break, unspecified eye: Secondary | ICD-10-CM

## 2013-08-09 HISTORY — PX: EYE SURGERY: SHX253

## 2013-08-09 HISTORY — PX: RETINAL TEAR REPAIR CRYOTHERAPY: SHX5304

## 2013-08-14 ENCOUNTER — Ambulatory Visit (INDEPENDENT_AMBULATORY_CARE_PROVIDER_SITE_OTHER): Payer: Medicare PPO | Admitting: Ophthalmology

## 2013-08-18 ENCOUNTER — Ambulatory Visit (INDEPENDENT_AMBULATORY_CARE_PROVIDER_SITE_OTHER): Payer: Medicare PPO | Admitting: Ophthalmology

## 2013-08-18 DIAGNOSIS — H33009 Unspecified retinal detachment with retinal break, unspecified eye: Secondary | ICD-10-CM

## 2013-09-16 ENCOUNTER — Other Ambulatory Visit: Payer: Self-pay | Admitting: Family Medicine

## 2013-09-18 ENCOUNTER — Encounter (INDEPENDENT_AMBULATORY_CARE_PROVIDER_SITE_OTHER): Payer: Medicare PPO | Admitting: Ophthalmology

## 2013-09-18 DIAGNOSIS — H33009 Unspecified retinal detachment with retinal break, unspecified eye: Secondary | ICD-10-CM

## 2013-09-24 ENCOUNTER — Other Ambulatory Visit: Payer: Self-pay | Admitting: Family Medicine

## 2013-10-01 ENCOUNTER — Other Ambulatory Visit: Payer: Self-pay | Admitting: Family Medicine

## 2013-10-02 NOTE — Telephone Encounter (Signed)
Call --- I approved refill on HCTZ but pt overdue for follow-up. He will need appointment to receive further refills.  Last visit 02/2013.

## 2013-10-04 ENCOUNTER — Other Ambulatory Visit: Payer: Self-pay | Admitting: Radiology

## 2013-10-04 NOTE — Telephone Encounter (Signed)
Called in HCTZ rx not received by pharmacy.

## 2013-10-06 NOTE — Telephone Encounter (Signed)
Notified pt of need for f/up and he stated he thought he had an appt sch for Nov. I didn't see anything scheduled so transferred pt to Scheduling.

## 2013-10-13 ENCOUNTER — Ambulatory Visit (INDEPENDENT_AMBULATORY_CARE_PROVIDER_SITE_OTHER): Payer: Medicare PPO | Admitting: Family Medicine

## 2013-10-13 ENCOUNTER — Encounter: Payer: Self-pay | Admitting: Family Medicine

## 2013-10-13 VITALS — BP 138/82 | HR 80 | Temp 97.5°F | Resp 16 | Ht 70.0 in | Wt 206.0 lb

## 2013-10-13 DIAGNOSIS — I10 Essential (primary) hypertension: Secondary | ICD-10-CM

## 2013-10-13 DIAGNOSIS — Z125 Encounter for screening for malignant neoplasm of prostate: Secondary | ICD-10-CM

## 2013-10-13 DIAGNOSIS — K4020 Bilateral inguinal hernia, without obstruction or gangrene, not specified as recurrent: Secondary | ICD-10-CM

## 2013-10-13 DIAGNOSIS — Z23 Encounter for immunization: Secondary | ICD-10-CM

## 2013-10-13 DIAGNOSIS — G459 Transient cerebral ischemic attack, unspecified: Secondary | ICD-10-CM

## 2013-10-13 DIAGNOSIS — E7800 Pure hypercholesterolemia, unspecified: Secondary | ICD-10-CM

## 2013-10-13 DIAGNOSIS — Z0000 Encounter for general adult medical examination without abnormal findings: Secondary | ICD-10-CM

## 2013-10-13 DIAGNOSIS — E119 Type 2 diabetes mellitus without complications: Secondary | ICD-10-CM

## 2013-10-13 LAB — POCT URINALYSIS DIPSTICK
Bilirubin, UA: NEGATIVE
Glucose, UA: NEGATIVE
Ketones, UA: NEGATIVE
Nitrite, UA: NEGATIVE
Protein, UA: NEGATIVE
Spec Grav, UA: 1.01
Urobilinogen, UA: 0.2
pH, UA: 7

## 2013-10-13 MED ORDER — HYDROCHLOROTHIAZIDE 25 MG PO TABS: 25.0000 mg | ORAL_TABLET | Freq: Every day | ORAL | Status: DC

## 2013-10-13 MED ORDER — LISINOPRIL 20 MG PO TABS: 20.0000 mg | ORAL_TABLET | Freq: Every day | ORAL | Status: DC

## 2013-10-13 MED ORDER — METFORMIN HCL 500 MG PO TABS: 500.0000 mg | ORAL_TABLET | Freq: Two times a day (BID) | ORAL | Status: DC

## 2013-10-13 NOTE — Patient Instructions (Signed)

## 2013-10-13 NOTE — Progress Notes (Signed)
Subjective:    Patient ID: Todd Diaz, male    DOB: [birth year], 73 y.o.   MRN: [id number] This chart was scribed for Reginia Forts, MD by Zola Button, Medical Scribe. This patient was seen in Room 22 and the patient's care was started at 2:35 PM.   10/13/2013  Annual Exam, Hyperlipidemia, Hypertension and Diabetes   HPI HPI Comments: Todd Diaz is a 73 y.o. male with a hx of HLD, HTN and DM who presents to [facility] for an annual wellness exam. Patient had cataracts surgery on his left eye done by Dr. Pandora Leiter 2 months ago and laser surgery on his right eye due to a retina tear, done by Dr. [name]. Patient is due to have cataracts surgery on his right eye in 3 months. He has hx of 3 hernias. He has not had a physical exam done in at least 2 years and also has never had a colonoscopy done. Patient last had his tetanus shot and his first PNA shot in 2013. He denies having the shingles vaccine. Patient currently has dentures.   HTN and DMII:  His last blood sugar measurement was 120 a week ago, and his blood pressure has been around 130/80. Patient reports good compliance with medication, good tolerance to medication, and good symptom control.    He has hx of stones in his prostate and bladder. Patient has not seen his urologist since his prostate stone removal and bladder stone removal surgery, which was over 4 years ago.   Pt has no brothers and a sister still living with no current health problems.   Patient has 6 children and has been married twice in the past, and he is currently engaged. He has 8 grandchildren and 2 great-grandchildren. 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 Patient has quit smoking over 30 years ago and he reports drinking several beers on weekends. Patient always wears his seatbelt when in the car.   He has no allergies. Patient reports takes 2 ASA per day. He has been taking his blood pressure medications, but he has not  been taking his cholesterol pill. He has been taking his Metformin BID and Viagra.   He notes having some chronic palpitations and has bowel movement every day. His bladder has been doing well and sometimes does not need to urinate at night. Patient denies HA, mouth sores, CP, SOB, cough, neck pain, shoulder pain, numbness and tingling, leg swelling, back pain, constipation, diarrhea, bloody stools, melena, heartburn, abdominal pain, trouble sleeping, anxiety, and depression.   Review of Systems  Constitutional: Negative for fever, chills, diaphoresis, activity change, appetite change, fatigue and unexpected weight change.  HENT: Positive for hearing loss and tinnitus. Negative for congestion, dental problem, drooling, ear discharge, ear pain, facial swelling, mouth sores, nosebleeds, postnasal drip, rhinorrhea, sinus pressure, sneezing, sore throat, trouble swallowing and voice change.   Eyes: Negative for photophobia, pain, discharge, redness, itching and visual disturbance.  Respiratory: Negative for apnea, cough, choking, chest tightness, shortness of breath, wheezing and stridor.   Cardiovascular: Positive for palpitations. Negative for chest pain and leg swelling.  Gastrointestinal: Negative for nausea, vomiting, abdominal pain, diarrhea, constipation and blood in stool.  Endocrine: Negative for cold intolerance, heat intolerance, polydipsia, polyphagia and polyuria.  Genitourinary: Negative for dysuria, urgency, frequency, hematuria, flank pain, decreased urine volume, discharge, penile swelling, scrotal swelling, enuresis, difficulty urinating, genital sores, penile pain and testicular pain.  Musculoskeletal: Negative for arthralgias, back  pain, gait problem, joint swelling, myalgias, neck pain and neck stiffness.  Skin: Negative for color change, pallor, rash and wound.  Allergic/Immunologic: Negative for environmental allergies, food allergies and immunocompromised state.  Neurological:  Negative for dizziness, tremors, seizures, syncope, facial asymmetry, speech difficulty, weakness, light-headedness, numbness and headaches.  Hematological: Negative for adenopathy. Does not bruise/bleed easily.  Psychiatric/Behavioral: Negative for suicidal ideas, hallucinations, behavioral problems, confusion, sleep disturbance, self-injury, dysphoric mood, decreased concentration and agitation. The patient is not nervous/anxious and is not hyperactive.     Past Medical History  Diagnosis Date  . Hypertension   . Diabetes mellitus   . Avascular necrosis of medial femoral condyle   . Hyperlipidemia   . Arthritis     Knees  . Erectile dysfunction   . Hypertrophy of prostate without urinary obstruction and other lower urinary tract symptoms (LUTS)   . Chronic prostatitis   . Hearing loss   . Infection of urinary tract     ASSESSMENT:Recurrent MRSA UTI F/B Dr. Clayborn Bigness and Dr. Jacqlyn Larsen  . Tobacco use disorder    Past Surgical History  Procedure Laterality Date  . Prostate surgery    . Bladder stone removal    . Partial knee arthroplasty  07/24/2011    Procedure: UNICOMPARTMENTAL KNEE;  Surgeon: Lorn Junes, MD;  Location: [facility];  Service: Orthopedics;  Laterality: Left;  . Amputation  2009    L fifth finger surgery for near amputation. [place].  . Admission  03/09/2012    [facility]; 24 hour admission; LE numbness/tingling.  CT head, MRI brain, carotid dopplers, 2D-echo.  . Hernia repair      3 removed   No Known Allergies Current Outpatient Prescriptions  Medication Sig Dispense Refill  . aspirin EC 81 MG tablet Take 81 mg by mouth daily.      . hydrochlorothiazide (HYDRODIURIL) 25 MG tablet Take 1 tablet (25 mg total) by mouth daily.  30 tablet  5  . lisinopril (PRINIVIL,ZESTRIL) 20 MG tablet Take 1 tablet (20 mg total) by mouth daily.  30 tablet  5  . metFORMIN (GLUCOPHAGE) 500 MG tablet Take 1 tablet (500 mg total) by mouth 2 (two) times daily with a meal.  60 tablet  5  . VIAGRA 100  MG tablet take 1 tablet by mouth once daily if needed  8 tablet  0  . diazepam (VALIUM) 5 MG tablet 1/2 to 1 po with onset of panic attack  20 tablet  0  . lovastatin (MEVACOR) 20 MG tablet Take 1 tablet (20 mg total) by mouth at bedtime.  30 tablet  5   No current facility-administered medications for this visit.       Objective:    BP 138/82  Pulse 80  Temp(Src) 97.5 F (36.4 C) (Oral)  Resp 16  Ht 5\' 10"  (1.778 m)  Wt 206 lb (93.441 kg)  BMI 29.56 kg/m2  SpO2 94% Physical Exam  Nursing note and vitals reviewed. Constitutional: He is oriented to person, place, and time. He appears well-developed and well-nourished. No distress.  HENT:  Head: Normocephalic and atraumatic.  Right Ear: External ear normal.  Left Ear: External ear normal.  Nose: Nose normal.  Mouth/Throat: Oropharynx is clear and moist. No oropharyngeal exudate.  Eyes: Conjunctivae and EOM are normal. Pupils are equal, round, and reactive to light.  Neck: Normal range of motion. Neck supple. Carotid bruit is not present. No thyromegaly present.  Cardiovascular: Normal rate, regular rhythm, normal heart sounds and intact distal pulses.  Exam reveals no gallop and no friction rub.   No murmur heard. Frequent PVCs  Pulmonary/Chest: Effort normal and breath sounds normal. No respiratory distress. He has no wheezes. He has no rales.  Abdominal: Soft. Bowel sounds are normal. He exhibits no distension and no mass. There is no tenderness. There is no rebound and no guarding.  Musculoskeletal: Normal range of motion. He exhibits no edema.       Right shoulder: Normal.       Left shoulder: Normal.       Cervical back: Normal.  Lymphadenopathy:    He has no cervical adenopathy.  Neurological: He is alert and oriented to person, place, and time. He has normal reflexes. No cranial nerve deficit. He exhibits normal muscle tone. Coordination normal.  Skin: Skin is warm and dry. No rash noted. He is not diaphoretic.    Psychiatric: He has a normal mood and affect. His behavior is normal. Judgment and thought content normal.   PREVNAR-13 AND INFLUENZA VACCINES ADMINISTERED IN OFFICE.     Assessment & Plan:   1. Encounter for Medicare annual wellness exam   2. Type 2 diabetes mellitus without complication   3. Transient cerebral ischemia, unspecified transient cerebral ischemia type   4. Pure hypercholesterolemia   5. Essential hypertension, benign   6. Bilateral inguinal hernia without obstruction or gangrene, recurrence not specified   7. Screening for prostate cancer   8. Need for prophylactic vaccination against Streptococcus pneumoniae (pneumococcus) and influenza   9. Need for prophylactic vaccination against Streptococcus pneumoniae (pneumococcus)   10. Need for prophylactic vaccination and inoculation against influenza     1. Annual Wellness Exam: anticipatory guidance provided.  Obtain PSA.  Immunizations reviewed; s/p flu and Prevnar-13 in office. Pt declined colon cancer screening; declined DRE today as well.  Independent with ADLs. No evidence of depression. Known hearing loss.  Low fall risk.  Recommend formalizing advanced directives. 2.  TIA history: stable; continue ASA bid. 3.  HTN: controlled; obtain labs; refills provided. 4.  Hyperlipidemia: uncontrolled due to non-compliance with statin. Obtain labs.  Goal LDL< 70 due to TIA history and DMII. 5.  DMII: controlled; obtain labs; eye exam UTD; monofilament exam UTD.  Refills provided. 6.  Screening prostate cancer: obtain PSA; pt declined DRE. 7.  S/p B inguinal hernia repair; doing well. 8. S/p Prevnar-13 and flu vaccines.   Meds ordered this encounter  Medications  . hydrochlorothiazide (HYDRODIURIL) 25 MG tablet    Sig: Take 1 tablet (25 mg total) by mouth daily.    Dispense:  30 tablet    Refill:  5  . lisinopril (PRINIVIL,ZESTRIL) 20 MG tablet    Sig: Take 1 tablet (20 mg total) by mouth daily.    Dispense:  30 tablet     Refill:  5  . metFORMIN (GLUCOPHAGE) 500 MG tablet    Sig: Take 1 tablet (500 mg total) by mouth 2 (two) times daily with a meal.    Dispense:  60 tablet    Refill:  5    Return in about 4 months (around 02/13/2014) for recheck diabetes, high blood pressure, high cholesterol.  I personally performed the services described in this documentation, which was scribed in my presence.  The recorded information has been reviewed and is accurate.  Reginia Forts, M.D.  [facility] [address] [place], [place]  [address] [phone] phone [phone] fax

## 2013-10-14 LAB — COMPREHENSIVE METABOLIC PANEL
ALT: 34 U/L (ref 0–53)
AST: 32 U/L (ref 0–37)
Albumin: 4.5 g/dL (ref 3.5–5.2)
Alkaline Phosphatase: 80 U/L (ref 39–117)
BUN: 14 mg/dL (ref 6–23)
CO2: 31 mEq/L (ref 19–32)
Calcium: 9.9 mg/dL (ref 8.4–10.5)
Chloride: 99 mEq/L (ref 96–112)
Creat: 0.78 mg/dL (ref 0.50–1.35)
Glucose, Bld: 88 mg/dL (ref 70–99)
Potassium: 4.2 mEq/L (ref 3.5–5.3)
Sodium: 139 mEq/L (ref 135–145)
Total Bilirubin: 0.4 mg/dL (ref 0.2–1.2)
Total Protein: 7.5 g/dL (ref 6.0–8.3)

## 2013-10-14 LAB — CBC WITH DIFFERENTIAL/PLATELET
Basophils Absolute: 0 10*3/uL (ref 0.0–0.1)
Basophils Relative: 0 % (ref 0–1)
Eosinophils Absolute: 0.1 10*3/uL (ref 0.0–0.7)
Eosinophils Relative: 1 % (ref 0–5)
HCT: 48.4 % (ref 39.0–52.0)
Hemoglobin: 17 g/dL (ref 13.0–17.0)
Lymphocytes Relative: 25 % (ref 12–46)
Lymphs Abs: 1.7 10*3/uL (ref 0.7–4.0)
MCH: 31.5 pg (ref 26.0–34.0)
MCHC: 35.1 g/dL (ref 30.0–36.0)
MCV: 89.6 fL (ref 78.0–100.0)
Monocytes Absolute: 0.5 10*3/uL (ref 0.1–1.0)
Monocytes Relative: 8 % (ref 3–12)
Neutro Abs: 4.4 10*3/uL (ref 1.7–7.7)
Neutrophils Relative %: 66 % (ref 43–77)
Platelets: 229 10*3/uL (ref 150–400)
RBC: 5.4 MIL/uL (ref 4.22–5.81)
RDW: 13.9 % (ref 11.5–15.5)
WBC: 6.7 10*3/uL (ref 4.0–10.5)

## 2013-10-14 LAB — LIPID PANEL
Cholesterol: 185 mg/dL (ref 0–200)
HDL: 31 mg/dL — ABNORMAL LOW (ref >39)
LDL Cholesterol: 96 mg/dL (ref 0–99)
Total CHOL/HDL Ratio: 6 Ratio
Triglycerides: 291 mg/dL — ABNORMAL HIGH (ref <150)
VLDL: 58 mg/dL — ABNORMAL HIGH (ref 0–40)

## 2013-10-14 LAB — TSH: TSH: 1.482 u[IU]/mL (ref 0.350–4.500)

## 2013-10-14 LAB — HEMOGLOBIN A1C
Hgb A1c MFr Bld: 6.2 % — ABNORMAL HIGH (ref <5.7)
Mean Plasma Glucose: 131 mg/dL — ABNORMAL HIGH (ref <117)

## 2013-10-14 LAB — MICROALBUMIN, URINE: Microalb, Ur: 1.2 mg/dL (ref <2.0)

## 2013-10-14 LAB — PSA: PSA: 0.25 ng/mL (ref <=4.00)

## 2013-10-16 ENCOUNTER — Encounter: Payer: Self-pay | Admitting: Family Medicine

## 2013-10-17 ENCOUNTER — Other Ambulatory Visit: Payer: Self-pay

## 2013-10-17 NOTE — Telephone Encounter (Signed)
Patient says Dr Tamala Julian called in Diazepam on Monday and the pharmacy did not have it, so patient requested a refill based on the bottle he already had and it was declined. Patient wants to know if this medication can be resent to the pharmacy

## 2013-10-17 NOTE — Telephone Encounter (Signed)
I see no record it was sent on Monday, please advise , pended

## 2013-10-20 MED ORDER — DIAZEPAM 5 MG PO TABS: ORAL_TABLET | ORAL | Status: DC

## 2013-10-20 NOTE — Telephone Encounter (Signed)
I did print on Monday; please call in refill of Valium to pharmacy. Thanks!

## 2013-10-21 LAB — COMPREHENSIVE METABOLIC PANEL
Albumin: 3.6 g/dL (ref 3.4–5.0)
Alkaline Phosphatase: 93 U/L
Anion Gap: 6 — ABNORMAL LOW (ref 7–16)
BUN: 16 mg/dL (ref 7–18)
Bilirubin,Total: 0.2 mg/dL (ref 0.2–1.0)
Calcium, Total: 9 mg/dL (ref 8.5–10.1)
Chloride: 104 mmol/L (ref 98–107)
Co2: 30 mmol/L (ref 21–32)
Creatinine: 0.95 mg/dL (ref 0.60–1.30)
EGFR (African American): >60
EGFR (Non-African Amer.): >60
Glucose: 128 mg/dL — ABNORMAL HIGH (ref 65–99)
Osmolality: 282 (ref 275–301)
Potassium: 3.6 mmol/L (ref 3.5–5.1)
SGOT(AST): 44 U/L — ABNORMAL HIGH (ref 15–37)
SGPT (ALT): 48 U/L
Sodium: 140 mmol/L (ref 136–145)
Total Protein: 7.2 g/dL (ref 6.4–8.2)

## 2013-10-21 LAB — CBC WITH DIFFERENTIAL/PLATELET
Basophil #: 0 10*3/uL (ref 0.0–0.1)
Basophil %: 0.8 %
Eosinophil #: 0.1 10*3/uL (ref 0.0–0.7)
Eosinophil %: 2.7 %
HCT: 46.9 % (ref 40.0–52.0)
HGB: 15.4 g/dL (ref 13.0–18.0)
Lymphocyte #: 1.9 10*3/uL (ref 1.0–3.6)
Lymphocyte %: 35 %
MCH: 30.5 pg (ref 26.0–34.0)
MCHC: 32.8 g/dL (ref 32.0–36.0)
MCV: 93 fL (ref 80–100)
Monocyte #: 0.6 x10 3/mm (ref 0.2–1.0)
Monocyte %: 10.7 %
Neutrophil #: 2.8 10*3/uL (ref 1.4–6.5)
Neutrophil %: 50.8 %
Platelet: 200 10*3/uL (ref 150–440)
RBC: 5.04 10*6/uL (ref 4.40–5.90)
RDW: 13.9 % (ref 11.5–14.5)
WBC: 5.5 10*3/uL (ref 3.8–10.6)

## 2013-10-21 LAB — PROTIME-INR
INR: 0.9
Prothrombin Time: 12.5 secs (ref 11.5–14.7)

## 2013-10-21 LAB — APTT: Activated PTT: 29.2 secs (ref 23.6–35.9)

## 2013-10-21 LAB — TROPONIN I: Troponin-I: <0.02 ng/mL

## 2013-10-21 NOTE — Telephone Encounter (Signed)
Script called in today.

## 2013-10-22 ENCOUNTER — Observation Stay: Payer: Self-pay | Admitting: Internal Medicine

## 2013-11-05 ENCOUNTER — Ambulatory Visit: Payer: Medicare PPO | Admitting: Family Medicine

## 2013-11-19 ENCOUNTER — Ambulatory Visit: Payer: Medicare PPO | Admitting: Family Medicine

## 2013-11-21 ENCOUNTER — Telehealth: Payer: Self-pay

## 2013-11-21 ENCOUNTER — Other Ambulatory Visit: Payer: Self-pay

## 2013-11-21 DIAGNOSIS — I10 Essential (primary) hypertension: Secondary | ICD-10-CM

## 2013-11-21 DIAGNOSIS — E119 Type 2 diabetes mellitus without complications: Secondary | ICD-10-CM

## 2013-11-21 MED ORDER — LISINOPRIL 20 MG PO TABS: 20.0000 mg | ORAL_TABLET | Freq: Every day | ORAL | Status: DC

## 2013-11-21 MED ORDER — GLUCOSE BLOOD VI STRP: ORAL_STRIP | Status: DC

## 2013-11-21 MED ORDER — LANCETS MISC: Status: DC

## 2013-11-21 MED ORDER — METFORMIN HCL 500 MG PO TABS: 500.0000 mg | ORAL_TABLET | Freq: Two times a day (BID) | ORAL | Status: DC

## 2013-11-21 MED ORDER — ACCU-CHEK COMPACT PLUS CONTROL VI SOLN: Status: AC

## 2013-11-21 MED ORDER — BD SWAB SINGLE USE REGULAR PADS: MEDICATED_PAD | Status: DC

## 2013-11-21 MED ORDER — HYDROCHLOROTHIAZIDE 25 MG PO TABS: 25.0000 mg | ORAL_TABLET | Freq: Every day | ORAL | Status: DC

## 2013-11-21 MED ORDER — BLOOD GLUCOSE MONITOR KIT: PACK | Status: AC

## 2013-11-21 NOTE — Telephone Encounter (Signed)
Received req from [facility] for pt's rxs and resent them there along with DM testing supplies.

## 2013-11-21 NOTE — Telephone Encounter (Signed)
[employer] mail order reqs RF of diazepam. Pended. I have not changed quantity.

## 2013-11-23 MED ORDER — DIAZEPAM 5 MG PO TABS: ORAL_TABLET | ORAL | Status: DC

## 2013-11-23 NOTE — Telephone Encounter (Signed)
Diazepam refill approved; please call into [facility] mail order.

## 2013-11-24 MED ORDER — DIAZEPAM 5 MG PO TABS: ORAL_TABLET | ORAL | Status: DC

## 2013-11-24 NOTE — Telephone Encounter (Signed)
Called in Rx to [facility]. Changed in system after checking w/Dr Tamala Julian with directions as to "...daily prn needed at onset of panic attack" as [employer] needed a max daily dosage.

## 2013-12-05 ENCOUNTER — Other Ambulatory Visit: Payer: Self-pay | Admitting: *Deleted

## 2013-12-05 NOTE — Telephone Encounter (Signed)
Pharmacy [facility] request refill diazepam

## 2013-12-06 NOTE — Telephone Encounter (Signed)
I approved and sent rx for Diazepam on 11/20/13.  Call [facility] and clarify WHY they are requesting refill so soon; did the patient request this or did [facility] request refill? I expect the 20 pills of Diazepam to last the patient all year.

## 2013-12-08 NOTE — Telephone Encounter (Signed)
Tried to call [employer], but their computers are down. Asked [place] to call back later.

## 2013-12-10 NOTE — Telephone Encounter (Signed)
[employer] reported that they did get the Rx called in on 11/24/13 and it shipped out 11/26/13. They do not have another req from pt at this time. Will deny request.

## 2014-01-12 ENCOUNTER — Ambulatory Visit: Payer: Medicare PPO | Admitting: Family Medicine

## 2014-01-20 ENCOUNTER — Ambulatory Visit (INDEPENDENT_AMBULATORY_CARE_PROVIDER_SITE_OTHER): Payer: Medicare PPO | Admitting: Ophthalmology

## 2014-02-19 ENCOUNTER — Other Ambulatory Visit: Payer: Self-pay

## 2014-02-19 DIAGNOSIS — E119 Type 2 diabetes mellitus without complications: Secondary | ICD-10-CM

## 2014-02-19 DIAGNOSIS — I10 Essential (primary) hypertension: Secondary | ICD-10-CM

## 2014-02-19 MED ORDER — METFORMIN HCL 500 MG PO TABS: 500.0000 mg | ORAL_TABLET | Freq: Two times a day (BID) | ORAL | Status: DC

## 2014-02-19 MED ORDER — HYDROCHLOROTHIAZIDE 25 MG PO TABS: 25.0000 mg | ORAL_TABLET | Freq: Every day | ORAL | Status: DC

## 2014-02-19 MED ORDER — LISINOPRIL 20 MG PO TABS: 20.0000 mg | ORAL_TABLET | Freq: Every day | ORAL | Status: DC

## 2014-02-23 ENCOUNTER — Telehealth: Payer: Self-pay

## 2014-02-23 ENCOUNTER — Emergency Department: Payer: Self-pay | Admitting: Emergency Medicine

## 2014-02-23 DIAGNOSIS — G459 Transient cerebral ischemic attack, unspecified: Secondary | ICD-10-CM

## 2014-02-23 HISTORY — DX: Transient cerebral ischemic attack, unspecified: G45.9

## 2014-02-23 NOTE — Telephone Encounter (Signed)
Patient requesting a referral to Dr. Gurney Maxin @ [facility] [place] # [phone]/// fax# [phone]. Patients call back number is [phone]

## 2014-02-24 NOTE — Telephone Encounter (Signed)
Pt called back, he was seen at [facility] for a TIA. He prefers to see a Neuro in [place]. Advised to get his records from [place] and come in to see you Dr. Tamala Julian. He will be in Thurs night.

## 2014-02-24 NOTE — Telephone Encounter (Signed)
Noted.  Needs hospital/ED follow-up for recent TIA.

## 2014-02-25 ENCOUNTER — Telehealth: Payer: Self-pay

## 2014-02-25 NOTE — Telephone Encounter (Signed)
Patient has [employer] insurance,so will need referral from PCP in order to go to Neurology, Dr Tamala Julian has previously indicated she needs to see him in order to refer. I have called him to advise him to come in for follow up, he voiced understanding, states he will come in tomorrow to see you. FYI

## 2014-02-25 NOTE — Telephone Encounter (Signed)
Patient is having "mini strokes; TIA" and went to [facility]. They told him to follow up with neurology. Dr Tamala Julian is listed as a PCP, please advise

## 2014-03-25 ENCOUNTER — Encounter: Payer: Self-pay | Admitting: Family Medicine

## 2014-03-25 ENCOUNTER — Ambulatory Visit (INDEPENDENT_AMBULATORY_CARE_PROVIDER_SITE_OTHER): Payer: Medicare PPO | Admitting: Family Medicine

## 2014-03-25 VITALS — BP 154/84 | HR 67 | Temp 98.1°F | Resp 16 | Ht 70.0 in | Wt 207.0 lb

## 2014-03-25 DIAGNOSIS — G458 Other transient cerebral ischemic attacks and related syndromes: Secondary | ICD-10-CM

## 2014-03-25 DIAGNOSIS — E119 Type 2 diabetes mellitus without complications: Secondary | ICD-10-CM | POA: Diagnosis not present

## 2014-03-25 DIAGNOSIS — I10 Essential (primary) hypertension: Secondary | ICD-10-CM

## 2014-03-25 DIAGNOSIS — E7800 Pure hypercholesterolemia, unspecified: Secondary | ICD-10-CM

## 2014-03-25 LAB — CBC WITH DIFFERENTIAL/PLATELET
Basophils Absolute: 0 10*3/uL (ref 0.0–0.1)
Basophils Relative: 0 % (ref 0–1)
Eosinophils Absolute: 0.1 10*3/uL (ref 0.0–0.7)
Eosinophils Relative: 3 % (ref 0–5)
HCT: 49.4 % (ref 39.0–52.0)
Hemoglobin: 16.7 g/dL (ref 13.0–17.0)
Lymphocytes Relative: 30 % (ref 12–46)
Lymphs Abs: 1.4 10*3/uL (ref 0.7–4.0)
MCH: 30.5 pg (ref 26.0–34.0)
MCHC: 33.8 g/dL (ref 30.0–36.0)
MCV: 90.3 fL (ref 78.0–100.0)
MPV: 10.5 fL (ref 8.6–12.4)
Monocytes Absolute: 0.5 10*3/uL (ref 0.1–1.0)
Monocytes Relative: 10 % (ref 3–12)
Neutro Abs: 2.7 10*3/uL (ref 1.7–7.7)
Neutrophils Relative %: 57 % (ref 43–77)
Platelets: 199 10*3/uL (ref 150–400)
RBC: 5.47 MIL/uL (ref 4.22–5.81)
RDW: 14.1 % (ref 11.5–15.5)
WBC: 4.8 10*3/uL (ref 4.0–10.5)

## 2014-03-25 LAB — COMPREHENSIVE METABOLIC PANEL
ALT: 38 U/L (ref 0–53)
AST: 31 U/L (ref 0–37)
Albumin: 4.3 g/dL (ref 3.5–5.2)
Alkaline Phosphatase: 81 U/L (ref 39–117)
BUN: 14 mg/dL (ref 6–23)
CO2: 28 mEq/L (ref 19–32)
Calcium: 9.7 mg/dL (ref 8.4–10.5)
Chloride: 100 mEq/L (ref 96–112)
Creat: 0.83 mg/dL (ref 0.50–1.35)
Glucose, Bld: 122 mg/dL — ABNORMAL HIGH (ref 70–99)
Potassium: 4.6 mEq/L (ref 3.5–5.3)
Sodium: 137 mEq/L (ref 135–145)
Total Bilirubin: 0.4 mg/dL (ref 0.2–1.2)
Total Protein: 7.2 g/dL (ref 6.0–8.3)

## 2014-03-25 LAB — HEMOGLOBIN A1C
Hgb A1c MFr Bld: 6.4 % — ABNORMAL HIGH (ref <5.7)
Mean Plasma Glucose: 137 mg/dL — ABNORMAL HIGH (ref <117)

## 2014-03-25 LAB — LIPID PANEL
Cholesterol: 186 mg/dL (ref 0–200)
HDL: 30 mg/dL — ABNORMAL LOW (ref >=40)
LDL Cholesterol: 93 mg/dL (ref 0–99)
Total CHOL/HDL Ratio: 6.2 Ratio
Triglycerides: 317 mg/dL — ABNORMAL HIGH (ref <150)
VLDL: 63 mg/dL — ABNORMAL HIGH (ref 0–40)

## 2014-03-25 MED ORDER — LISINOPRIL 10 MG PO TABS: 10.0000 mg | ORAL_TABLET | Freq: Every day | ORAL | Status: DC

## 2014-03-25 MED ORDER — LOVASTATIN 20 MG PO TABS: 20.0000 mg | ORAL_TABLET | Freq: Every day | ORAL | Status: DC

## 2014-03-25 NOTE — Patient Instructions (Signed)
Transient Ischemic Attack  A transient ischemic attack (TIA) is a "warning stroke" that causes stroke-like symptoms. Unlike a stroke, a TIA does not cause permanent damage to the brain. The symptoms of a TIA can happen very fast and do not last long. It is important to know the symptoms of a TIA and what to do. This can help prevent a major stroke or death.  CAUSES   · A TIA is caused by a temporary blockage in an artery in the brain or neck (carotid artery). The blockage does not allow the brain to get the blood supply it needs and can cause different symptoms. The blockage can be caused by either:  ¨ A blood clot.  ¨ Fatty buildup (plaque) in a neck or brain artery.  RISK FACTORS  · High blood pressure (hypertension).  · High cholesterol.  · Diabetes mellitus.  · Heart disease.  · The build up of plaque in the blood vessels (peripheral artery disease or atherosclerosis).  · The build up of plaque in the blood vessels providing blood and oxygen to the brain (carotid artery stenosis).  · An abnormal heart rhythm (atrial fibrillation).  · Obesity.  · Smoking.  · Taking oral contraceptives (especially in combination with smoking).  · Physical inactivity.  · A diet high in fats, salt (sodium), and calories.  · Alcohol use.  · Use of illegal drugs (especially cocaine and methamphetamine).  · Being male.  · Being African American.  · Being over the age of 55.  · Family history of stroke.  · Previous history of blood clots, stroke, TIA, or heart attack.  · Sickle cell disease.  SYMPTOMS   TIA symptoms are the same as a stroke but are temporary. These symptoms usually develop suddenly, or may be newly present upon awakening from sleep:  · Sudden weakness or numbness of the face, arm, or leg, especially on one side of the body.  · Sudden trouble walking or difficulty moving arms or legs.  · Sudden confusion.  · Sudden personality changes.  · Trouble speaking (aphasia) or understanding.  · Difficulty swallowing.  · Sudden  trouble seeing in one or both eyes.  · Double vision.  · Dizziness.  · Loss of balance or coordination.  · Sudden severe headache with no known cause.  · Trouble reading or writing.  · Loss of bowel or bladder control.  · Loss of consciousness.  DIAGNOSIS   Your caregiver may be able to determine the presence or absence of a TIA based on your symptoms, history, and physical exam. Computed tomography (CT scan) of the brain is usually performed to help identify a TIA. Other tests may be done to diagnose a TIA. These tests may include:  · Electrocardiography.  · Continuous heart monitoring.  · Echocardiography.  · Carotid ultrasonography.  · Magnetic resonance imaging (MRI).  · A scan of the brain circulation.  · Blood tests.  PREVENTION   The risk of a TIA can be decreased by appropriately treating high blood pressure, high cholesterol, diabetes, heart disease, and obesity and by quitting smoking, limiting alcohol, and staying physically active.  TREATMENT   Time is of the essence. Since the symptoms of TIA are the same as a stroke, it is important to seek treatment as soon as possible because you may need a medicine to dissolve the clot (thrombolytic) that cannot be given if too much time has passed. Treatment options vary. Treatment options may include rest, oxygen, intravenous (IV) fluids,   and medicines to thin the blood (anticoagulants). Medicines and diet may be used to address diabetes, high blood pressure, and other risk factors. Measures will be taken to prevent short-term and long-term complications, including infection from breathing foreign material into the lungs (aspiration pneumonia), blood clots in the legs, and falls. Treatment options include procedures to either remove plaque in the carotid arteries or dilate carotid arteries that have narrowed due to plaque. Those procedures are:  · Carotid endarterectomy.  · Carotid angioplasty and stenting.  HOME CARE INSTRUCTIONS   · Take all medicines prescribed  by your caregiver. Follow the directions carefully. Medicines may be used to control risk factors for a stroke. Be sure you understand all your medicine instructions.  · You may be told to take aspirin or the anticoagulant warfarin. Warfarin needs to be taken exactly as instructed.  ¨ Taking too much or too little warfarin is dangerous. Too much warfarin increases the risk of bleeding. Too little warfarin continues to allow the risk for blood clots. While taking warfarin, you will need to have regular blood tests to measure your blood clotting time. A PT blood test measures how long it takes for blood to clot. Your PT is used to calculate another value called an INR. Your PT and INR help your caregiver to adjust your dose of warfarin. The dose can change for many reasons. It is critically important that you take warfarin exactly as prescribed.  ¨ Many foods, especially foods high in vitamin K can interfere with warfarin and affect the PT and INR. Foods high in vitamin K include spinach, kale, broccoli, cabbage, collard and turnip greens, brussels sprouts, peas, cauliflower, seaweed, and parsley as well as beef and pork liver, green tea, and soybean oil. You should eat a consistent amount of foods high in vitamin K. Avoid major changes in your diet, or notify your caregiver before changing your diet. Arrange a visit with a dietitian to answer your questions.  ¨ Many medicines can interfere with warfarin and affect the PT and INR. You must tell your caregiver about any and all medicines you take, this includes all vitamins and supplements. Be especially cautious with aspirin and anti-inflammatory medicines. Do not take or discontinue any prescribed or over-the-counter medicine except on the advice of your caregiver or pharmacist.  ¨ Warfarin can have side effects, such as excessive bruising or bleeding. You will need to hold pressure over cuts for longer than usual. Your caregiver or pharmacist will discuss other  potential side effects.  ¨ Avoid sports or activities that may cause injury or bleeding.  ¨ Be mindful when shaving, flossing your teeth, or handling sharp objects.  ¨ Alcohol can change the body's ability to handle warfarin. It is best to avoid alcoholic drinks or consume only very small amounts while taking warfarin. Notify your caregiver if you change your alcohol intake.  ¨ Notify your dentist or other caregivers before procedures.  · Eat a diet that includes 5 or more servings of fruits and vegetables each day. This may reduce the risk of stroke. Certain diets may be prescribed to address high blood pressure, high cholesterol, diabetes, or obesity.  ¨ A low-sodium, low-saturated fat, low-trans fat, low-cholesterol diet is recommended to manage high blood pressure.  ¨ A low-saturated fat, low-trans fat, low-cholesterol, and high-fiber diet may control cholesterol levels.  ¨ A controlled-carbohydrate, controlled-sugar diet is recommended to manage diabetes.  ¨ A reduced-calorie, low-sodium, low-saturated fat, low-trans fat, low-cholesterol diet is recommended to manage obesity.  ·   Maintain a healthy weight.  · Stay physically active. It is recommended that you get at least 30 minutes of activity on most or all days.  · Do not smoke.  · Limit alcohol use even if you are not taking warfarin. Moderate alcohol use is considered to be:  ¨ No more than 2 drinks each day for men.  ¨ No more than 1 drink each day for nonpregnant women.  · Stop drug abuse.  · Home safety. A safe home environment is important to reduce the risk of falls. Your caregiver may arrange for specialists to evaluate your home. Having grab bars in the bedroom and bathroom is often important. Your caregiver may arrange for equipment to be used at home, such as raised toilets and a seat for the shower.  · Follow all instructions for follow-up with your caregiver. This is very important. This includes any referrals and lab tests. Proper follow up can  prevent a stroke or another TIA from occurring.  SEEK MEDICAL CARE IF:  · You have personality changes.  · You have difficulty swallowing.  · You are seeing double.  · You have dizziness.  · You have a fever.  · You have skin breakdown.  SEEK IMMEDIATE MEDICAL CARE IF:   Any of these symptoms may represent a serious problem that is an emergency. Do not wait to see if the symptoms will go away. Get medical help right away. Call your local emergency services (911 in [place] Do not drive yourself to the hospital.  · You have sudden weakness or numbness of the face, arm, or leg, especially on one side of the body.  · You have sudden trouble walking or difficulty moving arms or legs.  · You have sudden confusion.  · You have trouble speaking (aphasia) or understanding.  · You have sudden trouble seeing in one or both eyes.  · You have a loss of balance or coordination.  · You have a sudden, severe headache with no known cause.  · You have new chest pain or an irregular heartbeat.  · You have a partial or total loss of consciousness.  MAKE SURE YOU:   · Understand these instructions.  · Will watch your condition.  · Will get help right away if you are not doing well or get worse.  Document Released: 10/05/2004 Document Revised: 12/31/2012 Document Reviewed: 04/02/2013  [employer]® Patient Information ©2015 [employer]. This information is not intended to replace advice given to you by your health care provider. Make sure you discuss any questions you have with your health care provider.

## 2014-03-25 NOTE — Progress Notes (Addendum)
Subjective:    Patient ID: Todd Diaz, male    DOB: 11-May-1940, 74 y.o.   MRN: [id number]  03/25/2014  Needs referral to neurology; Transient Ischemic Attack; Diabetes; Hypertension; and Hyperlipidemia   HPI This 74 y.o. male presents for [place] for recent ED evaluation for recurrent TIA.    ED evaluation one month ago for R arm and leg weakness and mild tingling/numbness.  S/p CT scan head negative; CXR negative.  Upon review of records, no MRI brain performed; no carotid dopplers performed; no echo performed.  Negative work up.  Increased ASA to 350m daily and recommended neurology evaluation as outpatient.  No admission with this TIA.  Duration of symptoms a few hours.    [facility]  S/p MRI and carotid dopplers during that visit; no echo performed at that time.    No headache, blurred vision, diplopia, dizziness, slurred speech, dysphagia.  Wife must have a heart transplant soon; pt has missed eye exam due to stressors with wife.  [facility]  Has defibrillator since 04/2013.  Found 2 coronary blockages; s/p 2 stents; then onset of CHF; required admission.  Has 34 pounds of fluid diuresed.     2.  HTN: Patient reports good compliance with medication, good tolerance to medication, and good symptom control.  Taking HCTZ 224mdaily; taking Lisinopril 2062maily.   3.  Hyperlipidemia: not taking cholesterol medication.  Agreeable to taking.    4.  DMII: Patient reports good compliance with medication, good tolerance to medication, and good symptom control.  Checking sugars; checks once every two days; sugars have been really good.    5. Panic attacks: rarely taking Valium; no recurrent panic attacks with stressors.     S/p L cataract resected; s/p retinal tear surgery by MatZigmund Danielrowths starting to bother patient; [facility].   Review of Systems  Constitutional: Negative for fever, chills, diaphoresis, activity change, appetite change and fatigue.  Eyes: Negative for visual disturbance.  Respiratory: Negative for cough and shortness of breath.   Cardiovascular: Negative for chest pain, palpitations and leg swelling.  Endocrine: Negative for cold intolerance, heat intolerance, polydipsia, polyphagia and polyuria.  Neurological: Negative for dizziness, tremors, seizures, syncope, facial asymmetry, speech difficulty, weakness, light-headedness, numbness and headaches.    Past Medical History  Diagnosis Date  . Hypertension   . Diabetes mellitus   . Avascular necrosis of medial femoral condyle   . Hyperlipidemia   . Arthritis     Knees  . Erectile dysfunction   . Hypertrophy of prostate without urinary obstruction and other lower urinary tract symptoms (LUTS)   . Chronic prostatitis   . Hearing loss   . Infection of urinary tract     ASSESSMENT:Recurrent MRSA UTI F/B Dr. BloClayborn Bignessd Dr. CopJacqlyn Larsen Tobacco use disorder    Past Surgical History  Procedure Laterality Date  . Prostate surgery    . Bladder stone removal    . Partial knee arthroplasty  07/24/2011    Procedure: UNICOMPARTMENTAL KNEE;  Surgeon: RobLorn JunesD;  [facility]  Laterality: Left;  . Amputation  2009    L fifth finger surgery for near amputation. [place].  . Admission  03/09/2012    [facility]; 24 hour admission; LE numbness/tingling.  CT head, MRI  brain, carotid dopplers, 2D-echo.  . Eye surgery  08/09/2013    L cataract resection  . Retinal tear repair cryotherapy  08/09/2013    [name]  . Hernia repair  04/09/2013    3 repaired; [place].   No Known Allergies Current Outpatient Prescriptions  Medication Sig Dispense Refill  . Alcohol Swabs (B-D SINGLE USE SWABS REGULAR) PADS Test blood sugar daily. Dx code: E11.9 100 each 3  . aspirin EC 81 MG tablet Take 81 mg by  mouth daily.    . Blood Glucose Calibration (ACCU-CHEK COMPACT PLUS CONTROL) SOLN Test blood sugar daily. Dx code: E11.9 1 each 0  . Blood Glucose Monitoring Suppl (BLOOD GLUCOSE METER KIT AND SUPPLIES) KIT Test blood sugar daily. Dx code: E11.9 1 each 0  . diazepam (VALIUM) 5 MG tablet Take 1/2 to 1 po daily as needed with onset of panic attack 20 tablet 0  . glucose blood test strip Test blood sugar daily. Dx Code: E11.9 100 each 3  . hydrochlorothiazide (HYDRODIURIL) 25 MG tablet Take 1 tablet (25 mg total) by mouth daily. 90 tablet 0  . Lancets MISC Test blood sugar daily. Dx code: E11.9 100 each 3  . lisinopril (PRINIVIL,ZESTRIL) 10 MG tablet Take 1 tablet (10 mg total) by mouth daily. 90 tablet 0  . lovastatin (MEVACOR) 20 MG tablet Take 1 tablet (20 mg total) by mouth at bedtime. 90 tablet 1  . metFORMIN (GLUCOPHAGE) 500 MG tablet Take 1 tablet (500 mg total) by mouth 2 (two) times daily with a meal. 180 tablet 0  . VIAGRA 100 MG tablet take 1 tablet by mouth once daily if needed 8 tablet 0   No current facility-administered medications for this visit.       Objective:    BP 154/84 mmHg  Pulse 67  Temp(Src) 98.1 F (36.7 C) (Oral)  Resp 16  Ht '5\' 10"'  (1.778 m)  Wt 207 lb (93.895 kg)  BMI 29.70 kg/m2  SpO2 95% Physical Exam  Constitutional: He is oriented to person, place, and time. He appears well-developed and well-nourished. No distress.  HENT:  Head: Normocephalic and atraumatic.  Right Ear: External ear normal.  Left Ear: External ear normal.  Nose: Nose normal.  Mouth/Throat: Oropharynx is clear and moist.  Eyes: Conjunctivae and EOM are normal. Pupils are equal, round, and reactive to light.  Neck: Normal range of motion. Neck supple. Carotid bruit is not present. No thyromegaly present.  Cardiovascular: Normal rate, regular rhythm, normal heart sounds and intact distal pulses.  Exam reveals no gallop and no friction rub.   No murmur heard. Pulmonary/Chest: Effort  normal and breath sounds normal. He has no wheezes. He has no rales.  Abdominal: Soft. Bowel sounds are normal. He exhibits no distension and no mass. There is no tenderness. There is no rebound and no guarding.  Lymphadenopathy:    He has no cervical adenopathy.  Neurological: He is alert and oriented to person, place, and time. No cranial nerve deficit. He exhibits normal muscle tone. Coordination normal.  Skin: Skin is warm and dry. No rash noted. He is not diaphoretic.  Psychiatric: He has a normal mood and affect. His behavior is normal. Judgment and thought content normal.  Nursing note and vitals reviewed.       Assessment & Plan:   1. Type 2 diabetes mellitus without complication   2. Essential hypertension, benign   3. Pure hypercholesterolemia   4. Other specified transient cerebral ischemias  1.  History of recent TIA: recurrent symptoms twice in past six months; [facility] 10/[facility]; normal neurological exam in office; continue ASA 393m daily.  Warrants aggressive control of blood pressure and cholesterol and sugars.  Pt expressed understanding; refer to neurology due to recurrent nature of TIA. [facility]pplers 10/2013; need to obtain records of these results.  Warrants repeat echo; may warrant Event Monitor as well. Will refer to cardiology. 2. HTN: elevated today; rx for Lisinopril 128mdaily provided; continue HCTZ 2551maily.  Obtain labs.    Clarify when patient started 15m77m Lisinopril; previous 20mg9mscribed.  Will switch 15mg 15m0mg d37m. 3.  Hypercholesterolemia: uncontrolled; start Lovastatin 20mg da60m 4. DMII: controlled; obtain labs; continue Metformin daily.   Meds ordered this encounter  Medications  . lovastatin (MEVACOR) 20 MG tablet    Sig: Take 1 tablet (20 mg total) by mouth at bedtime.    Dispense:  90 tablet    Refill:  1  . lisinopril (PRINIVIL,ZESTRIL) 10 MG tablet    Sig: Take 1 tablet  (10 mg total) by mouth daily.    Dispense:  90 tablet    Refill:  0    Return in about 3 months (around 06/25/2014) for recheck cholesterol, blood pressure.     Kristi Martin SElayne Guerinrgent [facility]o[address] [place] [phone]) [phone]

## 2014-04-04 ENCOUNTER — Encounter: Payer: Self-pay | Admitting: Family Medicine

## 2014-04-04 MED ORDER — LISINOPRIL 20 MG PO TABS: 20.0000 mg | ORAL_TABLET | Freq: Every day | ORAL | Status: DC

## 2014-04-16 NOTE — Addendum Note (Signed)
Addended by: Wardell Honour on: 04/16/2014 11:26 AM   Modules accepted: Orders

## 2014-05-01 NOTE — Discharge Summary (Signed)
PATIENT NAME:  Todd Diaz, Todd Diaz MR#:  [id number] DATE OF BIRTH:  1940/06/19  DATE OF ADMISSION:  04/08/2012 DATE OF DISCHARGE:  04/09/2012  REASON FOR ADMISSION: Numbness of the right lower extremity.   DISCHARGE DIAGNOSES: 1.  Numbness of the right lower extremity likely due to back problems. 2.  Hypertension. 3.  Hyperlipidemia. 4.  History of statin myopathy.  5.  Diabetes, well-controlled.   HOSPITAL COURSE: This is a 74 year old gentleman with history of diabetes, hyperlipidemia and hypertension who presented to the ER with the complaint of right lower extremity felt numb and hot.    He had some muscle tightness in the back area and light sensations of numbness and muscle spasms occasionally of his back and neck.   The patient was admitted for evaluation of a possible stroke due to the new onset weakness.   CT scan of the brain was negative and the patient was admitted for full evaluation.   As far as his possible CVA on admission, it turned out not to be a CVA.  His MRI was negative. His carotid Doppler showed possible stenosis of 85%, but with good anterograde flow.  Due to these findings, we did a CT angio of the neck which only showed a 50% stenosis, no major problems, for what the patient is just recommended to continue a good diet and cholesterol medications. We know that the patient had a history of elevated CK and CK-MB due to statins in the past with muscle aches, but he states that he tried Crestor from his wife without having any problems for what we have given him a prescription for Crestor. Other than that, the patient did very well. Hypertension was well controlled. Diabetes is well controlled. For hyperlipidemia, again, we added niacin and fish oil and the patient is going to try Crestor. He has good recommendations as far as when to go to see his primary care physician if there is any increased pain or myopathy.   He had an echocardiogram showing an ejection fraction of 50%  to 55% with low normal global left ventricular systolic function and diastolic filling relaxation pattern/diastolic dysfunction.   He had mild aortic valve sclerosis without stenosis and no source of embolism was identified.   DISCHARGE MEDICATIONS:  The patient was discharged on the following: 1.  Niaspan ER 500 mg take 2 once a day. 2.  Metformin 500 mg twice daily. 3.  Aspirin 81 mg once a day. 4.  Lisinopril 20 mg once a day. 5.  Hydrochlorothiazide 25 mg once a day. 6.  Crestor 5 mg once a day.  DISCHARGE FOLLOWUP:  With Dr. Sonia Baller. We recommend possible work-up for lower back including spine films and possible CT scan of MRI if necessary for evaluation of possible radiculopathy.  We did not complete this workup at this admission as the patient was admitted only for observation and this was not urgent and could be done outpatient.   TIME SPENT: I spent about 35 minutes with this discharge. ____________________________ Etna Sink, MD rsg:sb D: 04/10/2012 07:25:38 ET T: 04/10/2012 07:42:31 ET JOB#: [id number]  cc: Antelope Sink, MD, <Dictator> Renette Butters. Tamala Julian, MD Roselie Awkward America Brown MD ELECTRONICALLY SIGNED 04/18/2012 22:32

## 2014-05-01 NOTE — H&P (Signed)
PATIENT NAME:  Todd Diaz, Todd Diaz MR#:  [id number] DATE OF BIRTH:  1940/12/22  DATE OF ADMISSION:  04/08/2012  REFERRING PHYSICIAN:  Lurline Hare, MD  PRIMARY CARE PHYSICIAN: Reginia Forts, MD   CHIEF COMPLAINT: Right lower extremity numbness.   HISTORY OF PRESENT ILLNESS: This is a 74 year old male with significant past medical history of diabetes, hyperlipidemia and hypertension who presents with complaint of right lower extremity hot feeling. He described it as muscle tightness in the back area and sensation like numbness or spasm, which prompted him to come to the ED.  The patient denies any focal deficits, any slurred speech, any loss of consciousness, any altered mental status, any dysarthria or facial droop. In the ED, the patient was examined by the ED physician who noticed mild decrease in motor strength in his right lower extremity, 4 out of 5, without any other focal deficits. The patient had CT of the brain done in the ED which did show a few focal hypodensities suspicious for subacute or chronic lacunar infarcts in left frontal subcortical area and right basal ganglia. The patient was given 324 mg of aspirin in the ED.  At the time of my exam, the patient reports his symptoms have been resolved and he did not have any weakness in the right lower extremity. The patient denies any previous history of CVA. His EKG is showing normal sinus rhythm. Denies any history of coronary artery disease or peripheral vascular disease, denies chest pain, denies shortness of breath. The patient's labs were significant for an elevated total CK of 237 and elevated CK-MB at 14.2 with normal troponins. Hospitalist service was requested to admit the patient for further work-up of his symptoms.  PAST MEDICAL HISTORY: 1.  Diabetes.  2.  Hyperlipidemia.  3.  Hypertension.  4.  Stone in prostate.  5.  Statin myopathy.   PAST SURGICAL HISTORY: 1.  I and D of left fifth finger with fusion.  2.  Stone removed from  prostate.    SOCIAL HISTORY: The patient has history of previous smoking, quit long time ago, he reports. No history of alcohol or illicit drug use.   FAMILY HISTORY: Significant for coronary artery disease and hypertension in his father and lung cancer in the mother.   ALLERGIES: No known drug allergies, but as mentioned earlier the patient reports having myopathy due to statin.   HOME MEDICATIONS: 1.  Aspirin 81 mg oral daily.  2.  Metformin 500 mg oral daily. 3.  Niaspan ER 500 mg 2 tablets oral daily.  4.  Hydrochlorothiazide 25 mg oral daily. 5.  Lisinopril 20 mg oral daily.   REVIEW OF SYSTEMS: CONSTITUTIONAL:  Denies fever, chills, fatigue, weight gain or weight loss.  EYES: Denies blurry vision, double vision, pain, inflammation or glaucoma.  ENT: Denies tinnitus, ear pain, hearing loss, epistaxis or discharge.  RESPIRATORY: Denies cough, wheezing, hemoptysis, dyspnea or COPD.  CARDIOVASCULAR: Denies chest pain, edema, arrhythmia, palpitations or syncope.  GASTROINTESTINAL: Denies nausea, vomiting, diarrhea, abdominal pain, hematemesis, jaundice, rectal bleed or coffee-ground emesis.  GENITOURINARY: Denies dysuria, hematuria or renal colic. ENDOCRINE:  Denies polyuria, polydipsia, heat or cold intolerance. Has history of diabetes.  HEMATOLOGY: Denies anemia, easy bruising or bleeding diathesis.  INTEGUMENTARY: Denies acne or rash.  MUSCULOSKELETAL: Reports history of arthritis. Denies gout, limited activity.   NEUROLOGIC:  Reports odd feeling in the right lower extremity. Describes it as funny sensation, muscle tightness/numbness. Denies any history of CVA, TIA or seizures in the past. Denies headache,  dementia, ataxia, dysarthria, facial droop or vertigo.  PSYCHIATRIC: Denies anxiety, insomnia, bipolar disorder, schizophrenia, substance or alcohol abuse.   PHYSICAL EXAMINATION: VITAL SIGNS: Temperature 98.1, pulse 77, respiratory rate 20, blood pressure 153/87, saturating 98%  on room air.  GENERAL: Elderly male, well-nourished who looks comfortable and in no apparent distress.  HEENT: Head atraumatic, normocephalic. Pupils equal and reactive to light. Pink conjunctivae. Anicteric sclerae. Moist oral mucosa.  NECK: Supple. No thyromegaly. No JVD.  CHEST: Good air entry bilaterally. No wheezing, rales or rhonchi. HEART:  S1 and S2 heard. No rubs, murmurs or gallops.  ABDOMEN: Soft, nontender and nondistended. Bowel sounds present.  EXTREMITIES: No edema. No clubbing. No cyanosis. Dorsalis pedis pulses +2 bilaterally.  PSYCHIATRIC: Appropriate affect. Awake and alert x 3. Intact judgment and insight.  NEUROLOGIC:  Cranial nerves grossly intact. Motor 5/5 in all extremities. Sensation symmetrical and intact. Negative leg raise test.  SKIN: Normal skin turgor. Warm and dry.   LABORATORY AND DIAGNOSTICS:  Glucose 129, BUN 13, creatinine 0.96, sodium 138, potassium 4.3, chloride 103, CO2 32, ALT 39, AST 41, alk phos 41. Troponin less than 0.02. CK total 587. CK-MB 14.2. Urinalysis negative. White blood cells 7.6, hemoglobin 15.9, hematocrit 46, platelets 197.   CT of brain without contrast is showing a few focal hypodensities suspicious for subacute or chronic infarct in left subcortical lobe and right basal ganglia.   ASSESSMENT AND PLAN: 1.  Cerebrovascular accident. By the time of my exam, the patient has all of his symptoms resolved, but there is findings on CT of brain suspicious for cerebrovascular accident, so we will proceed cerebrovascular accident workup. The patient will be admitted with telemetry unit. Will check his MRI, carotid Dopplers and 2-D echo, especially with 2 lesions with different blood vessel distribution that were found with CT of the brain. The patient will be monitored on tele and will be started on subcutaneous heparin for deep venous thrombosis prophylaxis. Will change his aspirin from 81 to 325 mg oral daily. Unfortunately, the patient cannot  tolerate statin due to his statin myopathy, so we will not try him on a statin, especially with his elevated CK.  2.  Hypertension. Will hold his medications to allow for blood pressure up to 180 secondary to suspicion for cerebrovascular accident. 3.  Diabetes mellitus. Will hold oral agents. We will have him on insulin sliding scale.  4.  Hyperlipidemia. We will check lipid panel in the a.m. We will continue him on statin. Will add fish oil as well. We will hold on starting him on a statin secondary to his history of statin myopathy, especially with elevated, CK and CK-MB.  5.  Elevated CK and CK-MB. We will continue to monitor closely. Will recheck in a.m. Unclear if the patient has some form of myopathy, especially that his symptoms include some muscle spasm or tightness, so we will monitor the level closely.  6.  Deep vein thrombosis prophylaxis. Subcutaneous heparin.   CODE STATUS: The patient is FULL CODE.   TOTAL TIME SPENT ON ADMISSION AND PATIENT CARE:  60 minutes.  ____________________________ Albertine Patricia, MD dse:sb D: 04/08/2012 05:04:17 ET T: 04/08/2012 07:33:36 ET JOB#: [id number]  cc: Albertine Patricia, MD, <Dictator> DAWOOD Graciela Husbands MD ELECTRONICALLY SIGNED 04/09/2012 1:48

## 2014-05-02 NOTE — H&P (Signed)
PATIENT NAME:  Todd Diaz, Todd Diaz MR#:  [id number] DATE OF BIRTH:  22-Sep-1940  DATE OF ADMISSION:  10/22/2013  REFERRING PHYSICIAN:  Conni Slipper, MD.  PRIMARY CARE PHYSICIAN:  Renette Butters. Tamala Julian, MD.   ADMISSION DIAGNOSIS: Stroke.   HISTORY OF PRESENT ILLNESS: This is a 74 year old Caucasian male who presents to the Emergency Department approximately 24 hours after the onset of right arm and leg numbness. The patient states that more accurately he was feeling some tingling in his leg as if his "arm and leg had fallen asleep." Now both of his extremities feel normal as of approximately 1 hour prior to admission. He denies feeling weak. He has not had any falls. He did not have any associated chest pain or diaphoresis with this episode that began yesterday. He also denies feeling unwell or having any dizziness or dysarthria. Due to his prolonged symptoms the Emergency Department called for admission.   REVIEW OF SYSTEMS:  GENERAL: The patient denies fever or weakness.  EYES: Denies diplopia or inflammation.  EARS, NOSE AND THROAT: Denies tinnitus or sore throat.  RESPIRATORY: Denies cough or wheezing.  CARDIOVASCULAR: Denies chest pain, palpitations or dyspnea on exertion.  GASTROINTESTINAL: The patient denies nausea, vomiting, diarrhea or abdominal pain.  GENITOURINARY: Denies dysuria, increased frequency or hesitancy.  ENDOCRINE: Denies polyuria or polydipsia.  HEMATOLOGIC AND LYMPHATIC: Denies easy bruising or bleeding.  INTEGUMENTARY: Denies rashes or lesions.  NEUROLOGIC: Admits to paresthesias in his right arm and leg that are currently resolved, but denies dysarthria.  PSYCHIATRIC: The patient denies depression or suicidal ideation.   PAST MEDICAL HISTORY: Diabetes type 2, hypertension, blepharitis and hyperlipidemia.   PAST SURGICAL HISTORY: Prostate resection, partial right knee repair, umbilical hernia repair, as well as bilateral cataract removal.   FAMILY HISTORY: Hypertension,  coronary artery disease (his father is deceased of a cerebral aneurysm but had had a heart attack in his early 16s).   SOCIAL HISTORY: The patient is married. He lives with his wife. He is a former smoker but quit approximately 30 years ago. He drinks beer on the weekend, but denies any illicit drug use.   MEDICATIONS:  1.  Aspirin 81 mg 1 tablet p.o. daily.  2.  Crestor 5 mg 1 tablet p.o. daily.  3.  Hydrochlorothiazide 25 mg 1 tablet p.o. daily.  4.  Lisinopril 20 mg 1 tablet p.o. daily.  5.  Metformin 500 mg 1 tablet p.o. b.i.d.  6.  Niaspan ER 500 mg extended release 2 tablets p.o. at bedtime.    ALLERGIES: No known drug allergies.   PERTINENT LABORATORY RESULTS AND RADIOGRAPHIC FINDINGS: Serum glucose is 128, BUN is 16, creatinine 0.9. The rest of the electrolyte panel is normal. Alkaline phosphatase is 93, AST is 44, ALT is 48, troponin is negative. White blood cell count is 5.5, hemoglobin is 15.4, hematocrit is 46.9.   CT of the head is negative for ischemia or bleed. There are air-fluid levels within the left maxillary sinus that could be seen in the setting of acute sinusitis.   Ultrasound ordered by me shows a suspected progression of left-sided atherosclerotic plaque, though not resulting in hemodynamically significant stenosis. There is also grossly unchanged right-sided atherosclerotic plaque not resulting in hemodynamically significant stenosis.   PHYSICAL EXAMINATION:  VITAL SIGNS: Temperature 97.9, pulse 60, respiratory rate 16, blood pressure is 144/88, pulse oximetry is 97% on room air.  GENERAL: The patient is alert and oriented x 3 in no apparent distress.  HEENT: Normocephalic, atraumatic. Pupils equal,  round, and reactive to light and accommodation. Mucous membranes are moist.  NECK: Trachea is midline. No adenopathy.  CHEST: Symmetric, atraumatic.  CARDIOVASCULAR: Regular rate and rhythm. Normal S1, S2. No rubs, clicks, or murmurs.  LUNGS: Clear to auscultation  bilaterally. Normal effort and excursion.  ABDOMEN: Positive bowel sounds. Soft, nontender, nondistended. No hepatosplenomegaly.  GENITOURINARY: Deferred.  MUSCULOSKELETAL: The patient moves all 4 extremities equally but he has 4+/5 strength in the right upper extremity and right lower extremity.  SKIN: No rashes or lesions.  NEUROLOGIC: Cranial nerves II-XII are grossly intact. The patient's reflexes in lower extremities are 2+ and symmetric. The patient's upper right extremity has a brisk brachioradialis radialis reflex, but on the left it is difficult to elicit.  PSYCHIATRIC: Mood is normal. Affect is congruent.   ASSESSMENT AND PLAN: This is a 74 year old male admitted for cerebrovascular accident.  1.  Cerebrovascular accident. Right-sided, presumably left MCA distribution. The patient does not have any deficits at this time and his strength is nearly completely resolved. Again, the muscle strength is very subtly weaker on the right at this moment than on the left. His carotid ultrasounds do not show significant hemodynamic disease. I will leave scheduling of the MRI to the primary team tomorrow. We will continue aspirin for now for secondary prevention.  2.  Hypertension. Currently controlled.  We will continue lisinopril and hydrochlorothiazide.  3.  Diabetes type 2. Continue metformin. We will also have the patient on sliding scale insulin while hospitalized.  4.  Hyperlipidemia. Continue Crestor and niacin.  5.  DVT prophylaxis. Heparin.  6.  GI prophylaxis. Unnecessary, as the patient is not critically ill.   CODE STATUS: The patient is a full code.   Time spent on admission orders: Approximately 35 minutes.    ____________________________ Norva Riffle. Marcille Blanco, MD msd:lt D: 10/22/2013 05:25:03 ET T: 10/22/2013 07:21:19 ET JOB#: [id number]  cc: Norva Riffle. Marcille Blanco, MD, <Dictator> Norva Riffle Payden Bonus MD ELECTRONICALLY SIGNED 10/30/2013 0:20

## 2014-05-02 NOTE — Discharge Summary (Signed)
PATIENT NAME:  Todd Diaz, Todd Diaz MR#:  [id number] DATE OF BIRTH:  02-01-1940  DATE OF ADMISSION:  10/22/2013 DATE OF DISCHARGE:  10/22/2013  For a detailed note, please take a look at the history and physical done on admission by Dr. Marcille Blanco.   DIAGNOSES AT DISCHARGE: As follows:  1. Acute cerebrovascular accident.  2. Diabetes. Hypertension,  3. Hyperlipidemia.   DIET: The patient was discharged home on a low-sodium, low-fat, carb-controlled diet.   ACTIVITY: As tolerated.   FOLLOWUP: With his primary care physician in the next 1 to 2 weeks.   DISCHARGE MEDICATIONS: Metformin 500 mg b.i.d., aspirin 325 mg daily, lisinopril 20 mg daily, hydrochlorothiazide 25 mg daily, Crestor 5 mg daily.   PERTINENT STUDIES: Done during the hospital course are as follows: A CT scan of the head done without contrast on admission showing no acute intracranial abnormality. An ultrasound of the carotids showing no evidence of any hemodynamically significant carotid artery stenosis. An MRI of the brain done without contrast showing punctate acute left frontal parietal infarcts. Mild chronic small vessel ischemic disease.   HOSPITAL COURSE: This is a 74 year old male with medical problems as mentioned above, presented to the hospital on 10/22/2013 due to right-sided numbness and tingling.  1. Acute CVA. This was likely the cause of the patient's right-sided numbness and tingling. The patient's CT of the head on admission was negative, although his MRI of the brain did confirm an acute small punctate stroke in the left frontal side. Carotid duplex was negative. I discussed the case with neurology. They recommended increasing his aspirin from 81 mg to a full dose aspirin at 325 and continue his statin. The patient was eligible to receive any PT or OT services as he was clinically stable.  2. Hypertension. The patient remained hemodynamically stable. He will continue his hydrochlorothiazide and lisinopril.  3. Diabetes.  The patient was maintained on his metformin. He will resume that.  4. History of coronary artery disease. The patient had no acute chest pain. He will continue his aspirin and statin as mentioned.   CODE STATUS: The patient is a full code.   TIME SPENT: 35 minutes.    ____________________________ Belia Heman. Verdell Carmine, MD vjs:TT D: 10/30/2013 16:26:57 ET T: 10/31/2013 09:00:06 ET JOB#: [id number]  cc: Belia Heman. Verdell Carmine, MD, <Dictator> Henreitta Leber MD ELECTRONICALLY SIGNED 11/17/2013 10:55

## 2014-05-05 ENCOUNTER — Ambulatory Visit (INDEPENDENT_AMBULATORY_CARE_PROVIDER_SITE_OTHER): Payer: Medicare PPO | Admitting: Neurology

## 2014-05-05 ENCOUNTER — Encounter: Payer: Self-pay | Admitting: Neurology

## 2014-05-05 VITALS — BP 148/90 | HR 68 | Resp 18 | Ht 70.0 in | Wt 208.8 lb

## 2014-05-05 DIAGNOSIS — I10 Essential (primary) hypertension: Secondary | ICD-10-CM

## 2014-05-05 DIAGNOSIS — E7800 Pure hypercholesterolemia, unspecified: Secondary | ICD-10-CM

## 2014-05-05 DIAGNOSIS — E119 Type 2 diabetes mellitus without complications: Secondary | ICD-10-CM

## 2014-05-05 DIAGNOSIS — R200 Anesthesia of skin: Secondary | ICD-10-CM

## 2014-05-05 DIAGNOSIS — R208 Other disturbances of skin sensation: Secondary | ICD-10-CM

## 2014-05-05 DIAGNOSIS — G459 Transient cerebral ischemic attack, unspecified: Secondary | ICD-10-CM

## 2014-05-05 MED ORDER — CLOPIDOGREL BISULFATE 75 MG PO TABS: 75.0000 mg | ORAL_TABLET | Freq: Every day | ORAL | Status: DC

## 2014-05-05 NOTE — Progress Notes (Signed)
NEUROLOGY CONSULTATION NOTE  Todd Diaz MRN: [id number] DOB: 01-Sep-1940  Referring provider: Dr. Tamala Julian Primary care provider: Dr. Tamala Julian  Reason for consult:  TIA  HISTORY OF PRESENT ILLNESS: Todd Diaz is a 74 year old right-handed man with hypertension, hyperlipidemia, type 2 diabetes and history of panic attacks who presents for TIA.  Records, labs and CT report from February reviewed.  He presented to [facility] on 10/23/13 with sudden onset of right arm and leg numbness and weakness while watching TV.  He reportedly had an MRI of the brain and carotid dopplers.  Symptoms resolved by the next morning.  He was told "there was a spot on the brain".  Carotid doppler was okay.  He was already on ASA 66m daily which was unchanged.  I don't have any records from this visit.  [facility] right upper and lower extremity weakness with paresthesias.  He was out of the window for tPA.  CT of head was reportedly negative.  Symptoms lasted about 2 hours.  His ASA was increased to 3240mand discharged from the ED with instructions for neurology follow-up.  He reportedly had another carotid doppler which was normal.  Lipid panel from 03/25/14 showed cholesterol 186, HDL 30 and LDL 93.  Hgb A1c was 6.4 with mean plasma glucose of 137.  He is scheduled for a 2D echo.  For the past couple of months, he notices numbness in the back of his right leg from the thigh down to the foot.  It only occurs when he is sitting in certain chairs or while driving.  There is no associated weakness, pain or back pain, although he has a history of back pain.  It resolves when he stands up and walks.  PAST MEDICAL HISTORY: Past Medical History  Diagnosis Date  . Hypertension   . Diabetes mellitus   . Avascular necrosis of medial femoral condyle   . Hyperlipidemia   . Arthritis     Knees  . Erectile dysfunction   . Hypertrophy of prostate without urinary obstruction and other lower  urinary tract symptoms (LUTS)   . Chronic prostatitis   . Hearing loss   . Infection of urinary tract     ASSESSMENT:Recurrent MRSA UTI F/B Dr. BlClayborn Bignessnd Dr. CoJacqlyn Larsen. Tobacco use disorder   . TIA (transient ischemic attack) 02/23/2014    R sided weakness and numbness; duration 1 hour; evaluated [facility].    PAST SURGICAL HISTORY: Past Surgical History  Procedure Laterality Date  . Prostate surgery    . Bladder stone removal    . Partial knee arthroplasty  07/24/2011    Procedure: UNICOMPARTMENTAL KNEE;  Surgeon: RoLorn JunesMD;  [facility];  Laterality: Left;  . Amputation  2009    L fifth finger surgery for near amputation. [place].  . Admission  03/09/2012    [facility]; 24 hour admission; LE numbness/tingling.  CT head, MRI brain, carotid dopplers, 2D-echo.  . Eye surgery  08/09/2013    L cataract resection  . Retinal tear repair cryotherapy  08/09/2013    MaZigmund Daniel. Hernia repair  04/09/2013    3 repaired; Gross.    MEDICATIONS: Current Outpatient Prescriptions on File Prior to Visit  Medication Sig Dispense Refill  . Alcohol Swabs (B-D SINGLE USE SWABS REGULAR) PADS Test blood sugar daily. Dx code: E11.9 100 each 3  . aspirin EC 81 MG tablet Take 81 mg by mouth  daily.    . Blood Glucose Calibration (ACCU-CHEK COMPACT PLUS CONTROL) SOLN Test blood sugar daily. Dx code: E11.9 1 each 0  . Blood Glucose Monitoring Suppl (BLOOD GLUCOSE METER KIT AND SUPPLIES) KIT Test blood sugar daily. Dx code: E11.9 1 each 0  . diazepam (VALIUM) 5 MG tablet Take 1/2 to 1 po daily as needed with onset of panic attack 20 tablet 0  . glucose blood test strip Test blood sugar daily. Dx Code: E11.9 100 each 3  . hydrochlorothiazide (HYDRODIURIL) 25 MG tablet Take 1 tablet (25 mg total) by mouth daily. 90 tablet 0  . Lancets MISC Test blood sugar daily. Dx code: E11.9 100 each 3  . lisinopril (PRINIVIL,ZESTRIL) 20 MG tablet Take 1 tablet (20 mg total) by mouth daily. 90 tablet 3  .  lovastatin (MEVACOR) 20 MG tablet Take 1 tablet (20 mg total) by mouth at bedtime. 90 tablet 1  . metFORMIN (GLUCOPHAGE) 500 MG tablet Take 1 tablet (500 mg total) by mouth 2 (two) times daily with a meal. 180 tablet 0  . VIAGRA 100 MG tablet take 1 tablet by mouth once daily if needed (Patient not taking: Reported on 05/05/2014) 8 tablet 0   No current facility-administered medications on file prior to visit.    ALLERGIES: No Known Allergies  FAMILY HISTORY: Family History  Problem Relation Age of Onset  . Hypertension Mother   . Cancer Mother     lung  . Heart attack Father   . Hypertension Father   . Hyperlipidemia Father   . Aneurysm Father 29    Brain  . Diabetes Son     SOCIAL HISTORY: History   Social History  . Marital Status: Widowed    Spouse Name: N/A  . Number of Children: 4  . Years of Education: 12   Occupational History  . 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 Smoking status: Former Smoker -- 0.25 packs/day for 20 years    Types: Cigarettes    Quit date: 01/09/1986  . Smokeless tobacco: Current User    Types: Chew     Comment: Quit in the 1990's uses chewing tobacco  . Alcohol Use: 0.0 oz/week    0 Standard drinks or equivalent per week     Comment: occasional 12 beers per weekend night.  DUI in 2000.  . Drug Use: No  . Sexual Activity:    Partners: Female    Museum/gallery curator: None   Other Topics Concern  . Not on file   Social History Narrative   Widowed since 02/2009 after 84 years second marriage; engaged in 01/2011.        Children: 6 children, 2 stepchildren; 8 grandchildren, 2 gg.      Lives: with fiance, cat, dog.      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      Tobacco: quit smoking age 44; smoked x 22 years.      Alcohol:  Beer every weekend (12 pack-24 pack).      Seatbelt:  Always uses seat belts, Smoke alarm in the home, >5 guns in the home loaded and  locked up.   Caffeine CWC:BJSEGB, Tea, Carbonated beverages; consumes a moderate amount.   Exercise: Inactive.   LIVING WILL; FULL CODE, No HCPOA.    REVIEW OF SYSTEMS: Constitutional: No fevers, chills, or sweats, no generalized fatigue, change in appetite Eyes:  No visual changes, double vision, eye pain Ear, nose and throat: No hearing loss, ear pain, nasal congestion, sore throat Cardiovascular: No chest pain, palpitations Respiratory:  No shortness of breath at rest or with exertion, wheezes GastrointestinaI: No nausea, vomiting, diarrhea, abdominal pain, fecal incontinence Genitourinary:  No dysuria, urinary retention or frequency Musculoskeletal:  No neck pain, back pain Integumentary: No rash, pruritus, skin lesions Neurological: as above Psychiatric: depression related to his wife being ill. Endocrine: No palpitations, fatigue, diaphoresis, mood swings, change in appetite, change in weight, increased thirst Hematologic/Lymphatic:  No anemia, purpura, petechiae. Allergic/Immunologic: no itchy/runny eyes, nasal congestion, recent allergic reactions, rashes  PHYSICAL EXAM: Filed Vitals:   05/05/14 0954  BP: 148/90  Pulse: 68  Resp: 18   General: No acute distress Head:  Normocephalic/atraumatic Eyes:  fundi unremarkable, without vessel changes, exudates, hemorrhages or papilledema. Neck: supple, no paraspinal tenderness, full range of motion Back: No paraspinal tenderness Heart: regular rate and rhythm Lungs: Clear to auscultation bilaterally. Vascular: No carotid bruits. Neurological Exam: Mental status: alert and oriented to person, place, and time, recent and remote memory intact, fund of knowledge intact, attention and concentration intact, speech fluent and not dysarthric, language intact. Cranial nerves: CN I: not tested CN II: pupils equal, round and reactive to light, visual fields intact, fundi unremarkable, without vessel changes, exudates, hemorrhages or  papilledema. CN III, IV, VI:  full range of motion, no nystagmus, no ptosis CN V: facial sensation intact CN VII: upper and lower face symmetric CN VIII: hearing intact CN IX, X: gag intact, uvula midline CN XI: sternocleidomastoid and trapezius muscles intact CN XII: tongue midline Bulk & Tone: normal, no fasciculations. Motor:  5/5 througout Sensation:  Pinprick and vibration intact. Deep Tendon Reflexes:  2+throughout, toes downgoing Finger to nose testing:  No dysmetria Heel to shin:  No dysmetria Gait:  Mild stumbling but overall good stride.  Normal station.  Able to walk in tandem. Romberg negative.  IMPRESSION: TIA Intermittent right leg numbness, suspect possible mild right S1 radiculopathy.  In absence of pain and weakness, is nothing urgent but he would like to work this up.  PLAN: 1.  Given that he had another TIA while on 45m of ASA, I favor switching ASA to Plavix 761mdaily for secondary stroke prevention.  I ordered a prescription. 2.  I would optimize statin management as LDL goal should be less than 70.  I defer to his PCP to decide on appropriate changes. 3.  Follow up on 2D echo 4.  Will get rest of [facility].  Diabetes and blood pressure control 6.  Heart healthy diet 7.  NCV-EMG of right leg to assess right leg numbness. 8.  Follow up in 3 months.  Thank you for allowing me to take part in the care of this patient.  AdMetta ClinesDO  CC:  KrReginia FortsMD

## 2014-05-05 NOTE — Patient Instructions (Signed)
I would stop the aspirin and instead take Plavix 75mg  daily.  I will place a prescription to your pharmacy. I think your LDL (the bad cholesterol) should be a little lower so I would discuss with your PCP about increasing the cholesterol medication I think the leg problem is coming from the back.  Will get a nerve conduction study of the right leg Follow up in 3 months.

## 2014-06-01 ENCOUNTER — Ambulatory Visit: Payer: Medicare PPO | Admitting: Internal Medicine

## 2014-06-11 ENCOUNTER — Telehealth: Payer: Self-pay | Admitting: Family Medicine

## 2014-06-11 NOTE — Telephone Encounter (Signed)
Patient states that his pharmacy will not refill his Diazepam. When asked why they won't refill it, patient could not give a clear answer. Patient uses [facility] on [address] in [place].   [phone]

## 2014-06-11 NOTE — Telephone Encounter (Signed)
Refill request

## 2014-06-12 MED ORDER — DIAZEPAM 5 MG PO TABS: ORAL_TABLET | ORAL | Status: DC

## 2014-06-12 NOTE — Addendum Note (Signed)
Addended by: Wardell Honour on: 06/12/2014 02:30 PM   Modules accepted: Orders

## 2014-06-12 NOTE — Telephone Encounter (Signed)
Rs sent in. Pt notified on voicemail.

## 2014-06-12 NOTE — Telephone Encounter (Signed)
Please fax or call in refill of Diazepam as approved.

## 2014-06-24 ENCOUNTER — Ambulatory Visit: Payer: Medicare PPO | Admitting: Family Medicine

## 2014-07-11 ENCOUNTER — Other Ambulatory Visit: Payer: Self-pay | Admitting: Family Medicine

## 2014-07-15 LAB — HM DIABETES EYE EXAM

## 2014-07-31 ENCOUNTER — Ambulatory Visit: Payer: Medicare PPO | Admitting: Family Medicine

## 2014-08-04 ENCOUNTER — Ambulatory Visit: Payer: Medicare PPO | Admitting: Neurology

## 2014-08-18 ENCOUNTER — Ambulatory Visit (INDEPENDENT_AMBULATORY_CARE_PROVIDER_SITE_OTHER): Payer: Medicare PPO | Admitting: Neurology

## 2014-08-18 ENCOUNTER — Encounter: Payer: Self-pay | Admitting: Neurology

## 2014-08-18 VITALS — BP 140/82 | HR 76 | Resp 16 | Ht 70.0 in | Wt 204.8 lb

## 2014-08-18 DIAGNOSIS — R208 Other disturbances of skin sensation: Secondary | ICD-10-CM

## 2014-08-18 DIAGNOSIS — I10 Essential (primary) hypertension: Secondary | ICD-10-CM

## 2014-08-18 DIAGNOSIS — G459 Transient cerebral ischemic attack, unspecified: Secondary | ICD-10-CM

## 2014-08-18 DIAGNOSIS — R200 Anesthesia of skin: Secondary | ICD-10-CM | POA: Insufficient documentation

## 2014-08-18 NOTE — Progress Notes (Signed)
NEUROLOGY FOLLOW UP OFFICE NOTE  Todd Diaz [id number]  HISTORY OF PRESENT ILLNESS: Todd Diaz is a 74 year old right-handed man with hypertension, hyperlipidemia, type 2 diabetes and history of panic attacks who presents for TIA and intermittent right leg numbness    UPDATE: He is doing well.  He is taking Plavix for secondary stroke prevention and started on lovastatin.  To complete TIA workup, a 2D echo was recommended.  To assess intermittent right leg numbness, a NCV-EMG was recommended.  These tests were not performed because he had to miss appointments due to his wife being ill.  Thankfully, she is doing better.  His leg numbness is not too bad.  HISTORY: He presented to [facility] on 10/23/13 with sudden onset of right arm and leg numbness and weakness while watching TV.  He reportedly had an MRI of the brain and carotid dopplers.  Symptoms resolved by the next morning.  He was told "there was a spot on the brain".  Carotid doppler was okay.  He was already on ASA 84m daily which was unchanged.  I don't have any records from this visit.  [facility] and lower extremity weakness with paresthesias.  He was out of the window for tPA.  CT of head was reportedly negative.  Symptoms lasted about 2 hours.  His ASA was increased to 3268mand discharged from the ED with instructions for neurology follow-up.  He reportedly had another carotid doppler which was normal.  Lipid panel from 03/25/14 showed cholesterol 186, HDL 30 and LDL 93.  Hgb A1c was 6.4 with mean plasma glucose of 137.  He is scheduled for a 2D echo.  Beginning around January or February, he noticed numbness in the back of his right leg from the thigh down to the foot.  It only occurs when he is sitting in certain chairs or while driving.  There is no associated weakness, pain or back pain, although he has a history of back pain.  It resolves when he stands up and walks.  PAST MEDICAL  HISTORY: Past Medical History  Diagnosis Date  . Hypertension   . Diabetes mellitus   . Avascular necrosis of medial femoral condyle   . Hyperlipidemia   . Arthritis     Knees  . Erectile dysfunction   . Hypertrophy of prostate without urinary obstruction and other lower urinary tract symptoms (LUTS)   . Chronic prostatitis   . Hearing loss   . Infection of urinary tract     ASSESSMENT:Recurrent MRSA UTI F/B Dr. BlClayborn Bignessnd Dr. CoJacqlyn Diaz. Tobacco use disorder   . TIA (transient ischemic attack) 02/23/2014    R sided weakness and numbness; duration 1 hour; evaluated [facility].    MEDICATIONS: Current Outpatient Prescriptions on File Prior to Visit  Medication Sig Dispense Refill  . clopidogrel (PLAVIX) 75 MG tablet Take 1 tablet (75 mg total) by mouth daily. 30 tablet 6  . cromolyn (NASALCROM) 5.2 MG/ACT nasal spray 1 spray by Nasal route 4 (four) times daily.    . diazepam (VALIUM) 5 MG tablet Take 1/2 to 1 po daily as needed with onset of panic attack 20 tablet 0  . hydrochlorothiazide (HYDRODIURIL) 25 MG tablet Take 1 tablet (25 mg total) by mouth daily. 90 tablet 0  . lisinopril (PRINIVIL,ZESTRIL) 20 MG tablet Take 1 tablet (20 mg total) by mouth daily. 90 tablet 3  . lovastatin (MEVACOR) 20 MG tablet Take 1  tablet (20 mg total) by mouth at bedtime. 90 tablet 1  . metFORMIN (GLUCOPHAGE) 500 MG tablet Take by mouth.    . Alcohol Swabs (B-D SINGLE USE SWABS REGULAR) PADS Test blood sugar daily. Dx code: E11.9 100 each 3  . aspirin EC 81 MG tablet Take 81 mg by mouth daily.    Marland Kitchen aspirin EC 81 MG tablet Take by mouth.    . benzonatate (TESSALON) 100 MG capsule   0  . Blood Glucose Calibration (ACCU-CHEK COMPACT PLUS CONTROL) SOLN Test blood sugar daily. Dx code: E11.9 1 each 0  . Blood Glucose Monitoring Suppl (BLOOD GLUCOSE METER KIT AND SUPPLIES) KIT Test blood sugar daily. Dx code: E11.9 1 each 0  . glucose blood test strip Test blood sugar daily. Dx Code: E11.9 100 each 3  .  hydrochlorothiazide (HYDRODIURIL) 25 MG tablet Take by mouth.    . Lancets MISC Test blood sugar daily. Dx code: E11.9 100 each 3  . lisinopril (PRINIVIL,ZESTRIL) 10 MG tablet     . lisinopril (PRINIVIL,ZESTRIL) 20 MG tablet Take by mouth.    . lovastatin (MEVACOR) 20 MG tablet Take by mouth.    . lovastatin (MEVACOR) 20 MG tablet take 1 tablet by mouth at bedtime 90 tablet 0  . metFORMIN (GLUCOPHAGE) 500 MG tablet Take 1 tablet (500 mg total) by mouth 2 (two) times daily with a meal. 180 tablet 0  . sildenafil (VIAGRA) 100 MG tablet as directed.    Marland Kitchen VIAGRA 100 MG tablet take 1 tablet by mouth once daily if needed (Patient not taking: Reported on 05/05/2014) 8 tablet 0   No current facility-administered medications on file prior to visit.    ALLERGIES: No Known Allergies  FAMILY HISTORY: Family History  Problem Relation Age of Onset  . Hypertension Mother   . Cancer Mother     lung  . Heart attack Father   . Hypertension Father   . Hyperlipidemia Father   . Aneurysm Father 44    Brain  . Diabetes Son     SOCIAL HISTORY: History   Social History  . Marital Status: Widowed    Spouse Name: N/A  . Number of Children: 4  . Years of Education: 12   Occupational History  . 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 Smoking status: Former Smoker -- 0.25 packs/day for 20 years    Types: Cigarettes    Quit date: 01/09/1986  . Smokeless tobacco: Current User    Types: Chew     Comment: Quit in the 1990's uses chewing tobacco  . Alcohol Use: 0.0 oz/week    0 Standard drinks or equivalent per week     Comment: occasional 12 beers per weekend night.  DUI in 2000.  . Drug Use: No  . Sexual Activity:    Partners: Female    Museum/gallery curator: None   Other Topics Concern  . Not on file   Social History Narrative   Widowed since 02/2009 after 18 years second marriage; engaged in 01/2011.        Children: 6 children, 2  stepchildren; 8 grandchildren, 2 gg.      Lives: with fiance, cat, dog.      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      Tobacco: quit smoking age 12; smoked x 22 years.      Alcohol:  Beer every weekend (  12 pack-24 pack).      Seatbelt:  Always uses seat belts, Smoke alarm in the home, >5 guns in the home loaded and locked up.   Caffeine VBT:YOMAYO, Tea, Carbonated beverages; consumes a moderate amount.   Exercise: Inactive.   LIVING WILL; FULL CODE, No HCPOA.    REVIEW OF SYSTEMS: Constitutional: No fevers, chills, or sweats, no generalized fatigue, change in appetite Eyes: No visual changes, double vision, eye pain Ear, nose and throat: No hearing loss, ear pain, nasal congestion, sore throat Cardiovascular: No chest pain, palpitations Respiratory:  No shortness of breath at rest or with exertion, wheezes GastrointestinaI: No nausea, vomiting, diarrhea, abdominal pain, fecal incontinence Genitourinary:  No dysuria, urinary retention or frequency Musculoskeletal:  No neck pain, back pain Integumentary: No rash, pruritus, skin lesions Neurological: as above Psychiatric: No depression, insomnia, anxiety Endocrine: No palpitations, fatigue, diaphoresis, mood swings, change in appetite, change in weight, increased thirst Hematologic/Lymphatic:  No anemia, purpura, petechiae. Allergic/Immunologic: no itchy/runny eyes, nasal congestion, recent allergic reactions, rashes  PHYSICAL EXAM: Filed Vitals:   08/18/14 0905  BP: 140/82  Pulse: 76  Resp: 16   General: No acute distress.  Patient appears well-groomed.  normal body habitus. Head:  Normocephalic/atraumatic Eyes:  Fundoscopic exam unremarkable without vessel changes, exudates, hemorrhages or papilledema. Neck: supple, no paraspinal tenderness, full range of motion Heart:  Regular rate and rhythm Lungs:  Clear to auscultation bilaterally Back: No paraspinal tenderness Neurological Exam: alert and oriented  to person, place, and time. Attention span and concentration intact, recent and remote memory intact, fund of knowledge intact.  Speech fluent and not dysarthric, language intact.  CN II-XII intact. Fundoscopic exam unremarkable without vessel changes, exudates, hemorrhages or papilledema.  Bulk and tone normal, muscle strength 5/5 throughout.  Sensation to light touch, temperature and vibration intact.  Deep tendon reflexes 2+ throughout, toes downgoing.  Finger to nose and heel to shin testing intact.  Gait with mild stumble, Romberg negative.  IMPRESSION: TIA Intermittent right leg numbness, suspect possible mild right S1 radiculopathy.   Essential hypertension.  PLAN: 1.  Plavix 2.  Lovastatin (LDL goal less than 70) 3.  2D echo to complete TIA workup 4.  Monitor the leg numbness for now, since it doesn't really bother him. 5.  Blood pressure today mildly elevated.  Recommend recheck with PCP. 6.  Follow up in 6 months.  15 minutes spent face to face with patient, over 50% spent discussing management.  Metta Clines, DO  CC:  Reginia Forts, MD

## 2014-08-18 NOTE — Patient Instructions (Signed)
Continue Plavix and lovastatin Will get 2D echocardiogram  Monitor the leg numbness Follow up in 6 months.

## 2014-08-19 ENCOUNTER — Telehealth: Payer: Self-pay | Admitting: *Deleted

## 2014-08-19 NOTE — Addendum Note (Signed)
Addended by: Charyl Bigger E on: 08/19/2014 09:39 AM   Modules accepted: Orders

## 2014-08-19 NOTE — Telephone Encounter (Signed)
Patient is aware that he has appt at [facility] 08/20/14 at 9:45am if he needs to change the appt day or time number was given to call and reschedule [phone]

## 2014-08-20 ENCOUNTER — Encounter (INDEPENDENT_AMBULATORY_CARE_PROVIDER_SITE_OTHER): Payer: Medicare PPO | Admitting: Ophthalmology

## 2014-08-20 ENCOUNTER — Ambulatory Visit (HOSPITAL_COMMUNITY): Payer: Medicare PPO

## 2014-08-25 ENCOUNTER — Ambulatory Visit (HOSPITAL_COMMUNITY)
Admission: RE | Admit: 2014-08-25 | Discharge: 2014-08-25 | Disposition: A | Discharge status: Home / Self Care | Payer: Medicare PPO | Source: Ambulatory Visit | Attending: Neurology | Admitting: Neurology

## 2014-08-25 DIAGNOSIS — R208 Other disturbances of skin sensation: Secondary | ICD-10-CM

## 2014-08-25 DIAGNOSIS — G459 Transient cerebral ischemic attack, unspecified: Secondary | ICD-10-CM | POA: Diagnosis not present

## 2014-08-25 DIAGNOSIS — Z87891 Personal history of nicotine dependence: Secondary | ICD-10-CM | POA: Insufficient documentation

## 2014-08-25 DIAGNOSIS — I10 Essential (primary) hypertension: Secondary | ICD-10-CM

## 2014-08-25 DIAGNOSIS — I5189 Other ill-defined heart diseases: Secondary | ICD-10-CM | POA: Insufficient documentation

## 2014-08-25 DIAGNOSIS — I059 Rheumatic mitral valve disease, unspecified: Secondary | ICD-10-CM | POA: Diagnosis not present

## 2014-08-25 DIAGNOSIS — I071 Rheumatic tricuspid insufficiency: Secondary | ICD-10-CM | POA: Insufficient documentation

## 2014-08-25 DIAGNOSIS — E119 Type 2 diabetes mellitus without complications: Secondary | ICD-10-CM | POA: Insufficient documentation

## 2014-08-25 DIAGNOSIS — R200 Anesthesia of skin: Secondary | ICD-10-CM

## 2014-08-25 NOTE — Progress Notes (Signed)
Echocardiogram 2D Echocardiogram has been performed.  Tresa Res 08/25/2014, 1:49 PM

## 2014-08-27 ENCOUNTER — Telehealth: Payer: Self-pay | Admitting: *Deleted

## 2014-08-27 NOTE — Telephone Encounter (Signed)
Echo looks okay. It does not show anything that would change management in stroke prevention. Patient  Is aware .

## 2014-08-27 NOTE — Telephone Encounter (Signed)
-----   Message from Pieter Partridge, DO sent at 08/26/2014  7:11 AM EDT ----- Echo looks okay.  It does not show anything that would change management in stroke prevention.

## 2014-08-31 ENCOUNTER — Encounter (INDEPENDENT_AMBULATORY_CARE_PROVIDER_SITE_OTHER): Payer: Medicare PPO | Admitting: Ophthalmology

## 2014-08-31 DIAGNOSIS — H2511 Age-related nuclear cataract, right eye: Secondary | ICD-10-CM

## 2014-08-31 DIAGNOSIS — H33301 Unspecified retinal break, right eye: Secondary | ICD-10-CM | POA: Diagnosis not present

## 2014-08-31 DIAGNOSIS — H43813 Vitreous degeneration, bilateral: Secondary | ICD-10-CM | POA: Diagnosis not present

## 2014-08-31 DIAGNOSIS — E11319 Type 2 diabetes mellitus with unspecified diabetic retinopathy without macular edema: Secondary | ICD-10-CM

## 2014-08-31 DIAGNOSIS — I10 Essential (primary) hypertension: Secondary | ICD-10-CM

## 2014-08-31 DIAGNOSIS — H35033 Hypertensive retinopathy, bilateral: Secondary | ICD-10-CM

## 2014-08-31 DIAGNOSIS — E11329 Type 2 diabetes mellitus with mild nonproliferative diabetic retinopathy without macular edema: Secondary | ICD-10-CM

## 2014-09-21 ENCOUNTER — Other Ambulatory Visit: Payer: Self-pay

## 2014-09-21 DIAGNOSIS — E119 Type 2 diabetes mellitus without complications: Secondary | ICD-10-CM

## 2014-09-21 MED ORDER — METFORMIN HCL 500 MG PO TABS: 500.0000 mg | ORAL_TABLET | Freq: Two times a day (BID) | ORAL | Status: DC

## 2014-09-22 ENCOUNTER — Other Ambulatory Visit: Payer: Self-pay

## 2014-09-22 NOTE — Telephone Encounter (Signed)
[facility] mail order pharm reqs RF of diazepam 5 mg. Pended.

## 2014-10-02 ENCOUNTER — Encounter: Payer: Self-pay | Admitting: Family Medicine

## 2014-10-20 ENCOUNTER — Other Ambulatory Visit: Payer: Self-pay | Admitting: Family Medicine

## 2014-11-10 ENCOUNTER — Other Ambulatory Visit: Payer: Self-pay | Admitting: Family Medicine

## 2014-11-13 ENCOUNTER — Other Ambulatory Visit: Payer: Self-pay | Admitting: Family Medicine

## 2014-11-15 ENCOUNTER — Other Ambulatory Visit: Payer: Self-pay | Admitting: Family Medicine

## 2014-12-13 ENCOUNTER — Other Ambulatory Visit: Payer: Self-pay | Admitting: Family Medicine

## 2014-12-18 ENCOUNTER — Other Ambulatory Visit: Payer: Self-pay | Admitting: Family Medicine

## 2014-12-24 ENCOUNTER — Other Ambulatory Visit: Payer: Self-pay | Admitting: Family Medicine

## 2015-01-04 ENCOUNTER — Other Ambulatory Visit: Payer: Self-pay | Admitting: Family Medicine

## 2015-01-05 ENCOUNTER — Other Ambulatory Visit: Payer: Self-pay | Admitting: Neurology

## 2015-01-09 ENCOUNTER — Other Ambulatory Visit: Payer: Self-pay | Admitting: Family Medicine

## 2015-01-11 ENCOUNTER — Ambulatory Visit (INDEPENDENT_AMBULATORY_CARE_PROVIDER_SITE_OTHER): Payer: Medicare PPO

## 2015-01-11 ENCOUNTER — Ambulatory Visit (INDEPENDENT_AMBULATORY_CARE_PROVIDER_SITE_OTHER): Payer: Medicare PPO | Admitting: Family Medicine

## 2015-01-11 ENCOUNTER — Encounter: Payer: Self-pay | Admitting: Family Medicine

## 2015-01-11 VITALS — BP 128/76 | HR 73 | Temp 98.0°F | Resp 16 | Ht 70.0 in | Wt 208.2 lb

## 2015-01-11 DIAGNOSIS — M5416 Radiculopathy, lumbar region: Secondary | ICD-10-CM

## 2015-01-11 DIAGNOSIS — Z23 Encounter for immunization: Secondary | ICD-10-CM | POA: Diagnosis not present

## 2015-01-11 DIAGNOSIS — M5136 Other intervertebral disc degeneration, lumbar region: Secondary | ICD-10-CM | POA: Diagnosis not present

## 2015-01-11 DIAGNOSIS — Z8673 Personal history of transient ischemic attack (TIA), and cerebral infarction without residual deficits: Secondary | ICD-10-CM | POA: Diagnosis not present

## 2015-01-11 DIAGNOSIS — I10 Essential (primary) hypertension: Secondary | ICD-10-CM

## 2015-01-11 DIAGNOSIS — Z1211 Encounter for screening for malignant neoplasm of colon: Secondary | ICD-10-CM

## 2015-01-11 DIAGNOSIS — E119 Type 2 diabetes mellitus without complications: Secondary | ICD-10-CM | POA: Diagnosis not present

## 2015-01-11 DIAGNOSIS — E7800 Pure hypercholesterolemia, unspecified: Secondary | ICD-10-CM | POA: Diagnosis not present

## 2015-01-11 LAB — CBC WITH DIFFERENTIAL/PLATELET
Basophils Absolute: 0 10*3/uL (ref 0.0–0.1)
Basophils Relative: 0 % (ref 0–1)
Eosinophils Absolute: 0.2 10*3/uL (ref 0.0–0.7)
Eosinophils Relative: 3 % (ref 0–5)
HCT: 47.2 % (ref 39.0–52.0)
Hemoglobin: 15.9 g/dL (ref 13.0–17.0)
Lymphocytes Relative: 27 % (ref 12–46)
Lymphs Abs: 1.8 10*3/uL (ref 0.7–4.0)
MCH: 30.1 pg (ref 26.0–34.0)
MCHC: 33.7 g/dL (ref 30.0–36.0)
MCV: 89.4 fL (ref 78.0–100.0)
MPV: 9.8 fL (ref 8.6–12.4)
Monocytes Absolute: 0.5 10*3/uL (ref 0.1–1.0)
Monocytes Relative: 8 % (ref 3–12)
Neutro Abs: 4 10*3/uL (ref 1.7–7.7)
Neutrophils Relative %: 62 % (ref 43–77)
Platelets: 217 10*3/uL (ref 150–400)
RBC: 5.28 MIL/uL (ref 4.22–5.81)
RDW: 13.7 % (ref 11.5–15.5)
WBC: 6.5 10*3/uL (ref 4.0–10.5)

## 2015-01-11 LAB — POCT URINALYSIS DIP (MANUAL ENTRY)
Bilirubin, UA: NEGATIVE
Blood, UA: NEGATIVE
Glucose, UA: NEGATIVE
Ketones, POC UA: NEGATIVE
Leukocytes, UA: NEGATIVE
Nitrite, UA: NEGATIVE
Protein Ur, POC: NEGATIVE
Spec Grav, UA: 1.02
Urobilinogen, UA: 0.2
pH, UA: 6.5

## 2015-01-11 LAB — COMPREHENSIVE METABOLIC PANEL
ALT: 32 U/L (ref 9–46)
AST: 29 U/L (ref 10–35)
Albumin: 4.2 g/dL (ref 3.6–5.1)
Alkaline Phosphatase: 83 U/L (ref 40–115)
BUN: 10 mg/dL (ref 7–25)
CO2: 29 mmol/L (ref 20–31)
Calcium: 9.7 mg/dL (ref 8.6–10.3)
Chloride: 98 mmol/L (ref 98–110)
Creat: 0.78 mg/dL (ref 0.70–1.18)
Glucose, Bld: 107 mg/dL — ABNORMAL HIGH (ref 65–99)
Potassium: 4.3 mmol/L (ref 3.5–5.3)
Sodium: 139 mmol/L (ref 135–146)
Total Bilirubin: 0.4 mg/dL (ref 0.2–1.2)
Total Protein: 7.2 g/dL (ref 6.1–8.1)

## 2015-01-11 LAB — LIPID PANEL
Cholesterol: 153 mg/dL (ref 125–200)
HDL: 30 mg/dL — ABNORMAL LOW (ref >=40)
LDL Cholesterol: 78 mg/dL (ref <130)
Total CHOL/HDL Ratio: 5.1 Ratio — ABNORMAL HIGH (ref <=5.0)
Triglycerides: 226 mg/dL — ABNORMAL HIGH (ref <150)
VLDL: 45 mg/dL — ABNORMAL HIGH (ref <30)

## 2015-01-11 LAB — HEMOGLOBIN A1C
Hgb A1c MFr Bld: 6.3 % — ABNORMAL HIGH (ref <5.7)
Mean Plasma Glucose: 134 mg/dL — ABNORMAL HIGH (ref <117)

## 2015-01-11 MED ORDER — LOVASTATIN 20 MG PO TABS: ORAL_TABLET | ORAL | Status: DC

## 2015-01-11 MED ORDER — METFORMIN HCL 500 MG PO TABS: ORAL_TABLET | ORAL | Status: DC

## 2015-01-11 MED ORDER — HYDROCHLOROTHIAZIDE 25 MG PO TABS: ORAL_TABLET | ORAL | Status: DC

## 2015-01-11 MED ORDER — LISINOPRIL 20 MG PO TABS: 20.0000 mg | ORAL_TABLET | Freq: Every day | ORAL | Status: DC

## 2015-01-11 NOTE — Patient Instructions (Signed)
Sciatica With Rehab The sciatic nerve runs from the back down the leg and is responsible for sensation and control of the muscles in the back (posterior) side of the thigh, lower leg, and foot. Sciatica is a condition that is characterized by inflammation of this nerve.  SYMPTOMS   Signs of nerve damage, including numbness and/or weakness along the posterior side of the lower extremity.  Pain in the back of the thigh that may also travel down the leg.  Pain that worsens when sitting for long periods of time.  Occasionally, pain in the back or buttock. CAUSES  Inflammation of the sciatic nerve is the cause of sciatica. The inflammation is due to something irritating the nerve. Common sources of irritation include:  Sitting for long periods of time.  Direct trauma to the nerve.  Arthritis of the spine.  Herniated or ruptured disk.  Slipping of the vertebrae (spondylolisthesis).  Pressure from soft tissues, such as muscles or ligament-like tissue (fascia). RISK INCREASES WITH:  Sports that place pressure or stress on the spine (football or weightlifting).  Poor strength and flexibility.  Failure to warm up properly before activity.  Family history of low back pain or disk disorders.  Previous back injury or surgery.  Poor body mechanics, especially when lifting, or poor posture. PREVENTION   Warm up and stretch properly before activity.  Maintain physical fitness:  Strength, flexibility, and endurance.  Cardiovascular fitness.  Learn and use proper technique, especially with posture and lifting. When possible, have coach correct improper technique.  Avoid activities that place stress on the spine. PROGNOSIS If treated properly, then sciatica usually resolves within 6 weeks. However, occasionally surgery is necessary.  RELATED COMPLICATIONS   Permanent nerve damage, including pain, numbness, tingle, or weakness.  Chronic back pain.  Risks of surgery: infection,  bleeding, nerve damage, or damage to surrounding tissues. TREATMENT Treatment initially involves resting from any activities that aggravate your symptoms. The use of ice and medication may help reduce pain and inflammation. The use of strengthening and stretching exercises may help reduce pain with activity. These exercises may be performed at home or with referral to a therapist. A therapist may recommend further treatments, such as transcutaneous electronic nerve stimulation (TENS) or ultrasound. Your caregiver may recommend corticosteroid injections to help reduce inflammation of the sciatic nerve. If symptoms persist despite non-surgical (conservative) treatment, then surgery may be recommended. MEDICATION  If pain medication is necessary, then nonsteroidal anti-inflammatory medications, such as aspirin and ibuprofen, or other minor pain relievers, such as acetaminophen, are often recommended.  Do not take pain medication for 7 days before surgery.  Prescription pain relievers may be given if deemed necessary by your caregiver. Use only as directed and only as much as you need.  Ointments applied to the skin may be helpful.  Corticosteroid injections may be given by your caregiver. These injections should be reserved for the most serious cases, because they may only be given a certain number of times. HEAT AND COLD  Cold treatment (icing) relieves pain and reduces inflammation. Cold treatment should be applied for 10 to 15 minutes every 2 to 3 hours for inflammation and pain and immediately after any activity that aggravates your symptoms. Use ice packs or massage the area with a piece of ice (ice massage).  Heat treatment may be used prior to performing the stretching and strengthening activities prescribed by your caregiver, physical therapist, or athletic trainer. Use a heat pack or soak the injury in warm water.   SEEK MEDICAL CARE IF:  Treatment seems to offer no benefit, or the condition  worsens.  Any medications produce adverse side effects. EXERCISES  RANGE OF MOTION (ROM) AND STRETCHING EXERCISES - Sciatica Most people with sciatic will find that their symptoms worsen with either excessive bending forward (flexion) or arching at the low back (extension). The exercises which will help resolve your symptoms will focus on the opposite motion. Your physician, physical therapist or athletic trainer will help you determine which exercises will be most helpful to resolve your low back pain. Do not complete any exercises without first consulting with your clinician. Discontinue any exercises which worsen your symptoms until you speak to your clinician. If you have pain, numbness or tingling which travels down into your buttocks, leg or foot, the goal of the therapy is for these symptoms to move closer to your back and eventually resolve. Occasionally, these leg symptoms will get better, but your low back pain may worsen; this is typically an indication of progress in your rehabilitation. Be certain to be very alert to any changes in your symptoms and the activities in which you participated in the 24 hours prior to the change. Sharing this information with your clinician will allow him/her to most efficiently treat your condition. These exercises may help you when beginning to rehabilitate your injury. Your symptoms may resolve with or without further involvement from your physician, physical therapist or athletic trainer. While completing these exercises, remember:   Restoring tissue flexibility helps normal motion to return to the joints. This allows healthier, less painful movement and activity.  An effective stretch should be held for at least 30 seconds.  A stretch should never be painful. You should only feel a gentle lengthening or release in the stretched tissue. FLEXION RANGE OF MOTION AND STRETCHING EXERCISES: STRETCH - Flexion, Single Knee to Chest   Lie on a firm bed or floor  with both legs extended in front of you.  Keeping one leg in contact with the floor, bring your opposite knee to your chest. Hold your leg in place by either grabbing behind your thigh or at your knee.  Pull until you feel a gentle stretch in your low back. Hold __________ seconds.  Slowly release your grasp and repeat the exercise with the opposite side. Repeat __________ times. Complete this exercise __________ times per day.  STRETCH - Flexion, Double Knee to Chest  Lie on a firm bed or floor with both legs extended in front of you.  Keeping one leg in contact with the floor, bring your opposite knee to your chest.  Tense your stomach muscles to support your back and then lift your other knee to your chest. Hold your legs in place by either grabbing behind your thighs or at your knees.  Pull both knees toward your chest until you feel a gentle stretch in your low back. Hold __________ seconds.  Tense your stomach muscles and slowly return one leg at a time to the floor. Repeat __________ times. Complete this exercise __________ times per day.  STRETCH - Low Trunk Rotation   Lie on a firm bed or floor. Keeping your legs in front of you, bend your knees so they are both pointed toward the ceiling and your feet are flat on the floor.  Extend your arms out to the side. This will stabilize your upper body by keeping your shoulders in contact with the floor.  Gently and slowly drop both knees together to one side until   you feel a gentle stretch in your low back. Hold for __________ seconds.  Tense your stomach muscles to support your low back as you bring your knees back to the starting position. Repeat the exercise to the other side. Repeat __________ times. Complete this exercise __________ times per day  EXTENSION RANGE OF MOTION AND FLEXIBILITY EXERCISES: STRETCH - Extension, Prone on Elbows  Lie on your stomach on the floor, a bed will be too soft. Place your palms about shoulder  width apart and at the height of your head.  Place your elbows under your shoulders. If this is too painful, stack pillows under your chest.  Allow your body to relax so that your hips drop lower and make contact more completely with the floor.  Hold this position for __________ seconds.  Slowly return to lying flat on the floor. Repeat __________ times. Complete this exercise __________ times per day.  RANGE OF MOTION - Extension, Prone Press Ups  Lie on your stomach on the floor, a bed will be too soft. Place your palms about shoulder width apart and at the height of your head.  Keeping your back as relaxed as possible, slowly straighten your elbows while keeping your hips on the floor. You may adjust the placement of your hands to maximize your comfort. As you gain motion, your hands will come more underneath your shoulders.  Hold this position __________ seconds.  Slowly return to lying flat on the floor. Repeat __________ times. Complete this exercise __________ times per day.  STRENGTHENING EXERCISES - Sciatica  These exercises may help you when beginning to rehabilitate your injury. These exercises should be done near your "sweet spot." This is the neutral, low-back arch, somewhere between fully rounded and fully arched, that is your least painful position. When performed in this safe range of motion, these exercises can be used for people who have either a flexion or extension based injury. These exercises may resolve your symptoms with or without further involvement from your physician, physical therapist or athletic trainer. While completing these exercises, remember:   Muscles can gain both the endurance and the strength needed for everyday activities through controlled exercises.  Complete these exercises as instructed by your physician, physical therapist or athletic trainer. Progress with the resistance and repetition exercises only as your caregiver advises.  You may  experience muscle soreness or fatigue, but the pain or discomfort you are trying to eliminate should never worsen during these exercises. If this pain does worsen, stop and make certain you are following the directions exactly. If the pain is still present after adjustments, discontinue the exercise until you can discuss the trouble with your clinician. STRENGTHENING - Deep Abdominals, Pelvic Tilt   Lie on a firm bed or floor. Keeping your legs in front of you, bend your knees so they are both pointed toward the ceiling and your feet are flat on the floor.  Tense your lower abdominal muscles to press your low back into the floor. This motion will rotate your pelvis so that your tail bone is scooping upwards rather than pointing at your feet or into the floor.  With a gentle tension and even breathing, hold this position for __________ seconds. Repeat __________ times. Complete this exercise __________ times per day.  STRENGTHENING - Abdominals, Crunches   Lie on a firm bed or floor. Keeping your legs in front of you, bend your knees so they are both pointed toward the ceiling and your feet are flat on the   floor. Cross your arms over your chest.  Slightly tip your chin down without bending your neck.  Tense your abdominals and slowly lift your trunk high enough to just clear your shoulder blades. Lifting higher can put excessive stress on the low back and does not further strengthen your abdominal muscles.  Control your return to the starting position. Repeat __________ times. Complete this exercise __________ times per day.  STRENGTHENING - Quadruped, Opposite UE/LE Lift  Assume a hands and knees position on a firm surface. Keep your hands under your shoulders and your knees under your hips. You may place padding under your knees for comfort.  Find your neutral spine and gently tense your abdominal muscles so that you can maintain this position. Your shoulders and hips should form a rectangle  that is parallel with the floor and is not twisted.  Keeping your trunk steady, lift your right hand no higher than your shoulder and then your left leg no higher than your hip. Make sure you are not holding your breath. Hold this position __________ seconds.  Continuing to keep your abdominal muscles tense and your back steady, slowly return to your starting position. Repeat with the opposite arm and leg. Repeat __________ times. Complete this exercise __________ times per day.  STRENGTHENING - Abdominals and Quadriceps, Straight Leg Raise   Lie on a firm bed or floor with both legs extended in front of you.  Keeping one leg in contact with the floor, bend the other knee so that your foot can rest flat on the floor.  Find your neutral spine, and tense your abdominal muscles to maintain your spinal position throughout the exercise.  Slowly lift your straight leg off the floor about 6 inches for a count of 15, making sure to not hold your breath.  Still keeping your neutral spine, slowly lower your leg all the way to the floor. Repeat this exercise with each leg __________ times. Complete this exercise __________ times per day. POSTURE AND BODY MECHANICS CONSIDERATIONS - Sciatica Keeping correct posture when sitting, standing or completing your activities will reduce the stress put on different body tissues, allowing injured tissues a chance to heal and limiting painful experiences. The following are general guidelines for improved posture. Your physician or physical therapist will provide you with any instructions specific to your needs. While reading these guidelines, remember:  The exercises prescribed by your provider will help you have the flexibility and strength to maintain correct postures.  The correct posture provides the optimal environment for your joints to work. All of your joints have less wear and tear when properly supported by a spine with good posture. This means you will  experience a healthier, less painful body.  Correct posture must be practiced with all of your activities, especially prolonged sitting and standing. Correct posture is as important when doing repetitive low-stress activities (typing) as it is when doing a single heavy-load activity (lifting). RESTING POSITIONS Consider which positions are most painful for you when choosing a resting position. If you have pain with flexion-based activities (sitting, bending, stooping, squatting), choose a position that allows you to rest in a less flexed posture. You would want to avoid curling into a fetal position on your side. If your pain worsens with extension-based activities (prolonged standing, working overhead), avoid resting in an extended position such as sleeping on your stomach. Most people will find more comfort when they rest with their spine in a more neutral position, neither too rounded nor too   arched. Lying on a non-sagging bed on your side with a pillow between your knees, or on your back with a pillow under your knees will often provide some relief. Keep in mind, being in any one position for a prolonged period of time, no matter how correct your posture, can still lead to stiffness. PROPER SITTING POSTURE In order to minimize stress and discomfort on your spine, you must sit with correct posture Sitting with good posture should be effortless for a healthy body. Returning to good posture is a gradual process. Many people can work toward this most comfortably by using various supports until they have the flexibility and strength to maintain this posture on their own. When sitting with proper posture, your ears will fall over your shoulders and your shoulders will fall over your hips. You should use the back of the chair to support your upper back. Your low back will be in a neutral position, just slightly arched. You may place a small pillow or folded towel at the base of your low back for support.  When  working at a desk, create an environment that supports good, upright posture. Without extra support, muscles fatigue and lead to excessive strain on joints and other tissues. Keep these recommendations in mind: CHAIR:   A chair should be able to slide under your desk when your back makes contact with the back of the chair. This allows you to work closely.  The chair's height should allow your eyes to be level with the upper part of your monitor and your hands to be slightly lower than your elbows. BODY POSITION  Your feet should make contact with the floor. If this is not possible, use a foot rest.  Keep your ears over your shoulders. This will reduce stress on your neck and low back. INCORRECT SITTING POSTURES   If you are feeling tired and unable to assume a healthy sitting posture, do not slouch or slump. This puts excessive strain on your back tissues, causing more damage and pain. Healthier options include:  Using more support, like a lumbar pillow.  Switching tasks to something that requires you to be upright or walking.  Talking a brief walk.  Lying down to rest in a neutral-spine position. PROLONGED STANDING WHILE SLIGHTLY LEANING FORWARD  When completing a task that requires you to lean forward while standing in one place for a long time, place either foot up on a stationary 2-4 inch high object to help maintain the best posture. When both feet are on the ground, the low back tends to lose its slight inward curve. If this curve flattens (or becomes too large), then the back and your other joints will experience too much stress, fatigue more quickly and can cause pain.  CORRECT STANDING POSTURES Proper standing posture should be assumed with all daily activities, even if they only take a few moments, like when brushing your teeth. As in sitting, your ears should fall over your shoulders and your shoulders should fall over your hips. You should keep a slight tension in your abdominal  muscles to brace your spine. Your tailbone should point down to the ground, not behind your body, resulting in an over-extended swayback posture.  INCORRECT STANDING POSTURES  Common incorrect standing postures include a forward head, locked knees and/or an excessive swayback. WALKING Walk with an upright posture. Your ears, shoulders and hips should all line-up. PROLONGED ACTIVITY IN A FLEXED POSITION When completing a task that requires you to bend forward   at your waist or lean over a low surface, try to find a way to stabilize 3 of 4 of your limbs. You can place a hand or elbow on your thigh or rest a knee on the surface you are reaching across. This will provide you more stability so that your muscles do not fatigue as quickly. By keeping your knees relaxed, or slightly bent, you will also reduce stress across your low back. CORRECT LIFTING TECHNIQUES DO :   Assume a wide stance. This will provide you more stability and the opportunity to get as close as possible to the object which you are lifting.  Tense your abdominals to brace your spine; then bend at the knees and hips. Keeping your back locked in a neutral-spine position, lift using your leg muscles. Lift with your legs, keeping your back straight.  Test the weight of unknown objects before attempting to lift them.  Try to keep your elbows locked down at your sides in order get the best strength from your shoulders when carrying an object.  Always ask for help when lifting heavy or awkward objects. INCORRECT LIFTING TECHNIQUES DO NOT:   Lock your knees when lifting, even if it is a small object.  Bend and twist. Pivot at your feet or move your feet when needing to change directions.  Assume that you cannot safely pick up a paperclip without proper posture.   This information is not intended to replace advice given to you by your health care provider. Make sure you discuss any questions you have with your health care provider.     Document Released: 12/26/2004 Document Revised: 05/12/2014 Document Reviewed: 04/09/2008 [employer] Patient Education 2016 [employer]  

## 2015-01-11 NOTE — Progress Notes (Signed)
Subjective:    Patient ID: Todd Diaz, male    DOB: 07-Jan-1941, 75 y.o.   MRN: [id number]  01/11/2015  Hypothyroidism; Diabetes; and Medication Refill   HPI This 75 y.o. male presents for six month follow-up:   1. DMII: Patient reports good compliance with medication, good tolerance to medication, and good symptom control.  Checking sugar; running 108.  Metformin 518m bid. S/p B cataract surgery by Dr. HPandora Leiter S/p retina lasering retinal tear by MZigmund Daniel  2. HTN: Patient reports good compliance with medication, good tolerance to medication, and good symptom control.  Took BP Saturday 108/70.      3. Hyperlipidemia: Patient reports good compliance with medication, good tolerance to medication, and good symptom control.    4. History of TIA: s/p neurology consultation; s/p 2D-echo WNL. Started on Plavix daily.  Neurologist questioned if even suffered with TIA; feels that R leg numbness is likely due to nerve inflammation coming form back.  Suffers with intermittent lower back pain.  Numbness in leg is intermittent and is triggered when sitting on a hard stool or chair.   5.  Stress reaction: girlfriend just received cardiac pump; not a candidate for heart transplant due to several antibodies.  Girlfriend has been hospitalized most of 2016. Pt has not had time to attend regular follow-up appointments.  6. Colon cancer screening: refuses colonoscopy; agreeable to hemosure home kit.   Review of Systems  Constitutional: Negative for fever, chills, diaphoresis, activity change, appetite change and fatigue.  Respiratory: Negative for cough and shortness of breath.   Cardiovascular: Negative for chest pain, palpitations and leg swelling.  Gastrointestinal: Negative for nausea, vomiting, abdominal pain, diarrhea, constipation, blood in stool, anal bleeding and rectal pain.  Endocrine: Negative for cold intolerance, heat intolerance, polydipsia, polyphagia and polyuria.  Musculoskeletal:  Positive for back pain.  Skin: Negative for color change, rash and wound.  Neurological: Positive for numbness. Negative for dizziness, tremors, seizures, syncope, facial asymmetry, speech difficulty, weakness, light-headedness and headaches.  Psychiatric/Behavioral: Negative for sleep disturbance and dysphoric mood. The patient is not nervous/anxious.     Past Medical History  Diagnosis Date  . Hypertension   . Diabetes mellitus ([facility]   . Avascular necrosis of medial femoral condyle ([place]   . Hyperlipidemia   . Arthritis     Knees  . Erectile dysfunction   . Hypertrophy of prostate without urinary obstruction and other lower urinary tract symptoms (LUTS)   . Chronic prostatitis   . Hearing loss   . Infection of urinary tract     ASSESSMENT:Recurrent MRSA UTI F/B Dr. BClayborn Bignessand Dr. CJacqlyn Larsen . Tobacco use disorder   . TIA (transient ischemic attack) 02/23/2014    R sided weakness and numbness; duration 1 hour; evaluated [facility].   Past Surgical History  Procedure Laterality Date  . Prostate surgery    . Bladder stone removal    . Partial knee arthroplasty  07/24/2011    Procedure: UNICOMPARTMENTAL KNEE;  Surgeon: RLorn Junes MD;  [place]  Service: Orthopedics;  Laterality: Left;  . Amputation  2009    L fifth finger surgery for near amputation. [place].  . Admission  03/09/2012    [facility]; 24 hour admission; LE numbness/tingling.  CT head, MRI brain, carotid dopplers, 2D-echo.  . Retinal tear repair cryotherapy  08/09/2013    MZigmund Daniel . Hernia repair  04/09/2013    3 repaired; Gross.  . Eye surgery  08/09/2013    B cataract surgery;  [place]   No Known Allergies Current Outpatient Prescriptions  Medication Sig Dispense Refill  . aspirin 325 MG tablet Take 325 mg by mouth daily.    . Blood Glucose Calibration (ACCU-CHEK COMPACT PLUS CONTROL) SOLN Test blood sugar daily. Dx code: E11.9 1 each 0  . Blood Glucose Monitoring Suppl (BLOOD GLUCOSE METER KIT AND SUPPLIES) KIT Test  blood sugar daily. Dx code: E11.9 1 each 0  . clopidogrel (PLAVIX) 75 MG tablet take 1 tablet by mouth once daily 30 tablet 1  . diazepam (VALIUM) 5 MG tablet Take 1/2 to 1 po daily as needed with onset of panic attack 20 tablet 0  . glucose blood test strip Test blood sugar daily. Dx Code: E11.9 100 each 3  . hydrochlorothiazide (HYDRODIURIL) 25 MG tablet TAKE 1 TABLET BY MOUTH ONCE DAILY 90 tablet 1  . Lancets MISC Test blood sugar daily. Dx code: E11.9 100 each 3  . lisinopril (PRINIVIL,ZESTRIL) 20 MG tablet Take 1 tablet (20 mg total) by mouth daily. 90 tablet 1  . lovastatin (MEVACOR) 20 MG tablet TAKE 1 TABLET BY MOUTH AT BEDTIME 90 tablet 1  . metFORMIN (GLUCOPHAGE) 500 MG tablet TAKE 1 TABLET BY MOUTH TWICE A DAY WITH MEAL 180 tablet 1  . sildenafil (VIAGRA) 100 MG tablet as directed.    Marland Kitchen VIAGRA 100 MG tablet take 1 tablet by mouth once daily if needed 8 tablet 0  . Alcohol Swabs (B-D SINGLE USE SWABS REGULAR) PADS Test blood sugar daily. Dx code: E11.9 (Patient not taking: Reported on 01/11/2015) 100 each 3   No current facility-administered medications for this visit.   Social History   Social History  . Marital Status: Widowed    Spouse Name: N/A  . Number of Children: 4  . Years of Education: 12   Occupational History  . 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 Smoking status: Former Smoker -- 0.25 packs/day for 20 years    Types: Cigarettes    Quit date: 01/09/1986  . Smokeless tobacco: Current User    Types: Chew     Comment: Quit in the 1990's uses chewing tobacco  . Alcohol Use: 0.0 oz/week    0 Standard drinks or equivalent per week     Comment: occasional 12 beers per weekend night.  DUI in 2000.  . Drug Use: No  . Sexual Activity:    Partners: Female    Museum/gallery curator: None   Other Topics Concern  . Not on file   Social History Narrative   Widowed since 02/2009 after 4 years second marriage; engaged  in 01/2011.        Children: 6 children, 2 stepchildren; 8 grandchildren, 2 gg.      Lives: with fiance, cat, dog.      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      Tobacco: quit smoking age 56; smoked x 22 years.      Alcohol:  Beer every weekend (12 pack-24 pack).      Seatbelt:  Always uses seat belts, Smoke alarm in the home, >5 guns in the home loaded and locked up.   Caffeine IRC:VELFYB, Tea, Carbonated beverages; consumes a moderate amount.   Exercise: Inactive.   LIVING WILL; FULL CODE, No HCPOA.   Family History  Problem Relation Age of Onset  . Hypertension Mother   . Cancer Mother  lung  . Heart attack Father   . Hypertension Father   . Hyperlipidemia Father   . Aneurysm Father 71    Brain  . Diabetes Son        Objective:    BP 128/76 mmHg  Pulse 73  Temp(Src) 98 F (36.7 C) (Oral)  Resp 16  Ht '5\' 10"'  (1.778 m)  Wt 208 lb 3.2 oz (94.439 kg)  BMI 29.87 kg/m2  SpO2 96% Physical Exam  Constitutional: He is oriented to person, place, and time. He appears well-developed and well-nourished. No distress.  HENT:  Head: Normocephalic and atraumatic.  Right Ear: External ear normal.  Left Ear: External ear normal.  Nose: Nose normal.  Mouth/Throat: Oropharynx is clear and moist.  Eyes: Conjunctivae and EOM are normal. Pupils are equal, round, and reactive to light.  Neck: Normal range of motion. Neck supple. Carotid bruit is not present. No thyromegaly present.  Cardiovascular: Normal rate, regular rhythm, normal heart sounds and intact distal pulses.  Exam reveals no gallop and no friction rub.   No murmur heard. Pulmonary/Chest: Effort normal and breath sounds normal. He has no wheezes. He has no rales.  Abdominal: Soft. Bowel sounds are normal. He exhibits no distension and no mass. There is no tenderness. There is no rebound and no guarding.  Musculoskeletal:       Lumbar back: Normal. He exhibits normal range of motion, no tenderness,  no bony tenderness, no swelling and no spasm.  Lumbar spine:  Non-tender midline; non-tender paraspinal regions B.  Straight leg raises negative B; toe and heel walking intact; marching intact; motor 5/5 BLE.  Full ROM lumbar spine without limitation.   Lymphadenopathy:    He has no cervical adenopathy.  Neurological: He is alert and oriented to person, place, and time. No cranial nerve deficit.  Skin: Skin is warm and dry. No rash noted. He is not diaphoretic.  Psychiatric: He has a normal mood and affect. His behavior is normal.  Nursing note and vitals reviewed.  Results for orders placed or performed in visit on 10/28/14  HM DIABETES EYE EXAM  Result Value Ref Range   HM Diabetic Eye Exam  No Retinopathy   [facility] reading (PRIMARY) by  Dr. Tamala Julian. LUMBAR SPINE FILMS: DIFFUSE SPURRING AND DEGENERATIVE [place] DISEASE.      Assessment & Plan:   1. Pure hypercholesterolemia   2. Essential hypertension, benign   3. Type 2 diabetes mellitus without complication, without long-term current use of insulin ([place])   4. History of TIA (transient ischemic attack)   5. Lumbar radiculopathy   6. Need for prophylactic vaccination and inoculation against influenza   7. Colon cancer screening     1. Hypercholesterolemia: controlled; obtain labs; refill provided. 2.  HTN: controlled; obtain labs; refills provided. 3.  DMII: controlled; obtain labs; refill provided. 4.  History of TIA: stable; s/p neurology consultation; started on Plavix therapy. 5.  Lumbar radiculopathy/DDD lumbar New and intermittent; with moderate DDD lumbar; if worsens, refer to ortho. 6.  Colon cancer screening: pt refuses colonoscopy; hemosure home kit provided for completion. Asymptomatic. 7. S/p flu vaccine.   Orders Placed This Encounter  Procedures  . DG Lumbar Spine Complete    Standing Status: Future     Number of Occurrences:      Standing Expiration Date: 01/11/2016    Order Specific Question:  Reason for Exam  (SYMPTOM  OR DIAGNOSIS REQUIRED)    Answer:  R leg radicular symptoms  Order Specific Question:  Preferred imaging location?    Answer:  External  . Flu Vaccine QUAD 36+ mos IM  . CBC with Differential/Platelet  . Comprehensive metabolic panel    Order Specific Question:  Has the patient fasted?    Answer:  Yes  . Hemoglobin A1c  . Lipid panel    Order Specific Question:  Has the patient fasted?    Answer:  Yes  . Microalbumin, urine  . POCT urinalysis dipstick  . IFOBT POC (occult bld, rslt in office)   Meds ordered this encounter  Medications  . aspirin 325 MG tablet    Sig: Take 325 mg by mouth daily.  . hydrochlorothiazide (HYDRODIURIL) 25 MG tablet    Sig: TAKE 1 TABLET BY MOUTH ONCE DAILY    Dispense:  90 tablet    Refill:  1  . lovastatin (MEVACOR) 20 MG tablet    Sig: TAKE 1 TABLET BY MOUTH AT BEDTIME    Dispense:  90 tablet    Refill:  1  . lisinopril (PRINIVIL,ZESTRIL) 20 MG tablet    Sig: Take 1 tablet (20 mg total) by mouth daily.    Dispense:  90 tablet    Refill:  1  . metFORMIN (GLUCOPHAGE) 500 MG tablet    Sig: TAKE 1 TABLET BY MOUTH TWICE A DAY WITH MEAL    Dispense:  180 tablet    Refill:  1    Return in about 4 months (around 05/11/2015) for complete physical examiniation.    Osinachi Navarrette Elayne Guerin, M.D. [facility] [address] [place], [place]  [address] [phone] phone [phone] fax

## 2015-01-12 DIAGNOSIS — E119 Type 2 diabetes mellitus without complications: Secondary | ICD-10-CM | POA: Insufficient documentation

## 2015-01-12 DIAGNOSIS — M5136 Other intervertebral disc degeneration, lumbar region: Secondary | ICD-10-CM | POA: Insufficient documentation

## 2015-01-12 DIAGNOSIS — Z8673 Personal history of transient ischemic attack (TIA), and cerebral infarction without residual deficits: Secondary | ICD-10-CM | POA: Insufficient documentation

## 2015-01-12 LAB — MICROALBUMIN, URINE: Microalb, Ur: 1.2 mg/dL

## 2015-01-20 ENCOUNTER — Encounter: Payer: Self-pay | Admitting: Family Medicine

## 2015-02-19 ENCOUNTER — Encounter: Payer: Self-pay | Admitting: Neurology

## 2015-02-19 ENCOUNTER — Ambulatory Visit (INDEPENDENT_AMBULATORY_CARE_PROVIDER_SITE_OTHER): Payer: Medicare PPO | Admitting: Neurology

## 2015-02-19 VITALS — BP 138/84 | HR 69 | Ht 70.0 in | Wt 210.0 lb

## 2015-02-19 DIAGNOSIS — I10 Essential (primary) hypertension: Secondary | ICD-10-CM

## 2015-02-19 DIAGNOSIS — E785 Hyperlipidemia, unspecified: Secondary | ICD-10-CM

## 2015-02-19 DIAGNOSIS — E118 Type 2 diabetes mellitus with unspecified complications: Secondary | ICD-10-CM | POA: Diagnosis not present

## 2015-02-19 DIAGNOSIS — G459 Transient cerebral ischemic attack, unspecified: Secondary | ICD-10-CM

## 2015-02-19 DIAGNOSIS — G5701 Lesion of sciatic nerve, right lower limb: Secondary | ICD-10-CM

## 2015-02-19 MED ORDER — LOVASTATIN 10 MG PO TABS: 30.0000 mg | ORAL_TABLET | Freq: Every day | ORAL | Status: DC

## 2015-02-19 NOTE — Patient Instructions (Signed)
1.  Continue Plavix 2.  Increase lovastatin to 30mg  daily (prescribed you 10mg  tablets.  Take 3 of them daily) 3.  Physical therapy for sciatica. 4.  Follow up in 4 to 5 months (recheck fasting lipid panel prior to follow up)

## 2015-02-19 NOTE — Progress Notes (Signed)
Chart forwarded.  

## 2015-02-19 NOTE — Progress Notes (Signed)
NEUROLOGY FOLLOW UP OFFICE NOTE  Todd Diaz [id number]  HISTORY OF PRESENT ILLNESS: Todd Diaz is a 75 year old right-handed man with hypertension, hyperlipidemia, type 2 diabetes and history of panic attacks who follows up for TIA and right sciatica/radiculopathy.  Labs and echo report reviewed.  Image of lumbar Xray reviewed.  UPDATE: He is on Plavix and lovastatin.  2D echo from 08/25/14 showed LV EF 55-60%.  Labs from 01/11/15 include Hgb A1c 6.3% and LDL 78.  Over the past year, he continues to have right lower leg pain, posterior, shooting, associated with numbness.  It is noticeable when he is sitting.  His back does not feel too bad.  Lumbar Xray from January revealed degenerative disc disease at L4-5 and L5-S1.  HISTORY: He presented to [facility] on 10/23/13 with sudden onset of right arm and leg numbness and weakness while watching TV.  He reportedly had an MRI of the brain and carotid dopplers.  Symptoms resolved by the next morning.  He was told "there was a spot on the brain".  Carotid doppler was okay.  He was already on ASA 62m daily which was unchanged.  I don't have any records from this visit.  [facility] lower [facility] weakness with paresthesias.  He was out of the window for tPA.  CT of head was reportedly negative.  Symptoms lasted about 2 hours.  His ASA was increased to 3251mand discharged from the ED with instructions for neurology follow-up.  He reportedly had another carotid doppler which was normal.  Lipid panel from 03/25/14 showed cholesterol 186, HDL 30 and LDL 93.  Hgb A1c was 6.4 with mean plasma glucose of 137.  He is scheduled for a 2D echo.  Beginning a year ago, he noticed numbness in the back of his right leg from the thigh down to the foot.  It only occurs when he is sitting in certain chairs or while driving.  There is no associated weakness, pain or back pain, although he has a history of back pain.  It  resolves when he stands up and walks.  PAST MEDICAL HISTORY: Past Medical History  Diagnosis Date  . Hypertension   . Diabetes mellitus ([place]  . Avascular necrosis of medial femoral condyle (HCC[place]  . Hyperlipidemia   . Arthritis     Knees  . Erectile dysfunction   . Hypertrophy of prostate without urinary obstruction and other lower urinary tract symptoms (LUTS)   . Chronic prostatitis   . Hearing loss   . Infection of urinary tract     ASSESSMENT:Recurrent MRSA UTI F/B Dr. BlClayborn Bignessnd Dr. CoJacqlyn Larsen. Tobacco use disorder   . TIA (transient ischemic attack) 02/23/2014    R sided weakness and numbness; duration 1 hour; evaluated [facility].    MEDICATIONS: Current Outpatient Prescriptions on File Prior to Visit  Medication Sig Dispense Refill  . aspirin 325 MG tablet Take 325 mg by mouth daily.    . Blood Glucose Calibration (ACCU-CHEK COMPACT PLUS CONTROL) SOLN Test blood sugar daily. Dx code: E11.9 1 each 0  . Blood Glucose Monitoring Suppl (BLOOD GLUCOSE METER KIT AND SUPPLIES) KIT Test blood sugar daily. Dx code: E11.9 1 each 0  . clopidogrel (PLAVIX) 75 MG tablet take 1 tablet by mouth once daily 30 tablet 1  . diazepam (VALIUM) 5 MG tablet Take 1/2 to 1 po daily as needed with onset of panic attack 20  tablet 0  . glucose blood test strip Test blood sugar daily. Dx Code: E11.9 100 each 3  . hydrochlorothiazide (HYDRODIURIL) 25 MG tablet TAKE 1 TABLET BY MOUTH ONCE DAILY 90 tablet 1  . Lancets MISC Test blood sugar daily. Dx code: E11.9 100 each 3  . lisinopril (PRINIVIL,ZESTRIL) 20 MG tablet Take 1 tablet (20 mg total) by mouth daily. 90 tablet 1  . metFORMIN (GLUCOPHAGE) 500 MG tablet TAKE 1 TABLET BY MOUTH TWICE A DAY WITH MEAL 180 tablet 1  . sildenafil (VIAGRA) 100 MG tablet as directed.    Marland Kitchen VIAGRA 100 MG tablet take 1 tablet by mouth once daily if needed 8 tablet 0   No current facility-administered medications on file prior to visit.    ALLERGIES: No Known  Allergies  FAMILY HISTORY: Family History  Problem Relation Age of Onset  . Hypertension Mother   . Cancer Mother     lung  . Heart attack Father   . Hypertension Father   . Hyperlipidemia Father   . Aneurysm Father 61    Brain  . Diabetes Son     SOCIAL HISTORY: Social History   Social History  . Marital Status: Widowed    Spouse Name: N/A  . Number of Children: 4  . Years of Education: 12   Occupational History  . 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 Smoking status: Former Smoker -- 0.25 packs/day for 20 years    Types: Cigarettes    Quit date: 01/09/1986  . Smokeless tobacco: Current User    Types: Chew     Comment: Quit in the 1990's uses chewing tobacco  . Alcohol Use: 0.0 oz/week    0 Standard drinks or equivalent per week     Comment: occasional 12 beers per weekend night.  DUI in 2000.  . Drug Use: No  . Sexual Activity:    Partners: Female    Museum/gallery curator: None   Other Topics Concern  . Not on file   Social History Narrative   Widowed since 02/2009 after 79 years second marriage; engaged in 01/2011.        Children: 6 children, 2 stepchildren; 8 grandchildren, 2 gg.      Lives: with fiance, cat, dog.      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      Tobacco: quit smoking age 32; smoked x 22 years.      Alcohol:  Beer every weekend (12 pack-24 pack).      Seatbelt:  Always uses seat belts, Smoke alarm in the home, >5 guns in the home loaded and locked up.   Caffeine HFW:YOVZCH, Tea, Carbonated beverages; consumes a moderate amount.   Exercise: Inactive.   LIVING WILL; FULL CODE, No HCPOA.    REVIEW OF SYSTEMS: Constitutional: No fevers, chills, or sweats, no generalized fatigue, change in appetite Eyes: No visual changes, double vision, eye pain Ear, nose and throat: No hearing loss, ear pain, nasal congestion, sore throat Cardiovascular: No chest pain,  palpitations Respiratory:  No shortness of breath at rest or with exertion, wheezes GastrointestinaI: No nausea, vomiting, diarrhea, abdominal pain, fecal incontinence Genitourinary:  No dysuria, urinary retention or frequency Musculoskeletal:  No neck pain, back pain Integumentary: No rash, pruritus, skin lesions Neurological: as above Psychiatric: No depression, insomnia, anxiety Endocrine: No palpitations, fatigue, diaphoresis, mood swings, change in appetite, change in weight,  increased thirst Hematologic/Lymphatic:  No anemia, purpura, petechiae. Allergic/Immunologic: no itchy/runny eyes, nasal congestion, recent allergic reactions, rashes  PHYSICAL EXAM: Filed Vitals:   02/19/15 1350  BP: 138/84  Pulse: 69   General: No acute distress.  Patient appears well-groomed.  normal body habitus. Head:  Normocephalic/atraumatic Eyes:  Fundoscopic exam unremarkable without vessel changes, exudates, hemorrhages or papilledema. Neck: supple, no paraspinal tenderness, full range of motion Heart:  Regular rate and rhythm Lungs:  Clear to auscultation bilaterally Back: No paraspinal tenderness Neurological Exam: alert and oriented to person, place, and time. Attention span and concentration intact, recent and remote memory intact, fund of knowledge intact.  Speech fluent and not dysarthric, language intact.  CN II-XII intact. Fundoscopic exam unremarkable without vessel changes, exudates, hemorrhages or papilledema.  Bulk and tone normal, muscle strength 5/5 throughout.  Sensation to light touch, temperature and vibration intact.  Deep tendon reflexes 2+ throughout, toes downgoing.  Finger to nose and heel to shin testing intact.  Gait normal, Romberg negative.  IMPRESSION: TIA Right sciatica or S1 radiculopathy Essential hypertension. Type 2 diabetes hyperlipidemia  PLAN: 1.  Plavix 2.  Increase lovastatin to 30m daily (LDL goal should be less than 70).  Repeat fasting lipid panel prior  to follow up. 3.  BP and glycemic control 4.  PT for sciatica/radiculopathy.  If ineffective, set up for NCV-EMG 5.  Follow up in 4 to 5 months.  AMetta Clines DO  CC:  KReginia Forts MD

## 2015-03-09 ENCOUNTER — Encounter: Payer: Self-pay | Admitting: Physical Therapy

## 2015-03-09 ENCOUNTER — Ambulatory Visit: Payer: Medicare PPO | Attending: Neurology | Admitting: Physical Therapy

## 2015-03-09 DIAGNOSIS — M5441 Lumbago with sciatica, right side: Secondary | ICD-10-CM | POA: Insufficient documentation

## 2015-03-09 DIAGNOSIS — R293 Abnormal posture: Secondary | ICD-10-CM | POA: Diagnosis present

## 2015-03-09 NOTE — Patient Instructions (Signed)
On Elbows (Prone)    Rise up on elbows as high as possible, keeping hips on floor. Hold __2__ seconds. Repeat _10___ times per set. Do __1__ sets per session. Do _5___ sessions per day.  http://orth.exer.us/92   Copyright  VHI. All rights reserved.  Standing Arch (Extension)    Place hands in small of back. Using hands as fulcrum, arch backward. Try to keep knees straight. Great exercise if sitting makes pain worse. Use to break up long periods of sitting. Repeat __10__ times. Do __5__ sessions per day.  http://gt2.exer.us/247   Copyright  VHI. All rights reserved.

## 2015-03-10 NOTE — Therapy (Signed)
[place] MAIN [facility] SERVICES [address] [place], [place], [address] Phone: [phone]   Fax:  [phone]  Physical Therapy Evaluation  Patient Details  Name: Todd Diaz MRN: [id number] Date of Birth: Nov 14, 1940 Referring Provider: Tomi Likens, ADAM   Encounter Date: 03/09/2015      PT End of Session - 03/10/15 0850    Visit Number 1   Number of Visits 5   Date for PT Re-Evaluation 04/07/15   Authorization Type gcode 1   Authorization Time Period 10   PT Start Time 1515   PT Stop Time 1610   PT Time Calculation (min) 55 min   Activity Tolerance Patient tolerated treatment well   Behavior During Therapy [facility] for tasks assessed/performed      Past Medical History  Diagnosis Date  . Avascular necrosis of medial femoral condyle ([place])   . Hyperlipidemia   . Erectile dysfunction   . Hypertrophy of prostate without urinary obstruction and other lower urinary tract symptoms (LUTS)   . Chronic prostatitis   . Hearing loss   . Infection of urinary tract     ASSESSMENT:Recurrent MRSA UTI F/B Dr. Clayborn Bigness and Dr. Jacqlyn Larsen  . Tobacco use disorder   . TIA (transient ischemic attack) 02/23/2014    R sided weakness and numbness; duration 1 hour; evaluated [facility].  . Diabetes mellitus ([place])     controlled with medication  . Arthritis     Knees  . Hypertension     controlled with medication    Past Surgical History  Procedure Laterality Date  . Prostate surgery    . Bladder stone removal    . Partial knee arthroplasty  07/24/2011    Procedure: UNICOMPARTMENTAL KNEE;  Surgeon: Lorn Junes, MD;  Location: [place];  Service: Orthopedics;  Laterality: Left;  . Amputation  2009    L fifth finger surgery for near amputation. [facility].  . Admission  03/09/2012    [facility]; 24 hour admission; LE numbness/tingling.  CT head, MRI brain, carotid dopplers, 2D-echo.  . Retinal tear repair cryotherapy  08/09/2013    [name]  . Hernia repair  04/09/2013    3  repaired; Gross.  . Eye surgery  08/09/2013    B cataract surgery; [facility]    There were no vitals filed for this visit.  Visit Diagnosis:  Right-sided low back pain with right-sided sciatica - Plan: PT plan of care cert/re-cert  Abnormal posture - Plan: PT plan of care cert/re-cert      Subjective Assessment - 03/09/15 1524    Subjective 75 yo Male reports increased RLE radiculopathy symptoms; He reports having increased difficulty with sitting activities. He reports no pain when standing/walking; He reports putting a pillow under his thighs in his recliner and lays straight out without pain; He reports increased discomfort in RLE with driving after 10 minutes; Currently he is having some numbness in right foot as a result of driving; no pain or numbness with lying down. (typically sleeps on side)   Pertinent History personal factors affecting rehab: uses chewing tobacco, demands of part-time job, increased pain with sitting >10 min in car and having to drive to [facility] for MD appointments.   Limitations Sitting   How long can you sit comfortably? 10 min   How long can you stand comfortably? NA   How long can you walk comfortably? NA   Patient Stated Goals get rid of pain;    Currently in Pain? Yes   Pain  Score 5    Pain Location Leg   Pain Orientation Right   Pain Descriptors / Indicators Burning   Pain Type Chronic pain   Pain Onset More than a month ago   Pain Frequency Intermittent   Aggravating Factors  sitting prolonged;    Pain Relieving Factors movement, lying down; will ease off in 30 min of walking around   Effect of Pain on Daily Activities tries to move more;             [facility] PT Assessment - 03/10/15 0001    Assessment   Medical Diagnosis RLE radiculopathy   Referring Provider JAFFE, ADAM    Hand Dominance Right   Next MD Visit June 15, 2015   Prior Therapy had PT after knee surgery with good results; denies any PT for this condition;    Precautions    Precautions None   Restrictions   Weight Bearing Restrictions No   Balance Screen   Has the patient fallen in the past 6 months No   Has the patient had a decrease in activity level because of a fear of falling?  No   Is the patient reluctant to leave their home because of a fear of falling?  No   Home Environment   Additional Comments lives in single story home with 2 steps to enter with railing; no difficulty;    Prior Function   Level of Independence Independent;Independent with gait;Independent with transfers   Vocation Retired  Garment/textile technologist (part time)   Vocation Requirements no difficulty bending over/lifting;    Leisure play golf, dancing   Cognition   Overall Cognitive Status Within Functional Limits for tasks assessed   Observation/Other Assessments   Modified Oswertry 24% (moderate disability)   Sensation   Light Touch Appears Intact   Additional Comments reports intermittent numbness/tingling in RLE however intact during evaluation;    Coordination   Gross Motor Movements are Fluid and Coordinated Yes   Fine Motor Movements are Fluid and Coordinated Yes   Posture/Postural Control   Posture Comments demonstrates increased thoracic kyphosis (fixed curve); when sitting has decreased lumbar lordosis with slumped posture; able to self correct with verbal cues;    ROM / Strength   AROM / PROM / Strength AROM;Strength   AROM   Overall AROM Comments BUE and BLE AROM is WFL; demonstrates decreased lumbar extension due to stiffness; back ROM is [facility]   Strength   Overall Strength Comments BLE gross strength is 5/5;    Palpation   Spinal mobility hypomobility throughout thoracolumbar spine; however no pain reported during testing;    Palpation comment denies any tenderness to palpation;    Special Tests    Special Tests Lumbar   Slump test   Findings Negative   Side --  bilaterally;   Prone Knee Bend Test   Findings Negative   Side --  bilaterally;   Straight Leg  Raise   Findings Negative   Side  --  bilaterally;   Transfers   Comments patient able to transfer independently with and without pushing from arm rails;    Ambulation/Gait   Gait Comments patient ambulates independently with normal reciprocal gait pattern; does have some postural deficits but otherwise no abnormality noted;    High Level Balance   High Level Balance Comments static and dynamic standing balance is good;          TREATMENT: PT initiated HEP with instruction in lumbar extension exercise.  Patient prone: prone  press ups x10 reps with mod Vcs to relax hips/stomach for improved lumbar extension; Standing lumbar extension hands on hips x10 reps  PT instructed patient to perform extension exercise throughout most of day;  Had patient sit with towel roll x5 min and patient reports centralization of symptoms without numbness in right foot and less pain; Educated patient in use of towel roll when sitting in car and to keep wallet in front pocket;                   PT Education - 03/10/15 0850    Education provided Yes   Education Details HEP, posture   Person(s) Educated Patient   Methods Explanation;Verbal cues   Comprehension Verbalized understanding;Returned demonstration;Verbal cues required             PT Long Term Goals - 03/10/15 0904    PT LONG TERM GOAL #1   Title Patient will be independent in home exercise program to improve strength/mobility for better functional independence with ADLs. by 04/07/15   Time 4   Period Weeks   Status New   PT LONG TERM GOAL #2   Title Patient will report a worst pain of 3/10 on VAS in    RLE/back         to improve tolerance with ADLs and reduced symptoms with activities. by 04/07/15   Time 4   Period Weeks   Status New   PT LONG TERM GOAL #3   Title Patient will tolerate sitting for at least 30 min with good posture without increase in LE pain/numbness to improve tolerance with driving and riding in car  by 04/07/15   Time 4   Period Weeks   Status New   PT LONG TERM GOAL #4   Title  Patient will reduce modified Oswestry score to <20 as to demonstrate minimal disability with ADLs including improved sleeping tolerance, walking/sitting tolerance etc for better mobility with ADLs. by 04/07/15   Time 4   Period Weeks   Status New               Plan - 03/10/15 [id number]    Clinical Impression Statement 75 yo Male presents to physical therapy with RLE radiculopathy. Patient reports only having increased pain with prolonged sitting >10 min. Patient denies any back pain. 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 Patient exhibits increased stiffness with lumbar extension. He sits with slumped posture. After educating patient about sitting posture and providing a towel roll patient's symptoms partially resolved. His symptoms completely resolved following lumbar extension exercise. He would benefit from additional skilled PT intervention to improve lumbar ROM, reduce LE pain and return to PLOF.    Pt will benefit from skilled therapeutic intervention in order to improve on the following deficits Hypomobility;Decreased activity tolerance;Pain;Improper body mechanics;Postural dysfunction;Impaired flexibility   Rehab Potential Good   Clinical Impairments Affecting Rehab Potential positive: motivated, response to exercise; negative: co-morbidities; patient's clinical presentation is stable as he is responding to exercise and symptoms easily resolve.   PT Frequency 1x / week   PT Duration 4 weeks   PT Treatment/Interventions ADLs/Self Care Home Management;Cryotherapy;Electrical Stimulation;Moist Heat;Traction;Balance training;Therapeutic exercise;Therapeutic activities;Functional mobility training;Stair training;Gait training;Neuromuscular re-education;Ultrasound;Patient/family education;Energy conservation;Dry needling   PT Next Visit Plan advance lumbar extension; joint mobs   PT  Home Exercise Plan initiated- see patient instructions;    Consulted and Agree with Plan of Care Patient          G-Codes -  03/10/15 0907    Functional Assessment Tool Used modified oswestry, clinical judgement   Functional Limitation Changing and maintaining body position   Changing and Maintaining Body Position Current Status [phone]) At least 20 percent but less than 40 percent impaired, limited or restricted   Changing and Maintaining Body Position Goal Status [id number]) At least 1 percent but less than 20 percent impaired, limited or restricted       Problem List Patient Active Problem List   Diagnosis Date Noted  . Type 2 diabetes mellitus with complication ([place]) [id number]  . Degenerative disc disease, lumbar 01/12/2015  . Type 2 diabetes mellitus without complication, without long-term current use of insulin ([place]) 01/12/2015  . History of TIA (transient ischemic attack) 01/12/2015  . Right leg numbness 08/18/2014  . Bilateral inguinal hernia (BIH) s/p lap reapir w mesh 03/03/2013 02/26/2013  . TIA (transient ischemic attack) 07/05/2012  . Panic attack 05/20/2012  . Bladder stone 03/25/2012  . Essential hypertension, benign 12/26/2011  . Type II or unspecified type diabetes mellitus without mention of complication, not stated as uncontrolled 12/26/2011  . Impotence 12/26/2011  . Pure hypercholesterolemia 12/26/2011  . Avascular necrosis of medial femoral condyle ([place])     Trotter,Margaret PT, DPT 03/10/2015, 9:08 AM  [place] MAIN [facility] SERVICES [address] [place], [place], [address] Phone: [phone]   Fax:  [phone]  Name: RAYNAV ETHRIDGE MRN: [id number] Date of Birth: 1940-09-07

## 2015-03-15 ENCOUNTER — Encounter: Payer: Medicare PPO | Admitting: Physical Therapy

## 2015-03-18 ENCOUNTER — Ambulatory Visit: Payer: Medicare PPO | Attending: Neurology | Admitting: Physical Therapy

## 2015-03-18 DIAGNOSIS — M5441 Lumbago with sciatica, right side: Secondary | ICD-10-CM | POA: Insufficient documentation

## 2015-03-18 DIAGNOSIS — R293 Abnormal posture: Secondary | ICD-10-CM | POA: Insufficient documentation

## 2015-03-22 ENCOUNTER — Encounter: Payer: Medicare PPO | Admitting: Physical Therapy

## 2015-03-24 ENCOUNTER — Encounter: Payer: Self-pay | Admitting: Physical Therapy

## 2015-03-24 ENCOUNTER — Other Ambulatory Visit: Payer: Self-pay | Admitting: Neurology

## 2015-03-24 ENCOUNTER — Ambulatory Visit: Payer: Medicare PPO | Admitting: Physical Therapy

## 2015-03-24 DIAGNOSIS — R293 Abnormal posture: Secondary | ICD-10-CM

## 2015-03-24 DIAGNOSIS — M5441 Lumbago with sciatica, right side: Secondary | ICD-10-CM | POA: Diagnosis present

## 2015-03-25 NOTE — Telephone Encounter (Signed)
Last OV: 02/19/15 Next OV: 06/30/15\1. Plavix

## 2015-03-25 NOTE — Therapy (Signed)
[place] MAIN [facility] SERVICES [address] [place], [place], [address] Phone: [phone]   Fax:  [phone]  Physical Therapy Treatment  Patient Details  Name: Todd Diaz MRN: [id number] Date of Birth: 12/19/1940 Referring Provider: Tomi Likens, ADAM   Encounter Date: 03/24/2015      PT End of Session - 03/24/15 1551    Visit Number 2   Number of Visits 5   Date for PT Re-Evaluation 04/07/15   Authorization Type gcode 2   Authorization Time Period 10   PT Start Time 1515   PT Stop Time 1600   PT Time Calculation (min) 45 min   Activity Tolerance Patient tolerated treatment well;No increased pain   Behavior During Therapy [facility] for tasks assessed/performed      Past Medical History  Diagnosis Date  . Avascular necrosis of medial femoral condyle ([place])   . Hyperlipidemia   . Erectile dysfunction   . Hypertrophy of prostate without urinary obstruction and other lower urinary tract symptoms (LUTS)   . Chronic prostatitis   . Hearing loss   . Infection of urinary tract     ASSESSMENT:Recurrent MRSA UTI F/B Dr. Clayborn Bigness and Dr. Jacqlyn Larsen  . Tobacco use disorder   . TIA (transient ischemic attack) 02/23/2014    R sided weakness and numbness; duration 1 hour; evaluated [facility].  . Diabetes mellitus ([place])     controlled with medication  . Arthritis     Knees  . Hypertension     controlled with medication    Past Surgical History  Procedure Laterality Date  . Prostate surgery    . Bladder stone removal    . Partial knee arthroplasty  07/24/2011    Procedure: UNICOMPARTMENTAL KNEE;  Surgeon: Lorn Junes, MD;  Location: [place];  Service: Orthopedics;  Laterality: Left;  . Amputation  2009    L fifth finger surgery for near amputation. [facility].  . Admission  03/09/2012    [facility]; 24 hour admission; LE numbness/tingling.  CT head, MRI brain, carotid dopplers, 2D-echo.  . Retinal tear repair cryotherapy  08/09/2013    [name]  . Hernia repair   04/09/2013    3 repaired; [facility].  . Eye surgery  08/09/2013    B cataract surgery; [facility]    There were no vitals filed for this visit.  Visit Diagnosis:  Right-sided low back pain with right-sided sciatica  Abnormal posture      Subjective Assessment - 03/24/15 1550    Subjective Patient reports overall less pain down RLE; He reports compliance with HEP; He does have RLE numbness occasionally especially when sitting on posterior thighs;    Pertinent History personal factors affecting rehab: uses chewing tobacco, demands of part-time job, increased pain with sitting >10 min in car and having to drive to [facility] for MD appointments.   Limitations Sitting   How long can you sit comfortably? 10 min   How long can you stand comfortably? NA   How long can you walk comfortably? NA   Patient Stated Goals get rid of pain;    Currently in Pain? Yes   Pain Score 3    Pain Location Leg   Pain Orientation Right  to foot;    Pain Descriptors / Indicators Burning;Aching   Pain Type Chronic pain   Pain Onset More than a month ago       TREATMENT: Patient prone: Press up on elbows x10 reps x2 sets with cues to relax hips for  better lumbar extension;  PT performed PA mobs along lumbar spine grade II-III L1, L2, L3, L4, L5 10 sec bouts x4 each;  Patient performed press up on elbows with PT performed manual overpressure to facilitate better lumbar extension and hinging along L2, L3 x10 reps x3 sets;  Hip extension x10 reps bilaterally; Alternate knee flexion x10 reps bilaterally Patient requires min VCs to increase hip/knee ROM but to avoid trunk rotation for better extension strengthening;  Patient reports less discomfort with gentle lumbosacral distraction 10 sec hold x3 reps;  PT applied lumbar mechanical traction, with patient prone, 15 sec hold, 10 sec res, 85 pound pull, 45 pound rest x15 min; Patient reports no radicular symptoms following treatment session and no  pain;  Re-educated patient in importance of HEP compliance including standing hip extension at counter for better facilitation of lumbar extension; Patient expressed understanding through demonstration;                            PT Education - 03/24/15 1550    Education provided Yes   Education Details HEP compliance, Traction   Person(s) Educated Patient   Methods Explanation;Verbal cues   Comprehension Verbalized understanding;Returned demonstration;Verbal cues required             PT Long Term Goals - 03/10/15 0904    PT LONG TERM GOAL #1   Title Patient will be independent in home exercise program to improve strength/mobility for better functional independence with ADLs. by 04/07/15   Time 4   Period Weeks   Status New   PT LONG TERM GOAL #2   Title Patient will report a worst pain of 3/10 on VAS in    RLE/back         to improve tolerance with ADLs and reduced symptoms with activities. by 04/07/15   Time 4   Period Weeks   Status New   PT LONG TERM GOAL #3   Title Patient will tolerate sitting for at least 30 min with good posture without increase in LE pain/numbness to improve tolerance with driving and riding in car by 04/07/15   Time 4   Period Weeks   Status New   PT LONG TERM GOAL #4   Title  Patient will reduce modified Oswestry score to <20 as to demonstrate minimal disability with ADLs including improved sleeping tolerance, walking/sitting tolerance etc for better mobility with ADLs. by 04/07/15   Time 4   Period Weeks   Status New               Plan - 03/24/15 1551    Clinical Impression Statement PT performed extensive manual therapy to patients lumbar spine including PA mobs and overpressure into extension; Patient reports less pain down RLE but continued to have numbness to right ankle; Patient reports less pain with gentle lumbosacral distraction; He was put in traction machine in prone; Patient reports centralization of symptoms  with no pain down RLE and into ankle; Patient would benefit from additional skilled PT intervention to improve lumbar ROM and reduce LE radiculopathy;    Pt will benefit from skilled therapeutic intervention in order to improve on the following deficits Hypomobility;Decreased activity tolerance;Pain;Improper body mechanics;Postural dysfunction;Impaired flexibility   Rehab Potential Good   Clinical Impairments Affecting Rehab Potential positive: motivated, response to exercise; negative: co-morbidities; patient's clinical presentation is stable as he is responding to exercise and symptoms easily resolve.   PT Frequency 1x /  week   PT Duration 4 weeks   PT Treatment/Interventions ADLs/Self Care Home Management;Cryotherapy;Electrical Stimulation;Moist Heat;Traction;Balance training;Therapeutic exercise;Therapeutic activities;Functional mobility training;Stair training;Gait training;Neuromuscular re-education;Ultrasound;Patient/family education;Energy conservation;Dry needling   PT Next Visit Plan advance lumbar extension; joint mobs   PT Home Exercise Plan initiated- see patient instructions;    Consulted and Agree with Plan of Care Patient        Problem List Patient Active Problem List   Diagnosis Date Noted  . Type 2 diabetes mellitus with complication ([place]) [id number]  . Degenerative disc disease, lumbar 01/12/2015  . Type 2 diabetes mellitus without complication, without long-term current use of insulin ([place]) 01/12/2015  . History of TIA (transient ischemic attack) 01/12/2015  . Right leg numbness 08/18/2014  . Bilateral inguinal hernia (BIH) s/p lap reapir w mesh 03/03/2013 02/26/2013  . TIA (transient ischemic attack) 07/05/2012  . Panic attack 05/20/2012  . Bladder stone 03/25/2012  . Essential hypertension, benign 12/26/2011  . Type II or unspecified type diabetes mellitus without mention of complication, not stated as uncontrolled 12/26/2011  . Impotence 12/26/2011  . Pure  hypercholesterolemia 12/26/2011  . Avascular necrosis of medial femoral condyle ([place])     Manville Rico PT, DPT 03/25/2015, 9:45 AM  [place] MAIN [facility] SERVICES [address] [place], [place], [address] Phone: [phone]   Fax:  [phone]  Name: Todd Diaz MRN: [id number] Date of Birth: August 11, 1940

## 2015-03-29 ENCOUNTER — Encounter: Payer: Medicare PPO | Admitting: Physical Therapy

## 2015-03-31 ENCOUNTER — Ambulatory Visit: Payer: Medicare PPO | Admitting: Physical Therapy

## 2015-04-01 ENCOUNTER — Ambulatory Visit: Payer: Medicare PPO

## 2015-04-01 DIAGNOSIS — M5441 Lumbago with sciatica, right side: Secondary | ICD-10-CM | POA: Diagnosis not present

## 2015-04-01 DIAGNOSIS — R293 Abnormal posture: Secondary | ICD-10-CM

## 2015-04-01 NOTE — Therapy (Signed)
[place] MAIN [facility] SERVICES [address] [place], [place], [address] Phone: [phone]   Fax:  [phone]  Physical Therapy Treatment  Patient Details  Name: Todd Diaz MRN: [id number] Date of Birth: 08-Nov-1940 Referring Provider: Tomi Likens, ADAM   Encounter Date: 04/01/2015      PT End of Session - 04/01/15 1523    Visit Number 3   Number of Visits 5   Date for PT Re-Evaluation 04/07/15   Authorization Type gcode 3   Authorization Time Period 10   PT Start Time 1430   PT Stop Time 1515   PT Time Calculation (min) 45 min   Activity Tolerance Patient tolerated treatment well;No increased pain   Behavior During Therapy [facility] for tasks assessed/performed      Past Medical History  Diagnosis Date  . Avascular necrosis of medial femoral condyle ([place])   . Hyperlipidemia   . Erectile dysfunction   . Hypertrophy of prostate without urinary obstruction and other lower urinary tract symptoms (LUTS)   . Chronic prostatitis   . Hearing loss   . Infection of urinary tract     ASSESSMENT:Recurrent MRSA UTI F/B Dr. Clayborn Bigness and Dr. Jacqlyn Larsen  . Tobacco use disorder   . TIA (transient ischemic attack) 02/23/2014    R sided weakness and numbness; duration 1 hour; evaluated [facility].  . Diabetes mellitus ([place])     controlled with medication  . Arthritis     Knees  . Hypertension     controlled with medication    Past Surgical History  Procedure Laterality Date  . Prostate surgery    . Bladder stone removal    . Partial knee arthroplasty  07/24/2011    Procedure: UNICOMPARTMENTAL KNEE;  Surgeon: Lorn Junes, MD;  Location: [place];  Service: Orthopedics;  Laterality: Left;  . Amputation  2009    L fifth finger surgery for near amputation. [facility].  . Admission  03/09/2012    [facility]; 24 hour admission; LE numbness/tingling.  CT head, MRI brain, carotid dopplers, 2D-echo.  . Retinal tear repair cryotherapy  08/09/2013    [name]  . Hernia repair   04/09/2013    3 repaired; [facility].  . Eye surgery  08/09/2013    B cataract surgery; [place]    There were no vitals filed for this visit.  Visit Diagnosis:  Right-sided low back pain with right-sided sciatica  Abnormal posture      Subjective Assessment - 04/01/15 1439    Subjective pt reports overall his symptoms are much better. pt reports his RLE relief lasted several days.    Pertinent History personal factors affecting rehab: uses chewing tobacco, demands of part-time job, increased pain with sitting >10 min in car and having to drive to [facility] for MD appointments.   Limitations Sitting   How long can you sit comfortably? 10 min   How long can you stand comfortably? NA   How long can you walk comfortably? NA   Patient Stated Goals get rid of pain;    Currently in Pain? Yes   Pain Score 3    Pain Location --  RLE   Pain Onset More than a month ago        therex:  Piriformis stretch 30s x 2 Prone quad stretch 30s  X 2 All following performed with Diaphragmatic breathing (initiating TA contraction)  Alt LE march 2x10 Bridging 2x10 Prone alt LE ext 2x10 Prone press up with PT induced overpressure at end  range x 10 Pt requires min verbal and tactile cues for proper exercise performance     PT applied lumbar mechanical traction, with patient prone, 15 sec hold, 10 sec res, 85 pound pull, 45 pound rest x15 min;                             PT Long Term Goals - 03/10/15 0904    PT LONG TERM GOAL #1   Title Patient will be independent in home exercise program to improve strength/mobility for better functional independence with ADLs. by 04/07/15   Time 4   Period Weeks   Status New   PT LONG TERM GOAL #2   Title Patient will report a worst pain of 3/10 on VAS in    RLE/back         to improve tolerance with ADLs and reduced symptoms with activities. by 04/07/15   Time 4   Period Weeks   Status New   PT LONG TERM GOAL #3   Title Patient will  tolerate sitting for at least 30 min with good posture without increase in LE pain/numbness to improve tolerance with driving and riding in car by 04/07/15   Time 4   Period Weeks   Status New   PT LONG TERM GOAL #4   Title  Patient will reduce modified Oswestry score to <20 as to demonstrate minimal disability with ADLs including improved sleeping tolerance, walking/sitting tolerance etc for better mobility with ADLs. by 04/07/15   Time 4   Period Weeks   Status New               Plan - 04/01/15 1523    Clinical Impression Statement pt responds well to mechanical traction with complete relief of his RLE numbness. pt reports he has numbness when driving , but no in passenger side. he reports he is using the towel roll. PT assessed neural tension which was (-). pt does have increased tightness of hte R piriformis and the R hip flexor. progressed HEP today to include flexibility and core strengthening.    Pt will benefit from skilled therapeutic intervention in order to improve on the following deficits Hypomobility;Decreased activity tolerance;Pain;Improper body mechanics;Postural dysfunction;Impaired flexibility   Rehab Potential Good   Clinical Impairments Affecting Rehab Potential positive: motivated, response to exercise; negative: co-morbidities; patient's clinical presentation is stable as he is responding to exercise and symptoms easily resolve.   PT Frequency 1x / week   PT Duration 4 weeks   PT Treatment/Interventions ADLs/Self Care Home Management;Cryotherapy;Electrical Stimulation;Moist Heat;Traction;Balance training;Therapeutic exercise;Therapeutic activities;Functional mobility training;Stair training;Gait training;Neuromuscular re-education;Ultrasound;Patient/family education;Energy conservation;Dry needling   PT Next Visit Plan advance lumbar extension; joint mobs   PT Home Exercise Plan initiated- see patient instructions;    Consulted and Agree with Plan of Care Patient         Problem List Patient Active Problem List   Diagnosis Date Noted  . Type 2 diabetes mellitus with complication ([place]) [id number]  . Degenerative disc disease, lumbar 01/12/2015  . Type 2 diabetes mellitus without complication, without long-term current use of insulin ([place]) 01/12/2015  . History of TIA (transient ischemic attack) 01/12/2015  . Right leg numbness 08/18/2014  . Bilateral inguinal hernia (BIH) s/p lap reapir w mesh 03/03/2013 02/26/2013  . TIA (transient ischemic attack) 07/05/2012  . Panic attack 05/20/2012  . Bladder stone 03/25/2012  . Essential hypertension, benign 12/26/2011  . Type II or unspecified type  diabetes mellitus without mention of complication, not stated as uncontrolled 12/26/2011  . Impotence 12/26/2011  . Pure hypercholesterolemia 12/26/2011  . Avascular necrosis of medial femoral condyle ([place])    Zay Yeargan C. Jannah Guardiola, PT, DPT [phone]  Desire Fulp 04/01/2015, 3:27 PM  [place] MAIN [facility] SERVICES [address] [place], [place], [address] Phone: [phone]   Fax:  [phone]  Name: GIAVONNI HEADLEE MRN: [id number] Date of Birth: 12-13-1940

## 2015-04-01 NOTE — Patient Instructions (Signed)
[email or web address] Piriformis stretch 30s x 2 Prone quad stretch 30s  X 2 All following performed with Diaphragmatic breathing (initiating TA contraction)  Alt LE march 2x10 Bridging 2x10 Prone alt LE ext 2x10

## 2015-04-05 ENCOUNTER — Encounter: Payer: Medicare PPO | Admitting: Physical Therapy

## 2015-04-07 ENCOUNTER — Ambulatory Visit: Payer: Medicare PPO | Admitting: Physical Therapy

## 2015-04-12 ENCOUNTER — Encounter: Payer: Medicare PPO | Admitting: Physical Therapy

## 2015-04-13 ENCOUNTER — Encounter: Payer: Self-pay | Admitting: Physical Therapy

## 2015-04-13 ENCOUNTER — Ambulatory Visit: Payer: Medicare PPO | Attending: Neurology | Admitting: Physical Therapy

## 2015-04-13 DIAGNOSIS — M5441 Lumbago with sciatica, right side: Secondary | ICD-10-CM | POA: Insufficient documentation

## 2015-04-13 DIAGNOSIS — R293 Abnormal posture: Secondary | ICD-10-CM | POA: Diagnosis present

## 2015-04-13 NOTE — Therapy (Signed)
[name] [facility] SERVICES [address] [place], [place], [address] Phone: [phone]   Fax:  [phone]  Physical Therapy Treatment  Patient Details  Name: Todd Diaz MRN: [id number] Date of Birth: June 11, 1940 Referring Provider: Tomi Diaz, Todd Diaz   Encounter Date: 04/13/2015      PT End of Session - 04/13/15 0943    Visit Number 4   Number of Visits 8   Date for PT Re-Evaluation 05/04/15   Authorization Type gcode 1   Authorization Time Period 10   PT Start Time 0930   PT Stop Time 5631   PT Time Calculation (min) 45 min   Activity Tolerance Patient tolerated treatment well;No increased pain   Behavior During Therapy [facility] for tasks assessed/performed      Past Medical History  Diagnosis Date  . Avascular necrosis of medial femoral condyle ([place])   . Hyperlipidemia   . Erectile dysfunction   . Hypertrophy of prostate without urinary obstruction and other lower urinary tract symptoms (LUTS)   . Chronic prostatitis   . Hearing loss   . Infection of urinary tract     ASSESSMENT:Recurrent MRSA UTI F/B Dr. Clayborn Diaz and Dr. Jacqlyn Diaz  . Tobacco use disorder   . TIA (transient ischemic attack) 02/23/2014    R sided weakness and numbness; duration 1 hour; evaluated [facility].  . Diabetes mellitus ([place])     controlled with medication  . Arthritis     Knees  . Hypertension     controlled with medication    Past Surgical History  Procedure Laterality Date  . Prostate surgery    . Bladder stone removal    . Partial knee arthroplasty  07/24/2011    Procedure: UNICOMPARTMENTAL KNEE;  Surgeon: Todd Junes, MD;  Location: [place];  Service: Orthopedics;  Laterality: Left;  . Amputation  2009    L fifth finger surgery for near amputation. [facility].  . Admission  03/09/2012    [facility]; 24 hour admission; LE numbness/tingling.  CT head, MRI brain, carotid dopplers, 2D-echo.  . Retinal tear repair cryotherapy  08/09/2013    Todd Diaz  . Hernia repair   04/09/2013    3 repaired; [facility].  . Eye surgery  08/09/2013    B cataract surgery; Todd Diaz    There were no vitals filed for this visit.  Visit Diagnosis:  Right-sided low back pain with right-sided sciatica - Plan: PT plan of care cert/re-cert  Abnormal posture - Plan: PT plan of care cert/re-cert      Subjective Assessment - 04/13/15 0938    Subjective Patient reports feeling a "little better" He reports still having pain when sitting down in certain chairs but then the pain subsides with immediate standing/walking; He reports compliance with HEP;    Pertinent History personal factors affecting rehab: uses chewing tobacco, demands of part-time job, increased pain with sitting >10 min in car and having to drive to [facility] for MD appointments.   Limitations Sitting   How long can you sit comfortably? 10 min   How long can you stand comfortably? NA   How long can you walk comfortably? NA   Patient Stated Goals get rid of pain;    Currently in Pain? No/denies   Pain Onset More than a month ago            [facility] PT Assessment - 04/13/15 0001    Observation/Other Assessments   Modified Oswertry 12% (minimal disability, improved from initial eval on 03/09/15 which  was 24%)         TREATMENT:  Warm up on Nustep BUE/BLE level 3 x 5 min (unbilled);  PT instructed patient in modified oswestry and assessed pain levels to determine progress towards goals; see above;   PT applied lumbar mechanical traction, 15 sec pull, 10 sec rest, 85# pull, 50# hold x15 min in prone; Patient reports no radicular symptoms following treatment session;  PT re-educated patient in HEP: Seated, extension against back of chair with cues to improve lumbar extension with all sitting for less radicular symptoms; Seated, right piriformis stretch 15 sec hold x2 with cues to improve ROM to increased flexibility and reduce radicular symptoms;  Standing: Lumbar extension facing wall x10 reps; Standing lumbar  extension with sheet around back, pulling against lumbar spine to facilitate better lordosis; Standing lumbar extension hands on hips x15 reps; Patient instructed to increase repetition for better flexibility and reduce stiffness;  Patient continues to exhibit sway back posture with increased thoracic kyphosis;                       PT Education - 04/13/15 0938    Education provided Yes   Education Details progress towards goals, traction, HEP   Person(s) Educated Patient   Methods Explanation;Verbal cues   Comprehension Verbalized understanding;Returned demonstration;Verbal cues required             PT Long Term Goals - 04/13/15 0943    PT LONG TERM GOAL #1   Title Patient will be independent in home exercise program to improve strength/mobility for better functional independence with ADLs. by 05/04/15   Time 4   Period Weeks   Status On-going   PT LONG TERM GOAL #2   Title Patient will report a worst pain of 3/10 on VAS in    RLE/back         to improve tolerance with ADLs and reduced symptoms with activities. by 05/04/15   Time 4   Period Weeks   Status New   PT LONG TERM GOAL #3   Title Patient will tolerate sitting for at least 30 min with good posture without increase in LE pain/numbness to improve tolerance with driving and riding in car by 05/04/15   Baseline pain comes on in 15 min   Time 4   Period Weeks   Status Partially Met   PT LONG TERM GOAL #4   Title  Patient will reduce modified Oswestry score to <20 as to demonstrate minimal disability with ADLs including improved sleeping tolerance, walking/sitting tolerance etc for better mobility with ADLs. by 05/04/15   Time 4   Period Weeks   Status Achieved               Plan - 04/13/15 1021    Clinical Impression Statement Patient seems to responding well to conservative treatment. He has no RLE radicular symptoms following traction for a few days. He does continue to have stiffness in  lumbar spine with extension. He would benefit from additional skilled PT intervention to improve lumbar ROM and reduce pain;    Pt will benefit from skilled therapeutic intervention in order to improve on the following deficits Hypomobility;Decreased activity tolerance;Pain;Improper body mechanics;Postural dysfunction;Impaired flexibility   Rehab Potential Good   Clinical Impairments Affecting Rehab Potential positive: motivated, response to exercise; negative: co-morbidities; patient's clinical presentation is stable as he is responding to exercise and symptoms easily resolve.   PT Frequency 1x / week   PT  Duration 3 weeks   PT Treatment/Interventions ADLs/Self Care Home Management;Cryotherapy;Electrical Stimulation;Moist Heat;Traction;Balance training;Therapeutic exercise;Therapeutic activities;Functional mobility training;Stair training;Gait training;Neuromuscular re-education;Ultrasound;Patient/family education;Energy conservation;Dry needling   PT Next Visit Plan advance lumbar extension; joint mobs   PT Home Exercise Plan re-educated in HEP    Consulted and Agree with Plan of Care Patient          G-Codes - 2015/04/16 1034    Functional Assessment Tool Used modified oswestry, clinical judgement, has ability to make additional progress   Functional Limitation Changing and maintaining body position   Changing and Maintaining Body Position Current Status (H8850) At least 1 percent but less than 20 percent impaired, limited or restricted   Changing and Maintaining Body Position Goal Status (Y7741) At least 1 percent but less than 20 percent impaired, limited or restricted      Problem List Patient Active Problem List   Diagnosis Date Noted  . Type 2 diabetes mellitus with complication ([place]) 28/78/6767  . Degenerative disc disease, lumbar 01/12/2015  . Type 2 diabetes mellitus without complication, without long-term current use of insulin ([place]) 01/12/2015  . History of TIA (transient  ischemic attack) 01/12/2015  . Right leg numbness 08/18/2014  . Bilateral inguinal hernia (BIH) s/p lap reapir w mesh 03/03/2013 02/26/2013  . TIA (transient ischemic attack) 07/05/2012  . Panic attack 05/20/2012  . Bladder stone 03/25/2012  . Essential hypertension, benign 12/26/2011  . Type II or unspecified type diabetes mellitus without mention of complication, not stated as uncontrolled 12/26/2011  . Impotence 12/26/2011  . Pure hypercholesterolemia 12/26/2011  . Avascular necrosis of medial femoral condyle ([place])     Trotter,MargaretPT, DPT 04-16-15, 10:36 AM  [place] MAIN [facility] SERVICES [address] [place], [place], [address] Phone: [phone]   Fax:  [phone]  Name: Todd Diaz MRN: [id number] Date of Birth: 08-Feb-1940

## 2015-04-14 ENCOUNTER — Encounter: Payer: Medicare PPO | Admitting: Physical Therapy

## 2015-04-17 ENCOUNTER — Other Ambulatory Visit: Payer: Self-pay | Admitting: Family Medicine

## 2015-04-19 ENCOUNTER — Encounter: Payer: Medicare PPO | Admitting: Physical Therapy

## 2015-04-21 ENCOUNTER — Encounter: Payer: Medicare PPO | Admitting: Physical Therapy

## 2015-04-26 ENCOUNTER — Encounter: Payer: Medicare PPO | Admitting: Physical Therapy

## 2015-04-28 ENCOUNTER — Ambulatory Visit: Payer: Medicare PPO | Admitting: Physical Therapy

## 2015-05-05 ENCOUNTER — Ambulatory Visit: Payer: Medicare PPO | Admitting: Physical Therapy

## 2015-05-06 ENCOUNTER — Encounter: Payer: Medicare PPO | Admitting: Physical Therapy

## 2015-05-10 ENCOUNTER — Other Ambulatory Visit: Payer: Self-pay | Admitting: Family Medicine

## 2015-05-12 ENCOUNTER — Ambulatory Visit: Payer: Medicare PPO | Attending: Neurology | Admitting: Physical Therapy

## 2015-05-17 ENCOUNTER — Encounter: Payer: Medicare PPO | Admitting: Family Medicine

## 2015-06-08 ENCOUNTER — Other Ambulatory Visit: Payer: Self-pay | Admitting: Neurology

## 2015-06-08 NOTE — Telephone Encounter (Signed)
Plavix refill requested. Per last office note- patient to remain on medication. Refill approved and sent to patient's pharmacy.

## 2015-06-15 ENCOUNTER — Ambulatory Visit (INDEPENDENT_AMBULATORY_CARE_PROVIDER_SITE_OTHER): Payer: Medicare PPO | Admitting: Family Medicine

## 2015-06-15 ENCOUNTER — Encounter: Payer: Self-pay | Admitting: Family Medicine

## 2015-06-15 VITALS — BP 126/73 | HR 70 | Temp 98.0°F | Resp 16 | Ht 70.25 in | Wt 204.4 lb

## 2015-06-15 DIAGNOSIS — N400 Enlarged prostate without lower urinary tract symptoms: Secondary | ICD-10-CM | POA: Diagnosis not present

## 2015-06-15 DIAGNOSIS — E119 Type 2 diabetes mellitus without complications: Secondary | ICD-10-CM

## 2015-06-15 DIAGNOSIS — Z1211 Encounter for screening for malignant neoplasm of colon: Secondary | ICD-10-CM

## 2015-06-15 DIAGNOSIS — E7800 Pure hypercholesterolemia, unspecified: Secondary | ICD-10-CM

## 2015-06-15 DIAGNOSIS — Z0000 Encounter for general adult medical examination without abnormal findings: Secondary | ICD-10-CM

## 2015-06-15 DIAGNOSIS — M5136 Other intervertebral disc degeneration, lumbar region: Secondary | ICD-10-CM | POA: Diagnosis not present

## 2015-06-15 DIAGNOSIS — I10 Essential (primary) hypertension: Secondary | ICD-10-CM | POA: Diagnosis not present

## 2015-06-15 DIAGNOSIS — N529 Male erectile dysfunction, unspecified: Secondary | ICD-10-CM

## 2015-06-15 DIAGNOSIS — L989 Disorder of the skin and subcutaneous tissue, unspecified: Secondary | ICD-10-CM | POA: Diagnosis not present

## 2015-06-15 DIAGNOSIS — F410 Panic disorder [episodic paroxysmal anxiety] without agoraphobia: Secondary | ICD-10-CM

## 2015-06-15 DIAGNOSIS — Z8673 Personal history of transient ischemic attack (TIA), and cerebral infarction without residual deficits: Secondary | ICD-10-CM | POA: Diagnosis not present

## 2015-06-15 DIAGNOSIS — N210 Calculus in bladder: Secondary | ICD-10-CM

## 2015-06-15 LAB — CBC WITH DIFFERENTIAL/PLATELET
Basophils Absolute: 59 cells/uL (ref 0–200)
Basophils Relative: 1 %
Eosinophils Absolute: 236 cells/uL (ref 15–500)
Eosinophils Relative: 4 %
HCT: 47.5 % (ref 38.5–50.0)
Hemoglobin: 16.2 g/dL (ref 13.2–17.1)
Lymphocytes Relative: 34 %
Lymphs Abs: 2006 cells/uL (ref 850–3900)
MCH: 30.2 pg (ref 27.0–33.0)
MCHC: 34.1 g/dL (ref 32.0–36.0)
MCV: 88.5 fL (ref 80.0–100.0)
MPV: 10.6 fL (ref 7.5–12.5)
Monocytes Absolute: 472 cells/uL (ref 200–950)
Monocytes Relative: 8 %
Neutro Abs: 3127 cells/uL (ref 1500–7800)
Neutrophils Relative %: 53 %
Platelets: 199 10*3/uL (ref 140–400)
RBC: 5.37 MIL/uL (ref 4.20–5.80)
RDW: 14.3 % (ref 11.0–15.0)
WBC: 5.9 10*3/uL (ref 3.8–10.8)

## 2015-06-15 LAB — LIPID PANEL
Cholesterol: 168 mg/dL (ref 125–200)
HDL: 28 mg/dL — ABNORMAL LOW (ref >=40)
LDL Cholesterol: 68 mg/dL (ref <130)
Total CHOL/HDL Ratio: 6 Ratio — ABNORMAL HIGH (ref <=5.0)
Triglycerides: 359 mg/dL — ABNORMAL HIGH (ref <150)
VLDL: 72 mg/dL — ABNORMAL HIGH (ref <30)

## 2015-06-15 LAB — POCT URINALYSIS DIP (MANUAL ENTRY)
Bilirubin, UA: NEGATIVE
Blood, UA: NEGATIVE
Glucose, UA: NEGATIVE
Ketones, POC UA: NEGATIVE
Nitrite, UA: NEGATIVE
Protein Ur, POC: NEGATIVE
Spec Grav, UA: 1.02
Urobilinogen, UA: 0.2
pH, UA: 7

## 2015-06-15 LAB — COMPREHENSIVE METABOLIC PANEL
ALT: 33 U/L (ref 9–46)
AST: 33 U/L (ref 10–35)
Albumin: 4.3 g/dL (ref 3.6–5.1)
Alkaline Phosphatase: 75 U/L (ref 40–115)
BUN: 12 mg/dL (ref 7–25)
CO2: 26 mmol/L (ref 20–31)
Calcium: 10.1 mg/dL (ref 8.6–10.3)
Chloride: 101 mmol/L (ref 98–110)
Creat: 0.81 mg/dL (ref 0.70–1.18)
Glucose, Bld: 111 mg/dL — ABNORMAL HIGH (ref 65–99)
Potassium: 4 mmol/L (ref 3.5–5.3)
Sodium: 139 mmol/L (ref 135–146)
Total Bilirubin: 0.4 mg/dL (ref 0.2–1.2)
Total Protein: 7.1 g/dL (ref 6.1–8.1)

## 2015-06-15 LAB — HEMOGLOBIN A1C
Hgb A1c MFr Bld: 6.3 % — ABNORMAL HIGH (ref <5.7)
Mean Plasma Glucose: 134 mg/dL

## 2015-06-15 MED ORDER — CLOPIDOGREL BISULFATE 75 MG PO TABS: 75.0000 mg | ORAL_TABLET | Freq: Every day | ORAL | Status: DC

## 2015-06-15 MED ORDER — LANCETS MISC: Status: DC

## 2015-06-15 MED ORDER — LISINOPRIL 20 MG PO TABS: 20.0000 mg | ORAL_TABLET | Freq: Every day | ORAL | Status: DC

## 2015-06-15 MED ORDER — LOVASTATIN 20 MG PO TABS: 20.0000 mg | ORAL_TABLET | Freq: Every day | ORAL | Status: DC

## 2015-06-15 MED ORDER — GLUCOSE BLOOD VI STRP: ORAL_STRIP | Status: DC

## 2015-06-15 MED ORDER — HYDROCHLOROTHIAZIDE 25 MG PO TABS: 25.0000 mg | ORAL_TABLET | Freq: Every day | ORAL | Status: DC

## 2015-06-15 MED ORDER — DIAZEPAM 5 MG PO TABS: ORAL_TABLET | ORAL | Status: DC

## 2015-06-15 MED ORDER — METFORMIN HCL 500 MG PO TABS: ORAL_TABLET | ORAL | Status: DC

## 2015-06-15 NOTE — Progress Notes (Signed)
Subjective:    Patient ID: Todd Diaz, male    DOB: 05-06-1940, 75 y.o.   MRN: [id number]  06/15/2015  Annual Exam and Medication Refill   HPI This 75 y.o. male presents for Annual Wellness Examination.  Last physical: Colonoscopy:  Never; refuses; hemosure provided 01/2015.   TDAP:  2013 Pneumovax:  2013; 2015 Zostavax:  No chickenpox Influenza:  01/2015 Eye exam:  07/2014  Ellin Mayhew; [facility] Dental exam:    None lately; jot sure.   Bladder stone/BPH:  Requesting referral back to urology.    DMII: Patient reports good compliance with medication, good tolerance to medication, and good symptom control.    Hyperlipidemia:  Patient reports good compliance with medication, good tolerance to medication, and good symptom control.    HTN: Patient reports good compliance with medication, good tolerance to medication, and good symptom control.    DDD lumbar spine: s/p physical therapy; [facility].  Appointment this month.   Review of Systems  Constitutional: Negative for fever, chills, diaphoresis, activity change, appetite change, fatigue and unexpected weight change.  HENT: Positive for hearing loss. Negative for congestion, dental problem, drooling, ear discharge, ear pain, facial swelling, mouth sores, nosebleeds, postnasal drip, rhinorrhea, sinus pressure, sneezing, sore throat, tinnitus, trouble swallowing and voice change.   Eyes: Negative for photophobia, pain, discharge, redness, itching and visual disturbance.  Respiratory: Negative for apnea, cough, choking, chest tightness, shortness of breath, wheezing and stridor.   Cardiovascular: Negative for chest pain, palpitations and leg swelling.  Gastrointestinal: Negative for nausea, vomiting, abdominal pain, diarrhea, constipation and blood in stool.  Endocrine: Negative for cold intolerance, heat intolerance, polydipsia, polyphagia and polyuria.  Genitourinary: Negative for dysuria, urgency, frequency, hematuria, flank pain,  decreased urine volume, discharge, penile swelling, scrotal swelling, enuresis, difficulty urinating, genital sores, penile pain and testicular pain.       No urinary leakage.  Musculoskeletal: Negative for myalgias, back pain, joint swelling, arthralgias, gait problem, neck pain and neck stiffness.  Skin: Negative for color change, pallor, rash and wound.  Allergic/Immunologic: Negative for environmental allergies, food allergies and immunocompromised state.  Neurological: Negative for dizziness, tremors, seizures, syncope, facial asymmetry, speech difficulty, weakness, light-headedness, numbness and headaches.  Hematological: Negative for adenopathy. Does not bruise/bleed easily.  Psychiatric/Behavioral: Negative for suicidal ideas, hallucinations, behavioral problems, confusion, sleep disturbance, self-injury, dysphoric mood, decreased concentration and agitation. The patient is not nervous/anxious and is not hyperactive.        Bedtime 10:30-11:00pm; wakes up 5:00am.      Past Medical History  Diagnosis Date  . Avascular necrosis of medial femoral condyle ([place])   . Hyperlipidemia   . Erectile dysfunction   . Hypertrophy of prostate without urinary obstruction and other lower urinary tract symptoms (LUTS)   . Chronic prostatitis   . Hearing loss   . Infection of urinary tract     ASSESSMENT:Recurrent MRSA UTI F/B Dr. Clayborn Bigness and Dr. Jacqlyn Larsen  . Tobacco use disorder   . TIA (transient ischemic attack) 02/23/2014    R sided weakness and numbness; duration 1 hour; evaluated [facility].  . Diabetes mellitus ([place])     controlled with medication  . Arthritis     Knees  . Hypertension     controlled with medication   Past Surgical History  Procedure Laterality Date  . Prostate surgery    . Bladder stone removal    . Partial knee arthroplasty  07/24/2011    Procedure: UNICOMPARTMENTAL KNEE;  Surgeon: Lorn Junes, MD;  Location:  [facility];  Service: Orthopedics;  Laterality: Left;  . Amputation   2009    L fifth finger surgery for near amputation. [facility].  . Admission  03/09/2012    [facility]; 24 hour admission; LE numbness/tingling.  CT head, MRI brain, carotid dopplers, 2D-echo.  . Retinal tear repair cryotherapy  08/09/2013    [name]  . Hernia repair  04/09/2013    3 repaired; [facility].  . Eye surgery  08/09/2013    B cataract surgery; [facility]  . Joint replacement     No Known Allergies  Social History   Social History  . Marital Status: Widowed    Spouse Name: N/A  . Number of Children: 4  . Years of Education: 12   Occupational History  . 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 Smoking status: Former Smoker -- 0.50 packs/day for 20 years    Types: Cigarettes    Quit date: 01/09/1986  . Smokeless tobacco: Current User    Types: Snuff     Comment: chews about 1/2 a pack of tobacco currently;   . Alcohol Use: 0.0 oz/week    0 Standard drinks or equivalent per week     Comment: occasional 12 beers per weekend night.  DUI in 2000.  . Drug Use: No  . Sexual Activity:    Partners: Female    Museum/gallery curator: None, Post-menopausal   Other Topics Concern  . Not on file   Social History Narrative   Widowed since 02/2009 after 14 years second marriage; engaged in 01/2011.        Children: 6 children, 2 stepchildren; 8 grandchildren, 2 gg.      Lives: with fiance, cat, dog.      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      Tobacco: quit smoking age 39; smoked x 22 years.      Alcohol:  Beer every weekend (12 pack-24 pack).      Seatbelt:  Always uses seat belts, Smoke alarm in the home,      Guns: >5 guns in the home loaded and locked up.      Caffeine [id number], Tea, Carbonated beverages; consumes a moderate amount.      Exercise: Inactive.     Advanced Directives:  +LIVING WILL; FULL CODE, No HCPOA.      ADLs: independent with ADLs.  Drives.   Family History  Problem Relation Age of Onset  .  Hypertension Mother   . Cancer Mother     lung  . Heart attack Father   . Hypertension Father   . Hyperlipidemia Father   . Aneurysm Father 83    Brain  . Heart disease Father 45    AMI as cause of death  . Diabetes Son        Objective:    BP 126/73 mmHg  Pulse 70  Temp(Src) 98 F (36.7 C) (Oral)  Resp 16  Ht 5' 10.25" (1.784 m)  Wt 204 lb 6.4 oz (92.715 kg)  BMI 29.13 kg/m2  SpO2 98% Physical Exam  Constitutional: He is oriented to person, place, and time. He appears well-developed and well-nourished. No distress.  HENT:  Head: Normocephalic and atraumatic.  Right Ear: External ear normal.  Left Ear: External ear normal.  Nose: Nose normal.  Mouth/Throat: Oropharynx is clear and moist.  Eyes: Conjunctivae and EOM are normal. Pupils are equal, round, and  reactive to light.  Neck: Normal range of motion. Neck supple. Carotid bruit is not present. No thyromegaly present.  Cardiovascular: Normal rate, regular rhythm, normal heart sounds and intact distal pulses.  Exam reveals no gallop and no friction rub.   No murmur heard. Pulmonary/Chest: Effort normal and breath sounds normal. He has no wheezes. He has no rales.  Abdominal: Soft. Bowel sounds are normal. He exhibits no distension and no mass. There is no tenderness. There is no rebound and no guarding.  Musculoskeletal:       Right shoulder: Normal.       Left shoulder: Normal.       Cervical back: Normal.  Lymphadenopathy:    He has no cervical adenopathy.  Neurological: He is alert and oriented to person, place, and time. He has normal reflexes. No cranial nerve deficit. He exhibits normal muscle tone. Coordination normal.  Skin: Skin is warm and dry. No rash noted. He is not diaphoretic.  Psychiatric: He has a normal mood and affect. His behavior is normal. Judgment and thought content normal.   Depression screen [facility] 2/9 06/15/2015 01/11/2015 03/25/2014 10/13/2013 03/25/2012  Decreased Interest 0 0 0 0 0  Down,  Depressed, Hopeless 0 0 0 0 0  PHQ - 2 Score 0 0 0 0 0    Fall Risk  06/15/2015 02/19/2015 01/11/2015 03/25/2014 10/13/2013  Falls in the past year? No No No No No  Functional Status Survey: Is the patient deaf or have difficulty hearing?: Yes Does the patient have difficulty seeing, even when wearing glasses/contacts?: No Does the patient have difficulty concentrating, remembering, or making decisions?: No Does the patient have difficulty walking or climbing stairs?: No Does the patient have difficulty dressing or bathing?: No Does the patient have difficulty doing errands alone such as visiting a doctor's office or shopping?: No      Assessment & Plan:   1. Encounter for Medicare annual wellness exam   2. Type 2 diabetes mellitus without complication, without long-term current use of insulin ([place])   3. History of TIA (transient ischemic attack)   4. Degenerative disc disease, lumbar   5. Panic attack   6. Bladder stone   7. Impotence   8. Essential hypertension, benign   9. Pure hypercholesterolemia   10. BPH (benign prostatic hyperplasia)   11. Skin lesion of back   12. Colon cancer screening     Orders Placed This Encounter  Procedures  . Microalbumin, urine  . CBC with Differential/Platelet  . Comprehensive metabolic panel    Order Specific Question:  Has the patient fasted?    Answer:  Yes  . Hemoglobin A1c  . Lipid panel    Order Specific Question:  Has the patient fasted?    Answer:  Yes  . TSH  . Ambulatory referral to Urology    Referral Priority:  Routine    Referral Type:  Consultation    Referral Reason:  Specialty Services Required    Requested Specialty:  Urology    Number of Visits Requested:  1  . Ambulatory referral to Dermatology    Referral Priority:  Routine    Referral Type:  Consultation    Referral Reason:  Specialty Services Required    Requested Specialty:  Dermatology    Number of Visits Requested:  1  . POCT urinalysis dipstick  . POC  Hemoccult Bld/Stl (3-Cd Home Screen)    Standing Status: Future     Number of Occurrences:  Standing Expiration Date: 06/14/2016  . EKG 12-Lead   Meds ordered this encounter  Medications  . DISCONTD: lovastatin (MEVACOR) 20 MG tablet    Sig:     Refill:  0  . metFORMIN (GLUCOPHAGE) 500 MG tablet    Sig: TAKE 1 TABLET BY MOUTH TWICE A DAY WITH MEAL    Dispense:  180 tablet    Refill:  1  . lisinopril (PRINIVIL,ZESTRIL) 20 MG tablet    Sig: Take 1 tablet (20 mg total) by mouth daily.    Dispense:  90 tablet    Refill:  1  . hydrochlorothiazide (HYDRODIURIL) 25 MG tablet    Sig: Take 1 tablet (25 mg total) by mouth daily.    Dispense:  90 tablet    Refill:  1  . Lancets MISC    Sig: Test blood sugar daily. Dx code: E11.9    Dispense:  100 each    Refill:  3    ACCU-CHEK FASTCLIX LANCETS  . glucose blood test strip    Sig: Test blood sugar daily. Dx Code: E11.9    Dispense:  100 each    Refill:  3  . clopidogrel (PLAVIX) 75 MG tablet    Sig: Take 1 tablet (75 mg total) by mouth daily.    Dispense:  90 tablet    Refill:  3  . lovastatin (MEVACOR) 20 MG tablet    Sig: Take 1 tablet (20 mg total) by mouth at bedtime.    Dispense:  90 tablet    Refill:  1  . diazepam (VALIUM) 5 MG tablet    Sig: Take 1/2 to 1 po daily as needed with onset of panic attack    Dispense:  20 tablet    Refill:  0    Return in about 6 months (around 12/15/2015) for recheck diabetes.    Hayli Milligan Elayne Guerin, M.D. [facility] [address] [place], [place]  [phone] phone [phone] fax

## 2015-06-15 NOTE — Patient Instructions (Addendum)
   IF you received an x-ray today, you will receive an invoice from [facility]. Please contact [facility] at [phone] with questions or concerns regarding your invoice.   IF you received labwork today, you will receive an invoice from [facility]. Please contact [facility] at [phone] with questions or concerns regarding your invoice.   Our billing staff will not be able to assist you with questions regarding bills from these companies.  You will be contacted with the lab results as soon as they are available. The fastest way to get your results is to activate your My Chart account. Instructions are located on the last page of this paperwork. If you have not heard from us regarding the results in 2 weeks, please contact this office.    Keeping you healthy  Get these tests  Blood pressure- Have your blood pressure checked once a year by your healthcare provider.  Normal blood pressure is 120/80  Weight- Have your body mass index (BMI) calculated to screen for obesity.  BMI is a measure of body fat based on height and weight. You can also calculate your own BMI at [email or web address].com/bmi/.  Cholesterol- Have your cholesterol checked every year.  Diabetes- Have your blood sugar checked regularly if you have high blood pressure, high cholesterol, have a family history of diabetes or if you are overweight.  Screening for Colon Cancer- Colonoscopy starting at age 50.  Screening may begin sooner depending on your family history and other health conditions. Follow up colonoscopy as directed by your Gastroenterologist.  Screening for Prostate Cancer- Both blood work (PSA) and a rectal exam help screen for Prostate Cancer.  Screening begins at age 40 with African-American men and at age 50 with Caucasian men.  Screening may begin sooner depending on your family history.  Take these medicines  Aspirin- One aspirin daily can help prevent Heart  disease and Stroke.  Flu shot- Every fall.  Tetanus- Every 10 years.  Zostavax- Once after the age of 60 to prevent Shingles.  Pneumonia shot- Once after the age of 65; if you are younger than 65, ask your healthcare provider if you need a Pneumonia shot.  Take these steps  Don't smoke- If you do smoke, talk to your doctor about quitting.  For tips on how to quit, go to www.smokefree.gov or call 1-800-QUIT-NOW.  Be physically active- Exercise 5 days a week for at least 30 minutes.  If you are not already physically active start slow and gradually work up to 30 minutes of moderate physical activity.  Examples of moderate activity include walking briskly, mowing the yard, dancing, swimming, bicycling, etc.  Eat a healthy diet- Eat a variety of healthy food such as fruits, vegetables, low fat milk, low fat cheese, yogurt, lean meant, poultry, fish, beans, tofu, etc. For more information go to www.thenutritionsource.org  Drink alcohol in moderation- Limit alcohol intake to less than two drinks a day. Never drink and drive.  Dentist- Brush and floss twice daily; visit your dentist twice a year.  Depression- Your emotional health is as important as your physical health. If you're feeling down, or losing interest in things you would normally enjoy please talk to your healthcare provider.  Eye exam- Visit your eye doctor every year.  Safe sex- If you may be exposed to a sexually transmitted infection, use a condom.  Seat belts- Seat belts can save your life; always wear one.  Smoke/Carbon Monoxide detectors- These detectors need to be installed on   the appropriate level of your home.  Replace batteries at least once a year.  Skin cancer- When out in the sun, cover up and use sunscreen 15 SPF or higher.  Violence- If anyone is threatening you, please tell your healthcare provider.  Living Will/ Health care power of attorney- Speak with your healthcare provider and family. 

## 2015-06-16 LAB — MICROALBUMIN, URINE: Microalb, Ur: 1.6 mg/dL

## 2015-06-16 LAB — TSH: TSH: 0.91 mIU/L (ref 0.40–4.50)

## 2015-06-28 ENCOUNTER — Encounter: Payer: Self-pay | Admitting: Family Medicine

## 2015-06-30 ENCOUNTER — Ambulatory Visit: Payer: Medicare PPO | Admitting: Neurology

## 2015-07-10 ENCOUNTER — Telehealth: Payer: Self-pay

## 2015-07-10 NOTE — Telephone Encounter (Signed)
PT WOULD LIKE REFILLS FOR ALL RX ORIGINALLY ORDERED 06/16/2015   [id number] [facility] [address] Todd Diaz  [phone] PLEASE CALL WHEN READY FOR PICK UP

## 2015-07-12 NOTE — Telephone Encounter (Signed)
LMOM for pt advising that the best way to have them sent to another pharm is to have one transfer them from the one where they were sent. This will keep things the straightest for the ins company to cover them. Asked pt to call back with more details if he needs them printed instead, or having trouble with transfer.

## 2015-08-11 DIAGNOSIS — N401 Enlarged prostate with lower urinary tract symptoms: Principal | ICD-10-CM

## 2015-08-11 DIAGNOSIS — N138 Other obstructive and reflux uropathy: Secondary | ICD-10-CM | POA: Insufficient documentation

## 2015-08-11 DIAGNOSIS — R339 Retention of urine, unspecified: Secondary | ICD-10-CM | POA: Insufficient documentation

## 2015-08-11 DIAGNOSIS — N471 Phimosis: Secondary | ICD-10-CM | POA: Insufficient documentation

## 2015-09-09 ENCOUNTER — Other Ambulatory Visit
Admission: RE | Admit: 2015-09-09 | Discharge: 2015-09-09 | Disposition: A | Discharge status: Home / Self Care | Payer: Medicare PPO | Source: Ambulatory Visit | Attending: Internal Medicine | Admitting: Internal Medicine

## 2015-09-09 DIAGNOSIS — I5032 Chronic diastolic (congestive) heart failure: Secondary | ICD-10-CM | POA: Diagnosis present

## 2015-09-09 LAB — COMPREHENSIVE METABOLIC PANEL
ALT: 19 U/L (ref 17–63)
AST: 23 U/L (ref 15–41)
Albumin: 4.2 g/dL (ref 3.5–5.0)
Alkaline Phosphatase: 71 U/L (ref 38–126)
Anion gap: 8 (ref 5–15)
BUN: 43 mg/dL — ABNORMAL HIGH (ref 6–20)
CO2: 27 mmol/L (ref 22–32)
Calcium: 8.7 mg/dL — ABNORMAL LOW (ref 8.9–10.3)
Chloride: 99 mmol/L — ABNORMAL LOW (ref 101–111)
Creatinine, Ser: 1.33 mg/dL — ABNORMAL HIGH (ref 0.61–1.24)
GFR calc Af Amer: 58 mL/min — ABNORMAL LOW (ref >60)
GFR calc non Af Amer: 50 mL/min — ABNORMAL LOW (ref >60)
Glucose, Bld: 186 mg/dL — ABNORMAL HIGH (ref 65–99)
Potassium: 5.1 mmol/L (ref 3.5–5.1)
Sodium: 134 mmol/L — ABNORMAL LOW (ref 135–145)
Total Bilirubin: 0.7 mg/dL (ref 0.3–1.2)
Total Protein: 7.4 g/dL (ref 6.5–8.1)

## 2015-09-09 LAB — MAGNESIUM: Magnesium: 2 mg/dL (ref 1.7–2.4)

## 2015-09-09 LAB — PROTIME-INR
INR: 1.03
Prothrombin Time: 13.5 seconds (ref 11.4–15.2)

## 2015-09-10 ENCOUNTER — Telehealth (HOSPITAL_COMMUNITY): Payer: Self-pay | Admitting: *Deleted

## 2015-09-10 DIAGNOSIS — I5022 Chronic systolic (congestive) heart failure: Secondary | ICD-10-CM

## 2015-09-10 LAB — CBC
HCT: 35 % — ABNORMAL LOW (ref 40.0–52.0)
Hemoglobin: 11.8 g/dL — ABNORMAL LOW (ref 13.0–18.0)
MCH: 31.5 pg (ref 26.0–34.0)
MCHC: 33.8 g/dL (ref 32.0–36.0)
MCV: 93.2 fL (ref 80.0–100.0)
Platelets: 110 10*3/uL — ABNORMAL LOW (ref 150–440)
RBC: 3.76 MIL/uL — ABNORMAL LOW (ref 4.40–5.90)
RDW: 15.5 % — ABNORMAL HIGH (ref 11.5–14.5)
WBC: 6.1 10*3/uL (ref 3.8–10.6)

## 2015-09-10 NOTE — Telephone Encounter (Signed)
Notes Recorded by Harvie Junior, CMA on 09/10/2015 at 1:33 PM EDT Patient aware. Patient stated he is only taking hydrochlorothiazide. Added on lab schedule for 9/11  ------  Notes Recorded by Jolaine Artist, MD on 09/09/2015 at 11:26 PM EDT He is dry. What diuretic is he taking now? Repeat 1 week    Ref Range & Units 1d ago 30mo ago 24mo ago   Sodium 135 - 145 mmol/L 134  139R 139R   Potassium 3.5 - 5.1 mmol/L 5.1 4.0R 4.3R   Chloride 101 - 111 mmol/L 99  101R 98R   CO2 22 - 32 mmol/L 27 26R 29R   Glucose, Bld 65 - 99 mg/dL 186  111  107    BUN 6 - 20 mg/dL 43  12R 10R   Creatinine, Ser 0.61 - 1.24 mg/dL 1.33  0.81R 0.78R   Calcium 8.9 - 10.3 mg/dL 8.7  10.1R 9.7R   Total Protein 6.5 - 8.1 g/dL 7.4 7.1R 7.2R   Albumin 3.5 - 5.0 g/dL 4.2 4.3R 4.2R   AST 15 - 41 U/L 23 33R 29R   ALT 17 - 63 U/L 19 33R 32R   Alkaline Phosphatase 38 - 126 U/L 71 75R 83R   Total Bilirubin 0.3 - 1.2 mg/dL 0.7 0.4R 0.4R   GFR calc non Af Amer >60 mL/min 50      GFR calc Af Amer >60 mL/min 58

## 2015-09-20 ENCOUNTER — Other Ambulatory Visit (HOSPITAL_COMMUNITY): Payer: Medicare PPO

## 2015-10-07 DIAGNOSIS — D303 Benign neoplasm of bladder: Secondary | ICD-10-CM | POA: Insufficient documentation

## 2015-10-07 DIAGNOSIS — R39198 Other difficulties with micturition: Secondary | ICD-10-CM | POA: Insufficient documentation

## 2015-10-08 ENCOUNTER — Telehealth: Payer: Self-pay

## 2015-10-08 DIAGNOSIS — Z1211 Encounter for screening for malignant neoplasm of colon: Secondary | ICD-10-CM

## 2015-10-08 NOTE — Telephone Encounter (Signed)
Pt is needing to get a referral for a colonoscopy   Best number [phone]

## 2015-10-11 ENCOUNTER — Ambulatory Visit: Payer: Medicare PPO | Admitting: Neurology

## 2015-10-11 NOTE — Telephone Encounter (Signed)
Referral to GI for colonoscopy placed

## 2015-10-11 NOTE — Telephone Encounter (Signed)
Patient states he was referred [facility] Jacqlyn Larsen Urologist for prostate concerns and exam was ok. Advised to ask PCP for a colonoscopy referral as the next step. No new concerns or symptoms.Last seen 06/2015 Please advise and place referral as appropriate.

## 2015-11-15 ENCOUNTER — Encounter: Payer: Self-pay | Admitting: Gastroenterology

## 2015-12-21 ENCOUNTER — Ambulatory Visit: Payer: Medicare PPO | Admitting: Family Medicine

## 2016-01-04 ENCOUNTER — Other Ambulatory Visit: Payer: Self-pay | Admitting: Family Medicine

## 2016-01-13 ENCOUNTER — Ambulatory Visit: Payer: Medicare PPO | Admitting: Neurology

## 2016-01-17 ENCOUNTER — Other Ambulatory Visit: Payer: Self-pay

## 2016-01-17 ENCOUNTER — Ambulatory Visit: Payer: Medicare PPO | Admitting: Gastroenterology

## 2016-02-03 ENCOUNTER — Encounter: Payer: Self-pay | Admitting: Neurology

## 2016-02-03 ENCOUNTER — Ambulatory Visit (INDEPENDENT_AMBULATORY_CARE_PROVIDER_SITE_OTHER): Payer: Medicare PPO | Admitting: Neurology

## 2016-02-03 ENCOUNTER — Other Ambulatory Visit: Payer: Self-pay | Admitting: Family Medicine

## 2016-02-03 VITALS — BP 142/84 | HR 61 | Ht 70.25 in | Wt 202.4 lb

## 2016-02-03 DIAGNOSIS — E118 Type 2 diabetes mellitus with unspecified complications: Secondary | ICD-10-CM

## 2016-02-03 DIAGNOSIS — E785 Hyperlipidemia, unspecified: Secondary | ICD-10-CM

## 2016-02-03 DIAGNOSIS — G459 Transient cerebral ischemic attack, unspecified: Secondary | ICD-10-CM

## 2016-02-03 DIAGNOSIS — I639 Cerebral infarction, unspecified: Secondary | ICD-10-CM

## 2016-02-03 DIAGNOSIS — M539 Dorsopathy, unspecified: Secondary | ICD-10-CM

## 2016-02-03 DIAGNOSIS — I10 Essential (primary) hypertension: Secondary | ICD-10-CM

## 2016-02-03 DIAGNOSIS — M5386 Other specified dorsopathies, lumbar region: Secondary | ICD-10-CM

## 2016-02-03 NOTE — Patient Instructions (Signed)
1.  Continue aspirin and Plavix 2.  Continue your cholesterol medication 3.  Continue blood pressure and diabetes control 4.  We will recheck a carotid doppler 5.  Follow up in one year.

## 2016-02-03 NOTE — Progress Notes (Signed)
NEUROLOGY FOLLOW UP OFFICE NOTE  ARCH METHOT [id number]  HISTORY OF PRESENT ILLNESS: Todd Diaz is a 76 year old right-handed man with hypertension, hyperlipidemia, type 2 diabetes and history of panic attacks who follows up for TIA and right sciatica/radiculopathy.    UPDATE: He is on Plavix and lovastatin 51m daily.  Repeat lipid panel from 06/15/15 showed LDL of 68 and Hgb A1c 6.3.  For sciatica/lumbosacral radiculopathy, he was sent for PT.  It has improved..Marland Kitchen   HISTORY: [facility] onset of right arm and leg numbness and weakness while watching TV.  MRI of brain revealed punctate acute left frontoparietal infarcts.  Symptoms resolved by the next morning.  Carotid doppler showed bilateral atherosclerotic plaque, slightly worse in the left ICA compared to prior doppler from 2014.  He was already on ASA 820mdaily which was unchanged.  I don't have any records from this visit.  [facility] with right upper and lower extremity weakness with paresthesias.  He was out of the window for tPA.  CT of head was negative.  Symptoms lasted about 2 hours.  His ASA was increased to 32551mnd discharged from the ED with instructions for neurology follow-up.  He reportedly had another carotid doppler which was normal.  Lipid panel from 03/25/14 showed cholesterol 186, HDL 30 and LDL 93.  Hgb A1c was 6.4 with mean plasma glucose of 137.  2D echo from 08/25/14 showed LV EF 55-60%.  Labs from 01/11/15 include Hgb A1c 6.3%   He has history of numbness in the back of his right leg from the thigh down to the foot.  It only occurs when he is sitting in certain chairs or while driving.  There is no associated weakness, pain or back pain, although he has a history of back pain.  It resolves when he stands up and walks.  Lumbar Xray from January 2017 revealed degenerative disc disease at L4-5 and L5-S1.  PAST MEDICAL HISTORY: Past Medical History:    Diagnosis Date  . Arthritis    Knees  . Avascular necrosis of medial [place] condyle ([place] . Chronic prostatitis   . Diabetes mellitus ([place]  controlled with medication  . Erectile dysfunction   . Hearing loss   . Hyperlipidemia   . Hypertension    controlled with medication  . Hypertrophy of prostate without urinary obstruction and other lower urinary tract symptoms (LUTS)   . Infection of urinary tract    ASSESSMENT:Recurrent MRSA UTI F/B Dr. BloClayborn Bignessd Dr. CopJacqlyn Larsen TIA (transient ischemic attack) 02/23/2014   R sided weakness and numbness; duration 1 hour; evaluated [facility].  . Tobacco use disorder     MEDICATIONS: Current Outpatient Prescriptions on File Prior to Visit  Medication Sig Dispense Refill  . aspirin 325 MG tablet Take 325 mg by mouth daily.    . Blood Glucose Calibration (ACCU-CHEK COMPACT PLUS CONTROL) SOLN Test blood sugar daily. Dx code: E11.9 1 each 0  . Blood Glucose Monitoring Suppl (BLOOD GLUCOSE METER KIT AND SUPPLIES) KIT Test blood sugar daily. Dx code: E11.9 1 each 0  . clopidogrel (PLAVIX) 75 MG tablet Take 1 tablet (75 mg total) by mouth daily. 90 tablet 3  . diazepam (VALIUM) 5 MG tablet Take 1/2 to 1 po daily as needed with onset of panic attack 20 tablet 0  . glucose blood test strip Test blood sugar daily. Dx  Code: E11.9 100 each 3  . hydrochlorothiazide (HYDRODIURIL) 25 MG tablet Take 1 tablet (25 mg total) by mouth daily. 90 tablet 1  . Lancets MISC Test blood sugar daily. Dx code: E11.9 100 each 3  . lovastatin (MEVACOR) 20 MG tablet Take 1 tablet (20 mg total) by mouth at bedtime. 90 tablet 1  . sildenafil (VIAGRA) 100 MG tablet as directed.    Marland Kitchen VIAGRA 100 MG tablet take 1 tablet by mouth once daily if needed 8 tablet 0   No current facility-administered medications on file prior to visit.     ALLERGIES: No Known Allergies  FAMILY HISTORY: Family History  Problem Relation Age of Onset  . Hypertension Mother   . Cancer Mother      lung  . Heart attack Father   . Hypertension Father   . Hyperlipidemia Father   . Aneurysm Father 73    Brain  . Heart disease Father 77    AMI as cause of death  . Diabetes Son     SOCIAL HISTORY: Social History   Social History  . Marital status: Widowed    Spouse name: N/A  . Number of children: 4  . Years of education: 12   Occupational History  . 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 Smoking status: Former Smoker    Packs/day: 0.50    Years: 20.00    Types: Cigarettes    Quit date: 01/09/1986  . Smokeless tobacco: Current User    Types: Snuff     Comment: chews about 1/2 a pack of tobacco currently;   . Alcohol use 0.0 oz/week     Comment: occasional 12 beers per weekend night.  DUI in 2000.  . Drug use: No  . Sexual activity: Yes    Partners: Female    Birth control/ protection: None, Post-menopausal   Other Topics Concern  . Not on file   Social History Narrative   Widowed since 02/2009 after 49 years second marriage; engaged in 01/2011.        Children: 6 children, 2 stepchildren; 8 grandchildren, 2 gg.      Lives: with fiance, cat, dog.      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      Tobacco: quit smoking age 69; smoked x 22 years.      Alcohol:  Beer every weekend (12 pack-24 pack).      Seatbelt:  Always uses seat belts, Smoke alarm in the home,      Guns: >5 guns in the home loaded and locked up.      Caffeine SAY:TKZSWF, Tea, Carbonated beverages; consumes a moderate amount.      Exercise: Inactive.     Advanced Directives:  +LIVING WILL; FULL CODE, No HCPOA.      ADLs: independent with ADLs.  Drives.    REVIEW OF SYSTEMS: Constitutional: No fevers, chills, or sweats, no generalized fatigue, change in appetite Eyes: No visual changes, double vision, eye pain Ear, nose and throat: No hearing loss, ear pain, nasal congestion, sore throat Cardiovascular: No chest pain,  palpitations Respiratory:  No shortness of breath at rest or with exertion, wheezes GastrointestinaI: No nausea, vomiting, diarrhea, abdominal pain, fecal incontinence Genitourinary:  No dysuria, urinary retention or frequency Musculoskeletal:  No neck pain, back pain Integumentary: No rash, pruritus, skin lesions Neurological: as above Psychiatric: No depression, insomnia, anxiety Endocrine: No  palpitations, fatigue, diaphoresis, mood swings, change in appetite, change in weight, increased thirst Hematologic/Lymphatic:  No purpura, petechiae. Allergic/Immunologic: no itchy/runny eyes, nasal congestion, recent allergic reactions, rashes  PHYSICAL EXAM: Vitals:   02/03/16 1020  BP: (!) 142/84  Pulse: 61   General: No acute distress.  Patient appears well-groomed.  normal body habitus. Head:  Normocephalic/atraumatic Eyes:  Fundi examined but not visualized Neck: supple, no paraspinal tenderness, full range of motion Heart:  Regular rate and rhythm Lungs:  Clear to auscultation bilaterally Back: No paraspinal tenderness Neurological Exam: alert and oriented to person, place, and time. Attention span and concentration intact, recent and remote memory intact, fund of knowledge intact.  Speech fluent and not dysarthric, language intact.  CN II-XII intact. Bulk and tone normal, muscle strength 5/5 throughout.  Sensation to light touch  intact.  Deep tendon reflexes 2+ throughout.  Finger to nose testing intact.  Gait normal  IMPRESSION: CVA Right sciatica or S1 radiculopathy Essential hypertension. Type 2 diabetes hyperlipidemia  PLAN: 1.  Continue ASA and Plavix for secondary stroke prevention 2.  Continue statin therapy (LDL at goal of less than 70) 3.  Continue blood pressure and glycemic control 4.  Repeat carotid doppler 5.  Follow up in one year.  26 minutes spent face to face with patient, over 50% spent discussing labs and further testing.  Metta Clines, DO  CC: Reginia Forts, MD

## 2016-02-04 ENCOUNTER — Other Ambulatory Visit: Payer: Self-pay | Admitting: Neurology

## 2016-02-04 DIAGNOSIS — G459 Transient cerebral ischemic attack, unspecified: Secondary | ICD-10-CM

## 2016-02-05 ENCOUNTER — Ambulatory Visit (INDEPENDENT_AMBULATORY_CARE_PROVIDER_SITE_OTHER): Payer: Medicare PPO | Admitting: Family Medicine

## 2016-02-05 VITALS — BP 128/82 | HR 83 | Temp 97.5°F | Resp 18 | Ht 70.0 in | Wt 201.2 lb

## 2016-02-05 DIAGNOSIS — Z8673 Personal history of transient ischemic attack (TIA), and cerebral infarction without residual deficits: Secondary | ICD-10-CM | POA: Diagnosis not present

## 2016-02-05 DIAGNOSIS — Z6828 Body mass index (BMI) 28.0-28.9, adult: Secondary | ICD-10-CM | POA: Diagnosis not present

## 2016-02-05 DIAGNOSIS — F4320 Adjustment disorder, unspecified: Secondary | ICD-10-CM | POA: Diagnosis not present

## 2016-02-05 DIAGNOSIS — E119 Type 2 diabetes mellitus without complications: Secondary | ICD-10-CM | POA: Diagnosis not present

## 2016-02-05 DIAGNOSIS — E7800 Pure hypercholesterolemia, unspecified: Secondary | ICD-10-CM | POA: Diagnosis not present

## 2016-02-05 DIAGNOSIS — I10 Essential (primary) hypertension: Secondary | ICD-10-CM | POA: Diagnosis not present

## 2016-02-05 DIAGNOSIS — Z23 Encounter for immunization: Secondary | ICD-10-CM | POA: Diagnosis not present

## 2016-02-05 DIAGNOSIS — F4321 Adjustment disorder with depressed mood: Secondary | ICD-10-CM

## 2016-02-05 LAB — POCT URINALYSIS DIP (MANUAL ENTRY)
Bilirubin, UA: NEGATIVE
Blood, UA: NEGATIVE
Glucose, UA: NEGATIVE
Ketones, POC UA: NEGATIVE
Nitrite, UA: NEGATIVE
Protein Ur, POC: NEGATIVE
Spec Grav, UA: 1.015
Urobilinogen, UA: 0.2
pH, UA: 6

## 2016-02-05 LAB — POCT GLYCOSYLATED HEMOGLOBIN (HGB A1C): Hemoglobin A1C: 6.1

## 2016-02-05 MED ORDER — LANCETS MISC: 3 refills | Status: AC

## 2016-02-05 MED ORDER — LISINOPRIL 20 MG PO TABS: 20.0000 mg | ORAL_TABLET | Freq: Every day | ORAL | 1 refills | Status: DC

## 2016-02-05 MED ORDER — LOVASTATIN 20 MG PO TABS: 20.0000 mg | ORAL_TABLET | Freq: Every day | ORAL | 1 refills | Status: DC

## 2016-02-05 MED ORDER — GLUCOSE BLOOD VI STRP: ORAL_STRIP | 3 refills | Status: AC

## 2016-02-05 MED ORDER — METFORMIN HCL 500 MG PO TABS: 500.0000 mg | ORAL_TABLET | Freq: Two times a day (BID) | ORAL | 1 refills | Status: DC

## 2016-02-05 MED ORDER — DIAZEPAM 5 MG PO TABS: ORAL_TABLET | ORAL | 0 refills | Status: DC

## 2016-02-05 MED ORDER — HYDROCHLOROTHIAZIDE 25 MG PO TABS: 25.0000 mg | ORAL_TABLET | Freq: Every day | ORAL | 1 refills | Status: DC

## 2016-02-05 NOTE — Progress Notes (Signed)
U

## 2016-02-05 NOTE — Patient Instructions (Signed)
     IF you received an x-ray today, you will receive an invoice from [facility]. Please contact  Radiology at [phone] with questions or concerns regarding your invoice.   IF you received labwork today, you will receive an invoice from [facility]. Please contact [facility] at [phone] with questions or concerns regarding your invoice.   Our billing staff will not be able to assist you with questions regarding bills from these companies.  You will be contacted with the lab results as soon as they are available. The fastest way to get your results is to activate your My Chart account. Instructions are located on the last page of this paperwork. If you have not heard from us regarding the results in 2 weeks, please contact this office.     

## 2016-02-05 NOTE — Progress Notes (Signed)
Subjective:    Patient ID: Todd Diaz, male    DOB: 11-04-1940, 76 y.o.   MRN: [id number]  02/05/2016  Follow-up (6 mth f/u) and Flu Vaccine   HPI This 76 y.o. male presents for nine month follow-up of DMII, hypertension, hypercholesterolemia. PT is grieving the loss of his wife who died in 11-Jan-2017 while in ICU at [facility].  Doing well yet misses her daily.  Requesting refill of Valium; has had to use more often.  Denies SI. Has good family support; continuing to work.  Holiday time was difficult. Requsting rx for new meter. Not checking blood sugar or blood pressure at home.    Immunization History  Administered Date(s) Administered  . DTaP 03/10/2011  . Influenza Split 09/10/2010  . Influenza,inj,Quad PF,36+ Mos 12/11/2012, 10/13/2013, 01/11/2015, 02/05/2016  . Pneumococcal Conjugate-13 10/13/2013  . Pneumococcal-Unspecified 03/10/2011   BP Readings from Last 3 Encounters:  02/05/16 128/82  02/03/16 (!) 142/84  06/15/15 126/73   Wt Readings from Last 3 Encounters:  02/05/16 201 lb 4 oz (91.3 kg)  02/03/16 202 lb 6.4 oz (91.8 kg)  06/15/15 204 lb 6.4 oz (92.7 kg)    Review of Systems  Constitutional: Negative for activity change, appetite change, chills, diaphoresis, fatigue and fever.  Respiratory: Negative for cough and shortness of breath.   Cardiovascular: Negative for chest pain, palpitations and leg swelling.  Gastrointestinal: Negative for abdominal pain, diarrhea, nausea and vomiting.  Endocrine: Negative for cold intolerance, heat intolerance, polydipsia, polyphagia and polyuria.  Skin: Negative for color change, rash and wound.  Neurological: Negative for dizziness, tremors, seizures, syncope, facial asymmetry, speech difficulty, weakness, light-headedness, numbness and headaches.  Psychiatric/Behavioral: Positive for dysphoric mood and sleep disturbance. Negative for self-injury and suicidal ideas. The patient is not nervous/anxious.     Past Medical  History:  Diagnosis Date  . Arthritis    Knees  . Avascular necrosis of medial femoral condyle ([place])   . Chronic prostatitis   . Diabetes mellitus ([place])    controlled with medication  . Erectile dysfunction   . Hearing loss   . Hyperlipidemia   . Hypertension    controlled with medication  . Hypertrophy of prostate without urinary obstruction and other lower urinary tract symptoms (LUTS)   . Infection of urinary tract    ASSESSMENT:Recurrent MRSA UTI F/B Dr. Clayborn Bigness and Dr. Jacqlyn Larsen  . TIA (transient ischemic attack) 02/23/2014   R sided weakness and numbness; duration 1 hour; evaluated [facility].  . Tobacco use disorder    Past Surgical History:  Procedure Laterality Date  . Admission  03/09/2012   [facility]; 24 hour admission; LE numbness/tingling.  CT head, MRI brain, carotid dopplers, 2D-echo.  . AMPUTATION  2009   L fifth finger surgery for near amputation. [place].  Marland Kitchen BLADDER STONE REMOVAL    . EYE SURGERY  08/09/2013   B cataract surgery; [place]  . HERNIA REPAIR  04/09/2013   3 repaired; [place].  . JOINT REPLACEMENT    . PARTIAL KNEE ARTHROPLASTY  07/24/2011   Procedure: UNICOMPARTMENTAL KNEE;  Surgeon: Lorn Junes, MD;  Location: [facility];  Service: Orthopedics;  Laterality: Left;  . PROSTATE SURGERY    . RETINAL TEAR REPAIR CRYOTHERAPY  08/09/2013   [name]   No Known Allergies  Social History   Social History  . Marital status: Widowed    Spouse name: N/A  . Number of children: 4  . Years of education: 12   Occupational History  . Retired   .  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 Smoking status: Former Smoker    Packs/day: 0.50    Years: 20.00    Types: Cigarettes    Quit date: 01/09/1986  . Smokeless tobacco: Current User    Types: Snuff     Comment: chews about 1/2 a pack of tobacco currently;   . Alcohol use 0.0 oz/week     Comment: occasional 12 beers per weekend night.  DUI in 2000.  . Drug use: No  . Sexual activity: Yes     Partners: Female    Birth control/ protection: None, Post-menopausal   Other Topics Concern  . Not on file   Social History Narrative   Widowed since 02/2009 after 79 years second marriage; engaged in 01/2011.        Children: 6 children, 2 stepchildren; 8 grandchildren, 2 gg.      Lives: with fiance, cat, dog.      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      Tobacco: quit smoking age 55; smoked x 22 years.      Alcohol:  Beer every weekend (12 pack-24 pack).      Seatbelt:  Always uses seat belts, Smoke alarm in the home,      Guns: >5 guns in the home loaded and locked up.      Caffeine PRX:YVOPFY, Tea, Carbonated beverages; consumes a moderate amount.      Exercise: Inactive.     Advanced Directives:  +LIVING WILL; FULL CODE, No HCPOA.      ADLs: independent with ADLs.  Drives.   Family History  Problem Relation Age of Onset  . Hypertension Mother   . Cancer Mother     lung  . Heart attack Father   . Hypertension Father   . Hyperlipidemia Father   . Aneurysm Father 18    Brain  . Heart disease Father 27    AMI as cause of death  . Diabetes Son        Objective:    BP 128/82   Pulse 83   Temp 97.5 F (36.4 C) (Oral)   Resp 18   Ht _0  (1.778 m)   Wt 201 lb 4 oz (91.3 kg)   SpO2 95%   BMI 28.88 kg/m  Physical Exam  Constitutional: He is oriented to person, place, and time. He appears well-developed and well-nourished. No distress.  HENT:  Head: Normocephalic and atraumatic.  Right Ear: External ear normal.  Left Ear: External ear normal.  Nose: Nose normal.  Mouth/Throat: Oropharynx is clear and moist.  Eyes: Conjunctivae and EOM are normal. Pupils are equal, round, and reactive to light.  Neck: Normal range of motion. Neck supple. Carotid bruit is not present. No thyromegaly present.  Cardiovascular: Normal rate, regular rhythm, normal heart sounds and intact distal pulses.  Exam reveals no gallop and no friction rub.   No murmur  heard. Pulmonary/Chest: Effort normal and breath sounds normal. He has no wheezes. He has no rales.  Abdominal: Soft. Bowel sounds are normal. He exhibits no distension and no mass. There is no tenderness. There is no rebound and no guarding.  Lymphadenopathy:    He has no cervical adenopathy.  Neurological: He is alert and oriented to person, place, and time. No cranial nerve deficit.  Skin: Skin is warm and dry. No rash noted. He is not diaphoretic.  Psychiatric: He has a normal  mood and affect. His behavior is normal.  Nursing note and vitals reviewed.   Depression screen [facility] 2/9 06/15/2015 01/11/2015 03/25/2014 10/13/2013 03/25/2012  Decreased Interest 0 0 0 0 0  Down, Depressed, Hopeless 0 0 0 0 0  PHQ - 2 Score 0 0 0 0 0   Fall Risk  02/03/2016 06/15/2015 02/19/2015 01/11/2015 03/25/2014  Falls in the past year? _0        Assessment & Plan:   1. Type 2 diabetes mellitus without complication, without long-term current use of insulin ([place])   2. Essential hypertension, benign   3. Pure hypercholesterolemia   4. History of TIA (transient ischemic attack)   5. Need for prophylactic vaccination and inoculation against influenza   6. Grief reaction   7. BMI 28.0-28.9,adult    -stable; obtain labs; refills provided. -counseling provided during visit due to death of wife; coping well; refill of Valium provided.   Orders Placed This Encounter  Procedures  . Flu Vaccine QUAD 36+ mos IM  . CBC with Differential/Platelet  . Comprehensive metabolic panel    Order Specific Question:   Has the patient fasted?    Answer:   Yes  . Lipid panel    Order Specific Question:   Has the patient fasted?    Answer:   Yes  . POCT glycosylated hemoglobin (Hb A1C)  . POCT urinalysis dipstick   Meds ordered this encounter  Medications  . diazepam (VALIUM) 5 MG tablet    Sig: Take 1/2 to 1 po daily as needed with onset of panic attack    Dispense:  20 tablet    Refill:  0  .  hydrochlorothiazide (HYDRODIURIL) 25 MG tablet    Sig: Take 1 tablet (25 mg total) by mouth daily.    Dispense:  90 tablet    Refill:  1  . Lancets MISC    Sig: Test blood sugar daily. Dx code: E11.9    Dispense:  100 each    Refill:  3    ACCU-CHEK FASTCLIX LANCETS  . glucose blood test strip    Sig: Test blood sugar daily. Dx Code: E11.9    Dispense:  100 each    Refill:  3  . lisinopril (PRINIVIL,ZESTRIL) 20 MG tablet    Sig: Take 1 tablet (20 mg total) by mouth daily.    Dispense:  90 tablet    Refill:  1    PATIENT NEEDS OFFICE VISIT FOR ADDITIONAL REFILLS  . lovastatin (MEVACOR) 20 MG tablet    Sig: Take 1 tablet (20 mg total) by mouth at bedtime.    Dispense:  90 tablet    Refill:  1  . metFORMIN (GLUCOPHAGE) 500 MG tablet    Sig: Take 1 tablet (500 mg total) by mouth 2 (two) times daily with a meal.    Dispense:  180 tablet    Refill:  1    PATIENT NEEDS OFFICE VISIT FOR ADDITIONAL REFILLS  . blood glucose meter kit and supplies KIT    Sig: Dispense based on patient and insurance preference. Check sugar once daily as directed. (FOR ICD-9 250.00, 250.01).    Dispense:  1 each    Refill:  0    Order Specific Question:   Number of strips    Answer:   100    Order Specific Question:   Number of lancets    Answer:   100    Return in about 4 months (around 06/04/2016) for  complete physical examiniation.   Luan Maberry Elayne Guerin, M.D. Primary Care at [facility] previously [facility] [address] [place], [place]  [address] [phone] phone [phone] fax

## 2016-02-06 LAB — CBC WITH DIFFERENTIAL/PLATELET
Basophils Absolute: 0 10*3/uL (ref 0.0–0.2)
Basos: 1 %
EOS (ABSOLUTE): 0.3 10*3/uL (ref 0.0–0.4)
Eos: 6 %
Hematocrit: 48.8 % (ref 37.5–51.0)
Hemoglobin: 16.4 g/dL (ref 13.0–17.7)
Immature Grans (Abs): 0 10*3/uL (ref 0.0–0.1)
Immature Granulocytes: 0 %
Lymphocytes Absolute: 1.5 10*3/uL (ref 0.7–3.1)
Lymphs: 24 %
MCH: 29.8 pg (ref 26.6–33.0)
MCHC: 33.6 g/dL (ref 31.5–35.7)
MCV: 89 fL (ref 79–97)
Monocytes Absolute: 0.6 10*3/uL (ref 0.1–0.9)
Monocytes: 9 %
Neutrophils Absolute: 3.8 10*3/uL (ref 1.4–7.0)
Neutrophils: 60 %
Platelets: 216 10*3/uL (ref 150–379)
RBC: 5.51 x10E6/uL (ref 4.14–5.80)
RDW: 14.9 % (ref 12.3–15.4)
WBC: 6.2 10*3/uL (ref 3.4–10.8)

## 2016-02-06 LAB — COMPREHENSIVE METABOLIC PANEL
ALT: 31 IU/L (ref 0–44)
AST: 33 IU/L (ref 0–40)
Albumin/Globulin Ratio: 1.5 (ref 1.2–2.2)
Albumin: 4.5 g/dL (ref 3.5–4.8)
Alkaline Phosphatase: 82 IU/L (ref 39–117)
BUN/Creatinine Ratio: 12 (ref 10–24)
BUN: 10 mg/dL (ref 8–27)
Bilirubin Total: 0.4 mg/dL (ref 0.0–1.2)
CO2: 25 mmol/L (ref 18–29)
Calcium: 10.1 mg/dL (ref 8.6–10.2)
Chloride: 98 mmol/L (ref 96–106)
Creatinine, Ser: 0.81 mg/dL (ref 0.76–1.27)
GFR calc Af Amer: 99 mL/min/{1.73_m2} (ref >59)
GFR calc non Af Amer: 86 mL/min/{1.73_m2} (ref >59)
Globulin, Total: 3.1 g/dL (ref 1.5–4.5)
Glucose: 80 mg/dL (ref 65–99)
Potassium: 4.4 mmol/L (ref 3.5–5.2)
Sodium: 141 mmol/L (ref 134–144)
Total Protein: 7.6 g/dL (ref 6.0–8.5)

## 2016-02-06 LAB — LIPID PANEL
Chol/HDL Ratio: 5.2 ratio units — ABNORMAL HIGH (ref 0.0–5.0)
Cholesterol, Total: 178 mg/dL (ref 100–199)
HDL: 34 mg/dL — ABNORMAL LOW (ref >39)
LDL Calculated: 88 mg/dL (ref 0–99)
Triglycerides: 282 mg/dL — ABNORMAL HIGH (ref 0–149)
VLDL Cholesterol Cal: 56 mg/dL — ABNORMAL HIGH (ref 5–40)

## 2016-02-07 MED ORDER — BLOOD GLUCOSE MONITOR KIT: PACK | 0 refills | Status: AC

## 2016-02-11 ENCOUNTER — Ambulatory Visit (HOSPITAL_COMMUNITY)
Admission: RE | Admit: 2016-02-11 | Discharge: 2016-02-11 | Disposition: A | Discharge status: Home / Self Care | Payer: Medicare PPO | Source: Ambulatory Visit | Attending: Cardiology | Admitting: Cardiology

## 2016-02-11 DIAGNOSIS — G459 Transient cerebral ischemic attack, unspecified: Secondary | ICD-10-CM

## 2016-02-11 DIAGNOSIS — I6523 Occlusion and stenosis of bilateral carotid arteries: Secondary | ICD-10-CM | POA: Diagnosis not present

## 2016-02-14 ENCOUNTER — Telehealth: Payer: Self-pay

## 2016-02-14 NOTE — Telephone Encounter (Signed)
Called patient. Gave carotid doppler results. Patient verbalized understanding.

## 2016-02-14 NOTE — Telephone Encounter (Signed)
-----   Message from Pieter Partridge, DO sent at 02/14/2016  6:53 AM EST ----- Carotid doppler looks okay

## 2016-02-16 ENCOUNTER — Ambulatory Visit (INDEPENDENT_AMBULATORY_CARE_PROVIDER_SITE_OTHER): Payer: Medicare PPO | Admitting: Physician Assistant

## 2016-02-16 VITALS — BP 122/80 | HR 87 | Temp 98.3°F | Resp 16 | Ht 70.0 in | Wt 204.0 lb

## 2016-02-16 DIAGNOSIS — R69 Illness, unspecified: Secondary | ICD-10-CM | POA: Diagnosis not present

## 2016-02-16 DIAGNOSIS — R059 Cough, unspecified: Secondary | ICD-10-CM

## 2016-02-16 DIAGNOSIS — R05 Cough: Secondary | ICD-10-CM | POA: Diagnosis not present

## 2016-02-16 DIAGNOSIS — R0981 Nasal congestion: Secondary | ICD-10-CM | POA: Diagnosis not present

## 2016-02-16 DIAGNOSIS — J111 Influenza due to unidentified influenza virus with other respiratory manifestations: Secondary | ICD-10-CM

## 2016-02-16 MED ORDER — FLUTICASONE PROPIONATE 50 MCG/ACT NA SUSP: 2.0000 | Freq: Every day | NASAL | 0 refills | Status: DC

## 2016-02-16 MED ORDER — BENZONATATE 100 MG PO CAPS: 100.0000 mg | ORAL_CAPSULE | Freq: Three times a day (TID) | ORAL | 0 refills | Status: DC | PRN

## 2016-02-16 MED ORDER — PSEUDOEPHEDRINE HCL ER 120 MG PO TB12: 120.0000 mg | ORAL_TABLET | Freq: Two times a day (BID) | ORAL | 0 refills | Status: DC

## 2016-02-16 MED ORDER — OSELTAMIVIR PHOSPHATE 75 MG PO CAPS: 75.0000 mg | ORAL_CAPSULE | Freq: Two times a day (BID) | ORAL | 0 refills | Status: DC

## 2016-02-16 NOTE — Progress Notes (Signed)
MRN: [id number] DOB: 1940-04-22  Subjective:   Todd Diaz is a 76 y.o. male presenting for chief complaint of Cough (x 1 day); Fever; and Headache . Reports 2 day history of sudden onset sinus headache, rhinorrhea, dry cough (no hemoptysis) and myalgia, subjective fever. Has tried sudafed with moderate relief. Deniesear fullness, sore throat, wheezing, shortness of breath, chest tightness and chest pain, chills, nausea, vomiting, abdominal pain and diarrhea. Has not had had sick contact with anyone. No history of seasonal allergies orhistory of asthma. Patient has had  flu shot this season. No current smoking, quit 1980.  Drinks alcohol occassionally. Denies any other aggravating or relieving factors, no other questions or concerns.  Todd Diaz has a current medication list which includes the following prescription(s): aspirin, accu-chek compact plus control, blood glucose meter kit and supplies, blood glucose meter kit and supplies, clopidogrel, diazepam, glucose blood, hydrochlorothiazide, lancets, lisinopril, lovastatin, metformin, sildenafil, and viagra. Also has No Known Allergies.  Todd Diaz  has a past medical history of Arthritis; Avascular necrosis of medial femoral condyle ([place]); Chronic prostatitis; Diabetes mellitus ([place]); Erectile dysfunction; Hearing loss; Hyperlipidemia; Hypertension; Hypertrophy of prostate without urinary obstruction and other lower urinary tract symptoms (LUTS); Infection of urinary tract; TIA (transient ischemic attack) (02/23/2014); and Tobacco use disorder. Also  has a past surgical history that includes Prostate surgery; Bladder stone removal; Partial knee arthroplasty (07/24/2011); Amputation (2009); Admission (03/09/2012); Retinal tear repair cryotherapy (08/09/2013); Hernia repair (04/09/2013); Eye surgery (08/09/2013); and Joint replacement.   Objective:   Vitals: BP 122/80 (BP Location: Right Arm, Patient Position: Sitting, Cuff Size: Normal)   Pulse 87   Temp 98.3  F (36.8 C) (Oral)   Resp 16   Ht '5\' 10"'  (1.778 m)   Wt 204 lb (92.5 kg)   SpO2 94%   BMI 29.27 kg/m   Physical Exam  Constitutional: He is oriented to person, place, and time. He appears well-developed and well-nourished. He appears distressed (mild, appears tired sitting in chair).  HENT:  Head: Normocephalic and atraumatic.  Right Ear: Tympanic membrane, external ear and ear canal normal.  Left Ear: Tympanic membrane, external ear and ear canal normal.  Nose: Mucosal edema and rhinorrhea present.  Mouth/Throat: Uvula is midline and mucous membranes are normal. Posterior oropharyngeal erythema present.  Eyes: Conjunctivae are normal.  Neck: Normal range of motion.  Cardiovascular: Normal rate, regular rhythm and normal heart sounds.   Pulmonary/Chest: Effort normal and breath sounds normal.  Neurological: He is alert and oriented to person, place, and time.  Skin: Skin is warm and dry.  Psychiatric: He has a normal mood and affect.  Vitals reviewed.   No results found for this or any previous visit (from the past 24 hour(s)).  Assessment and Plan :  1. Influenza-like illness -History and physical exam consistent with influenza, will cover empirically and also treat symptomatically. Pt instructed to return to clinic if symptoms worsen, do not improve in 5-7 days, or as needed - oseltamivir (TAMIFLU) 75 MG capsule; Take 1 capsule (75 mg total) by mouth 2 (two) times daily.  Dispense: 10 capsule; Refill: 0  2. Cough - benzonatate (TESSALON) 100 MG capsule; Take 1-2 capsules (100-200 mg total) by mouth 3 (three) times daily as needed for cough.  Dispense: 40 capsule; Refill: 0  3. Nasal congestion - fluticasone (FLONASE) 50 MCG/ACT nasal spray; Place 2 sprays into both nostrils daily.  Dispense: 16 g; Refill: 0 - pseudoephedrine (SUDAFED 12 HOUR) 120 MG 12 hr tablet; Take  1 tablet (120 mg total) by mouth 2 (two) times daily.  Dispense: 20 tablet; Refill: 0  Tenna Delaine,  PA-C  [facility] 02/16/2016 12:03 PM

## 2016-02-16 NOTE — Patient Instructions (Addendum)
Take tamiflu as prescribed. Use sudafed and flonase for nasal congestion. You can use tylenol for headache. Use tessalon perles for cough supression or OTC delsym.  Please return if no improvement in 5-7 days or sooner if symptoms worsen.     IF you received an x-ray today, you will receive an invoice from [facility]. Please contact [facility] at [phone] with questions or concerns regarding your invoice.   IF you received labwork today, you will receive an invoice from [place]. Please contact [facility] at [phone] with questions or concerns regarding your invoice.   Our billing staff will not be able to assist you with questions regarding bills from these companies.  You will be contacted with the lab results as soon as they are available. The fastest way to get your results is to activate your My Chart account. Instructions are located on the last page of this paperwork. If you have not heard from [place] regarding the results in 2 weeks, please contact this office.      Influenza, Adult Influenza ("the flu") is an infection in the lungs, nose, and throat (respiratory tract). It is caused by a virus. The flu causes many common cold symptoms, as well as a high fever and body aches. It can make you feel very sick. The flu spreads easily from person to person (is contagious). Getting a flu shot (influenza vaccination) every year is the best way to prevent the flu. Follow these instructions at home:  Take over-the-counter and prescription medicines only as told by your doctor.  Use a cool mist humidifier to add moisture (humidity) to the air in your home. This can make it easier to breathe.  Rest as needed.  Drink enough fluid to keep your pee (urine) clear or pale yellow.  Cover your mouth and nose when you cough or sneeze.  Wash your hands with soap and water often, especially after you cough or sneeze. If you cannot use soap and water, use hand  sanitizer.  Stay home from work or school as told by your doctor. Unless you are visiting your doctor, try to avoid leaving home until your fever has been gone for 24 hours without the use of medicine.  Keep all follow-up visits as told by your doctor. This is important. How is this prevented?  Getting a yearly (annual) flu shot is the best way to avoid getting the flu. You may get the flu shot in late summer, fall, or winter. Ask your doctor when you should get your flu shot.  Wash your hands often or use hand sanitizer often.  Avoid contact with people who are sick during cold and flu season.  Eat healthy foods.  Drink plenty of fluids.  Get enough sleep.  Exercise regularly. Contact a doctor if:  You get new symptoms.  You have:  Chest pain.  Watery poop (diarrhea).  A fever.  Your cough gets worse.  You start to have more mucus.  You feel sick to your stomach (nauseous).  You throw up (vomit). Get help right away if:  You start to be short of breath or have trouble breathing.  Your skin or nails turn a bluish color.  You have very bad pain or stiffness in your neck.  You get a sudden headache.  You get sudden pain in your face or ear.  You cannot stop throwing up. This information is not intended to replace acodvice given to you by your health care provider. Make sure you discuss any  questions you have with your health care provider. Document Released: 10/05/2007 Document Revised: 06/03/2015 Document Reviewed: 10/20/2014 Elsevier Interactive Patient Education  2017 [employer].

## 2016-05-16 ENCOUNTER — Ambulatory Visit: Payer: Medicare PPO | Admitting: Family Medicine

## 2016-07-13 ENCOUNTER — Other Ambulatory Visit: Payer: Self-pay | Admitting: Family Medicine

## 2016-07-17 ENCOUNTER — Telehealth: Payer: Self-pay | Admitting: *Deleted

## 2016-07-17 NOTE — Telephone Encounter (Signed)
Patient called after hours doc for med refill of plavix  Dr Delice Lesch took  Call and responded   Will send faxed noted to be scanned in.. For documentation

## 2016-08-17 ENCOUNTER — Other Ambulatory Visit: Payer: Self-pay | Admitting: Neurology

## 2016-08-18 ENCOUNTER — Other Ambulatory Visit: Payer: Self-pay | Admitting: *Deleted

## 2016-08-18 MED ORDER — CLOPIDOGREL BISULFATE 75 MG PO TABS: 75.0000 mg | ORAL_TABLET | Freq: Every day | ORAL | 3 refills | Status: DC

## 2016-10-31 DIAGNOSIS — D303 Benign neoplasm of bladder: Secondary | ICD-10-CM | POA: Diagnosis not present

## 2016-10-31 DIAGNOSIS — R339 Retention of urine, unspecified: Secondary | ICD-10-CM | POA: Diagnosis not present

## 2016-10-31 DIAGNOSIS — N401 Enlarged prostate with lower urinary tract symptoms: Secondary | ICD-10-CM | POA: Diagnosis not present

## 2016-10-31 DIAGNOSIS — N138 Other obstructive and reflux uropathy: Secondary | ICD-10-CM | POA: Diagnosis not present

## 2016-11-21 ENCOUNTER — Telehealth: Payer: Self-pay | Admitting: *Deleted

## 2016-11-21 NOTE — Telephone Encounter (Signed)
Copied from [place] [id number]. Topic: Referral - Request >> Nov 21, 2016  9:31 AM Hewitt Shorts wrote: Reason for CRM: pt is needing a referral from Dr. Tamala Julian to get him a new PCP closer to his home it is getting harder for him to travel this far back and forth   Best number    >> Nov 21, 2016 10:35 AM Yvette Rack wrote: Pt calling Almyra Free back

## 2016-11-21 NOTE — Telephone Encounter (Signed)
Copied from [place] [phone]. Topic: Referral - Request >> Nov 21, 2016  9:31 AM Hewitt Shorts wrote: Reason for CRM: pt is needing a referral from Dr. Tamala Julian to get him a new PCP closer to his home it is getting harder for him to travel this far back and forth   Best number

## 2016-11-21 NOTE — Telephone Encounter (Signed)
Patient informed he shouldn't need a referral for a new primary care doctor.

## 2016-11-21 NOTE — Telephone Encounter (Signed)
Lm to clarify message.   Patient shouldn't need a referral if he is looking for a new primary care, or is he looking for one for a specialist?

## 2017-01-18 ENCOUNTER — Ambulatory Visit: Payer: Medicare PPO | Admitting: Family Medicine

## 2017-01-18 ENCOUNTER — Encounter: Payer: Self-pay | Admitting: Family Medicine

## 2017-01-18 VITALS — BP 122/70 | HR 83 | Temp 98.9°F | Resp 16 | Ht 70.0 in | Wt 196.0 lb

## 2017-01-18 DIAGNOSIS — E7800 Pure hypercholesterolemia, unspecified: Secondary | ICD-10-CM | POA: Diagnosis not present

## 2017-01-18 DIAGNOSIS — F410 Panic disorder [episodic paroxysmal anxiety] without agoraphobia: Secondary | ICD-10-CM

## 2017-01-18 DIAGNOSIS — Z8673 Personal history of transient ischemic attack (TIA), and cerebral infarction without residual deficits: Secondary | ICD-10-CM

## 2017-01-18 DIAGNOSIS — Z23 Encounter for immunization: Secondary | ICD-10-CM

## 2017-01-18 DIAGNOSIS — I10 Essential (primary) hypertension: Secondary | ICD-10-CM | POA: Diagnosis not present

## 2017-01-18 DIAGNOSIS — E119 Type 2 diabetes mellitus without complications: Secondary | ICD-10-CM

## 2017-01-18 LAB — POCT GLYCOSYLATED HEMOGLOBIN (HGB A1C)
Est. average glucose Bld gHb Est-mCnc: 120
Hemoglobin A1C: 5.8

## 2017-01-18 MED ORDER — DIAZEPAM 5 MG PO TABS: ORAL_TABLET | ORAL | 0 refills | Status: DC

## 2017-01-18 NOTE — Progress Notes (Signed)
Patient: Todd Diaz, Male    DOB: 1940/05/31, 77 y.o.   MRN: [id number] Visit Date: 01/19/2017  Today's Provider: Lavon Paganini, MD   I, Martha Clan, CMA, am acting as scribe for Lavon Paganini, MD.  Chief Complaint  Patient presents with  . Establish Care   Subjective:    Todd Diaz is a 77 y.o. male who presents today for health maintenance and to establish care. He feels well. He reports he is not exercising regularly, but is busy at work and plays golf. He reports he is sleeping well.  ----------------------------------------------------------------- Mr. Leeson was previously a patient of Dr. Thompson Caul at [facility] practice. He followed her to [facility], and saw her until his 3rd wife died. He has diabetes, hypertension, hyperlipidemia. He also has a H/O panic attacks. He is taking Diazepam for his panic attacks.  Thinks it has been ~8-9 months since he last took.  Only takes for rare panic attacks  Chronic prostatitis, recurrent UTIs, BPH: Followed by Dr Jacqlyn Larsen. States he has had surgeries for stones in bladder and prostate in the past.  Things are better since starting Flomax for incomplete bladder emptying.  T2DM: Only taking Metformin 534m BID.  States A1c have been well controlled.  Last foot exam 06/2015. Last eye exam in 2016. UTD on pneumococcal vaccination  HTN: Taking lisinopril, HCTZ.  States this has been well controlled previously.  HLD: Taking lovastatin 263mdaily. Last lipid panel 02/05/16.  Denies myalgias  H/o TIA: Followed by Dr [facility]). Taking full dose aspirin and Plavix. Denies any weakness, numbness, vision changes  Review of Systems  Constitutional: Negative.   HENT: Positive for hearing loss and tinnitus. Negative for congestion, dental problem, drooling, ear discharge, ear pain, facial swelling, mouth sores, nosebleeds, postnasal drip, rhinorrhea, sinus pressure, sinus pain, sneezing, sore throat,  trouble swallowing and voice change.   Eyes: Positive for photophobia. Negative for pain, discharge, redness, itching and visual disturbance.  Respiratory: Negative.   Cardiovascular: Negative.   Gastrointestinal: Negative.   Endocrine: Negative.   Genitourinary: Negative.   Musculoskeletal: Negative.   Skin: Negative.   Allergic/Immunologic: Negative.   Neurological: Negative.   Hematological: Negative.   Psychiatric/Behavioral: Negative.     Social History      He  reports that he quit smoking about 31 years ago. His smoking use included cigarettes. He has a 10.00 pack-year smoking history. His smokeless tobacco use includes snuff. He reports that he drinks alcohol. He reports that he does not use drugs.       Social History   Socioeconomic History  . Marital status: Widowed    Spouse name: None  . Number of children: 4  . Years of education: 1261. Highest education level: High school graduate  Social Needs  . Financial resource strain: Not hard at all  . Food insecurity - worry: Never true  . Food insecurity - inability: Never true  . Transportation needs - medical: No  . Transportation needs - non-medical: No  Occupational History  . Occupation: Retired  . Occupation: CaEngineer, agriculturalTobacco Use  . Smoking status: Former Smoker    Packs/day: 0.50    Years: 20.00    Pack years: 10.00    Types: Cigarettes    Last attempt to quit: 01/09/1986    Years since quitting: 31.0  . Smokeless tobacco: Current User    Types: Snuff  . Tobacco comment: chews about 1/2 a pack  of tobacco currently;   Substance and Sexual Activity  . Alcohol use: Yes    Alcohol/week: 0.0 oz    Comment: occasional 4-5 beers per weekend night.  DUI in 2000.  . Drug use: No  . Sexual activity: Not Currently    Partners: Female  Other Topics Concern  . None  Social History Narrative   Widowed since 02/2009 after 70 years second marriage       Children: 6 children, 2 stepchildren; 8  grandchildren, 2 gg.      Lives: with cat, dog.      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      Tobacco: quit smoking age 4; smoked x 22 years.      Alcohol:  Beer every weekend (5-6 beers per night on weekends).      Seatbelt:  Always uses seat belts, Smoke alarm in the home,      Guns: >5 guns in the home loaded and locked up.      Caffeine RJJ:OACZYS, Tea, Carbonated beverages; consumes a moderate amount.      Exercise: Inactive.     Advanced Directives:  +LIVING WILL; FULL CODE, No HCPOA.      ADLs: independent with ADLs.  Drives.    Past Medical History:  Diagnosis Date  . Arthritis    Knees  . Avascular necrosis of medial femoral condyle ([place])   . Chronic prostatitis   . Diabetes mellitus ([place])    controlled with medication  . Erectile dysfunction   . Hearing loss   . Hyperlipidemia   . Hypertension    controlled with medication  . Hypertrophy of prostate without urinary obstruction and other lower urinary tract symptoms (LUTS)   . Infection of urinary tract    ASSESSMENT:Recurrent MRSA UTI F/B Dr. Clayborn Bigness and Dr. Jacqlyn Larsen  . TIA (transient ischemic attack) 02/23/2014   R sided weakness and numbness; duration 1 hour; evaluated [facility].  . Tobacco use disorder      Patient Active Problem List   Diagnosis Date Noted  . Degenerative disc disease, lumbar 01/12/2015  . Type 2 diabetes mellitus without complication, without long-term current use of insulin ([place]) 01/12/2015  . History of TIA (transient ischemic attack) 01/12/2015  . Right leg numbness 08/18/2014  . Bilateral inguinal hernia (BIH) s/p lap reapir w mesh 03/03/2013 02/26/2013  . Panic attack 05/20/2012  . Bladder stone 03/25/2012  . Essential hypertension, benign 12/26/2011  . Impotence 12/26/2011  . Pure hypercholesterolemia 12/26/2011  . Avascular necrosis of medial femoral condyle [facility])     Past Surgical History:  Procedure Laterality Date  . Admission  03/09/2012   [facility]; 24 hour admission; LE  numbness/tingling.  CT head, MRI brain, carotid dopplers, 2D-echo.  . AMPUTATION  2009   L fifth finger surgery for near amputation. [place].  Marland Kitchen BLADDER STONE REMOVAL    . EYE SURGERY  08/09/2013   B cataract surgery; [place]  . HERNIA REPAIR  04/09/2013   3 repaired; [place].  . JOINT REPLACEMENT    . PARTIAL KNEE ARTHROPLASTY  07/24/2011   Procedure: UNICOMPARTMENTAL KNEE;  Surgeon: Lorn Junes, MD;  Location: [place];  Service: Orthopedics;  Laterality: Left;  . PROSTATE SURGERY    . RETINAL TEAR REPAIR CRYOTHERAPY  08/09/2013   [name]    Family History        Family Status  Relation Name Status  . Mother  Deceased at age 66       Lung cancer  .  Father  Deceased at age 74       Massive Heart Attack  . Son #3 Alive  . Sister  Alive        His family history includes Aneurysm (age of onset: 69) in his father; Cancer in his mother; Diabetes in his son; Healthy in his sister; Heart attack in his father; Heart disease (age of onset: 43) in his father; Hyperlipidemia in his father; Hypertension in his father and mother.     No Known Allergies   Current Outpatient Medications:  .  aspirin 325 MG tablet, Take 325 mg by mouth daily., Disp: , Rfl:  .  Blood Glucose Calibration (ACCU-CHEK COMPACT PLUS CONTROL) SOLN, Test blood sugar daily. Dx code: E11.9, Disp: 1 each, Rfl: 0 .  blood glucose meter kit and supplies KIT, Dispense based on patient and insurance preference. Check sugar once daily as directed. (FOR ICD-9 250.00, 250.01)., Disp: 1 each, Rfl: 0 .  Blood Glucose Monitoring Suppl (BLOOD GLUCOSE METER KIT AND SUPPLIES) KIT, Test blood sugar daily. Dx code: E11.9, Disp: 1 each, Rfl: 0 .  clopidogrel (PLAVIX) 75 MG tablet, Take 1 tablet (75 mg total) by mouth daily., Disp: 90 tablet, Rfl: 3 .  diazepam (VALIUM) 5 MG tablet, Take 1/2 to 1 po daily as needed with onset of panic attack, Disp: 20 tablet, Rfl: 0 .  glucose blood test strip, Test blood sugar daily. Dx Code: E11.9, Disp: 100  each, Rfl: 3 .  hydrochlorothiazide (HYDRODIURIL) 25 MG tablet, take 1 tablet by mouth once daily, Disp: 90 tablet, Rfl: 1 .  Lancets MISC, Test blood sugar daily. Dx code: E11.9, Disp: 100 each, Rfl: 3 .  lisinopril (PRINIVIL,ZESTRIL) 20 MG tablet, take 1 tablet by mouth once daily, Disp: 90 tablet, Rfl: 1 .  lovastatin (MEVACOR) 20 MG tablet, take 1 tablet by mouth at bedtime, Disp: 90 tablet, Rfl: 1 .  metFORMIN (GLUCOPHAGE) 500 MG tablet, take 1 tablet by mouth twice a day with food, Disp: 180 tablet, Rfl: 1 .  tamsulosin (FLOMAX) 0.4 MG CAPS capsule, Take 0.4 mg by mouth daily after breakfast., Disp: , Rfl:  .  fluticasone (FLONASE) 50 MCG/ACT nasal spray, Place 2 sprays into both nostrils daily. (Patient not taking: Reported on 01/18/2017), Disp: 16 g, Rfl: 0   Patient Care Team: Virginia Crews, MD as PCP - General (Family Medicine) Murrell Redden, MD (Urology) Pieter Partridge, DO as Consulting Physician (Neurology) Michael Boston, MD as Consulting Physician (General Surgery)      Objective:   Vitals: BP 122/70 (BP Location: Left Arm, Patient Position: Sitting, Cuff Size: Large)   Pulse 83   Temp 98.9 F (37.2 C) (Oral)   Resp 16   Ht 5' 10" (1.778 m)   Wt 196 lb (88.9 kg)   SpO2 95%   BMI 28.12 kg/m    Vitals:   01/18/17 1419  BP: 122/70  Pulse: 83  Resp: 16  Temp: 98.9 F (37.2 C)  TempSrc: Oral  SpO2: 95%  Weight: 196 lb (88.9 kg)  Height: 5' 10" (1.778 m)     Physical Exam  Constitutional: He is oriented to person, place, and time. He appears well-developed and well-nourished. No distress.  HENT:  Head: Normocephalic and atraumatic.  Right Ear: External ear normal.  Left Ear: External ear normal.  Nose: Nose normal.  Mouth/Throat: Oropharynx is clear and moist.  Eyes: Conjunctivae and EOM are normal. Pupils are equal, round, and reactive to light. No scleral  icterus.  Neck: Neck supple. No thyromegaly present.  Cardiovascular: Normal rate, regular  rhythm, normal heart sounds and intact distal pulses.  No murmur heard. Pulmonary/Chest: Effort normal and breath sounds normal. No respiratory distress. He has no wheezes. He has no rales.  Abdominal: Soft. Bowel sounds are normal. He exhibits no distension. There is no tenderness. There is no rebound and no guarding.  Musculoskeletal: He exhibits no edema or deformity.  Lymphadenopathy:    He has no cervical adenopathy.  Neurological: He is alert and oriented to person, place, and time.  Skin: Skin is warm and dry. No rash noted.  Psychiatric: He has a normal mood and affect. His behavior is normal.  Vitals reviewed.    Depression Screen PHQ 2/9 Scores 01/18/2017 02/16/2016 06/15/2015 01/11/2015  PHQ - 2 Score 0 0 0 0    Results for orders placed or performed in visit on 01/18/17  POCT glycosylated hemoglobin (Hb A1C)  Result Value Ref Range   Hemoglobin A1C 5.8    Est. average glucose Bld gHb Est-mCnc 120      Assessment & Plan:    Problem List Items Addressed This Visit      Cardiovascular and Mediastinum   Essential hypertension, benign    Well controlled No change in medications Obtain labs Continue to monitor      Relevant Orders   Comprehensive metabolic panel     Endocrine   Type 2 diabetes mellitus without complication, without long-term current use of insulin ([place]) - Primary    Well controlled A1c great at 5.8 with no hypoglycemia Discussed diet and exercise Needs foot exam at f/u visit Patient to schedule repeat eye exam UTD on vaccines      Relevant Orders   POCT glycosylated hemoglobin (Hb A1C) (Completed)     Other   Pure hypercholesterolemia    Tolerating statin well without myalgias Obtain repeat lipid panel and CMP after 02/04/17 (one year from last check) Can consider stronger statin at that time      Relevant Orders   Comprehensive metabolic panel   Lipid panel   Panic attack    Fairly well controlled Has only used ~15 tabs of Valium in  last year Refill for small quantity      Relevant Medications   diazepam (VALIUM) 5 MG tablet   History of TIA (transient ischemic attack)    Continue to be followed by neurology Continue aspirin and plavix Discussed warning signs of stroke Discussed importance of good risk factor management      Relevant Orders   CBC w/Diff/Platelet    Other Visit Diagnoses    Encounter for immunization       Relevant Orders   Flu vaccine HIGH DOSE PF (Completed)      --------------------------------------------------------------------  Return in about 6 months (around 07/18/2017) for CPE/AWV.   The entirety of the information documented in the History of Present Illness, Review of Systems and Physical Exam were personally obtained by me. Portions of this information were initially documented by Raquel Sarna Ratchford, CMA and reviewed by me for thoroughness and accuracy.    Virginia Crews, MD, MPH [facility] 01/19/2017 5:00 PM

## 2017-01-18 NOTE — Patient Instructions (Addendum)
  Diet Recommendations for Diabetes   Starchy (carb) foods include: Bread, rice, pasta, potatoes, corn, crackers, bagels, muffins, all baked goods.  (Fruits, milk, and yogurt also have carbohydrate, but most of these foods will not spike your blood sugar as the starchy foods will.)  A few fruits do cause high blood sugars; use small portions of bananas (limit to 1/2 at a time), grapes, watermelon, and most tropical fruits.    Protein foods include: Meat, fish, poultry, eggs, dairy foods, and beans such as pinto and kidney beans (beans also provide carbohydrate).   1. Eat at least 3 meals and 1-2 snacks per day. Never go more than 4-5 hours while awake without eating. Eat breakfast within the first hour of getting up.   2. Limit starchy foods to TWO per meal and ONE per snack. ONE portion of a starchy  food is equal to the following:   - ONE slice of bread (or its equivalent, such as half of a hamburger bun).   - 1/2 cup of a "scoopable" starchy food such as potatoes or rice.   - 15 grams of carbohydrate as shown on food label.  3. Include at every meal: a protein food, a carb food, and vegetables and/or fruit.   - Obtain twice as many veg's as protein or carbohydrate foods for both lunch and dinner.   - Fresh or frozen veg's are best.   - Try to keep frozen veg's on hand for a quick vegetable serving.        Come back to get labs after 02/04/17

## 2017-01-19 NOTE — Assessment & Plan Note (Signed)
Continue to be followed by neurology Continue aspirin and plavix Discussed warning signs of stroke Discussed importance of good risk factor management

## 2017-01-19 NOTE — Assessment & Plan Note (Signed)
Fairly well controlled Has only used ~15 tabs of Valium in last year Refill for small quantity

## 2017-01-19 NOTE — Assessment & Plan Note (Signed)
Tolerating statin well without myalgias Obtain repeat lipid panel and CMP after 02/04/17 (one year from last check) Can consider stronger statin at that time

## 2017-01-19 NOTE — Assessment & Plan Note (Signed)
Well controlled A1c great at 5.8 with no hypoglycemia Discussed diet and exercise Needs foot exam at f/u visit Patient to schedule repeat eye exam UTD on vaccines

## 2017-01-19 NOTE — Assessment & Plan Note (Signed)
Well controlled No change in medications Obtain labs Continue to monitor

## 2017-02-02 ENCOUNTER — Ambulatory Visit: Payer: Medicare PPO | Admitting: Neurology

## 2017-02-02 ENCOUNTER — Encounter: Payer: Self-pay | Admitting: Neurology

## 2017-02-02 VITALS — BP 122/90 | HR 97 | Ht 72.0 in | Wt 196.2 lb

## 2017-02-02 DIAGNOSIS — M5386 Other specified dorsopathies, lumbar region: Secondary | ICD-10-CM | POA: Diagnosis not present

## 2017-02-02 DIAGNOSIS — M5417 Radiculopathy, lumbosacral region: Secondary | ICD-10-CM | POA: Diagnosis not present

## 2017-02-02 DIAGNOSIS — G459 Transient cerebral ischemic attack, unspecified: Secondary | ICD-10-CM | POA: Diagnosis not present

## 2017-02-02 DIAGNOSIS — I10 Essential (primary) hypertension: Secondary | ICD-10-CM

## 2017-02-02 DIAGNOSIS — E785 Hyperlipidemia, unspecified: Secondary | ICD-10-CM | POA: Diagnosis not present

## 2017-02-02 NOTE — Progress Notes (Signed)
NEUROLOGY FOLLOW UP OFFICE NOTE  KALVEN GANIM [id number]  HISTORY OF PRESENT ILLNESS: Todd Diaz is a 77 year old right-handed man with hypertension, hyperlipidemia, type 2 diabetes and history of panic attacks who follows up for TIA.     UPDATE: He is on Plavix, ASA 311m and lovastatin 359mdaily.   02/11/16 Carotid doppler:  1-39% bilateral ICA stenosis with heterogeneous plaque. 01/18/17 LABS:  Hgb A1c 5.8 No recurrent TIA events.  His right leg has started to bother him again.  No back or radicular pain, but numbness and paresthesias on lateral and posterior aspect of leg, particularly when sitting.  Legs feel weak.    HISTORY: TIAs [facility] with sudden onset of right arm and leg numbness and weakness while watching TV.  MRI of brain revealed punctate acute left frontoparietal infarcts.  Symptoms resolved by the next morning.  Carotid doppler showed bilateral atherosclerotic plaque, slightly worse in the left ICA compared to prior doppler from 2014.  He was already on ASA 8142maily which was unchanged.  I don't have any records from this visit.   [facility] with right upper and lower extremity weakness with paresthesias.  He was out of the window for tPA.  CT of head was negative.  Symptoms lasted about 2 hours.  His ASA was increased to 325m82md discharged from the ED with instructions for neurology follow-up.  He reportedly had another carotid doppler which was normal.  Lipid panel from 03/25/14 showed cholesterol 186, HDL 30 and LDL 93.  Hgb A1c was 6.4 with mean plasma glucose of 137.  2D echo from 08/25/14 showed LV EF 55-60%.  Labs from 01/11/15 include Hgb A1c 6.3%    RIGHT LUMBOSACRAL RADICULOPATHY: He has history of numbness in the back of his right leg from the thigh down to the foot.  It only occurs when he is sitting in certain chairs or while driving.  There is no associated weakness, pain or back pain, although he  has a history of back pain.  It resolves when he stands up and walks.  Lumbar Xray from January 2017 revealed degenerative disc disease at L4-5 and L5-S1.  Physical therapy helped.  PAST MEDICAL HISTORY: Past Medical History:  Diagnosis Date  . Arthritis    Knees  . Avascular necrosis of medial femoral condyle (HCC)[place]. Chronic prostatitis   . Diabetes mellitus (HCC)[place] controlled with medication  . Erectile dysfunction   . Hearing loss   . Hyperlipidemia   . Hypertension    controlled with medication  . Hypertrophy of prostate without urinary obstruction and other lower urinary tract symptoms (LUTS)   . Infection of urinary tract    ASSESSMENT:Recurrent MRSA UTI F/B Dr. BlocClayborn Bigness Dr. CopeJacqlyn LarsenTIA (transient ischemic attack) 02/23/2014   R sided weakness and numbness; duration 1 hour; evaluated [facility].  . Tobacco use disorder     MEDICATIONS: Current Outpatient Medications on File Prior to Visit  Medication Sig Dispense Refill  . aspirin 325 MG tablet Take 325 mg by mouth daily.    . Blood Glucose Calibration (ACCU-CHEK COMPACT PLUS CONTROL) SOLN Test blood sugar daily. Dx code: E11.9 1 each 0  . blood glucose meter kit and supplies KIT Dispense based on patient and insurance preference. Check sugar once daily as directed. (FOR ICD-9 250.00, 250.01). 1 each 0  . Blood Glucose Monitoring Suppl (BLOOD GLUCOSE  METER KIT AND SUPPLIES) KIT Test blood sugar daily. Dx code: E11.9 1 each 0  . clopidogrel (PLAVIX) 75 MG tablet Take 1 tablet (75 mg total) by mouth daily. 90 tablet 3  . diazepam (VALIUM) 5 MG tablet Take 1/2 to 1 po daily as needed with onset of panic attack 20 tablet 0  . fluticasone (FLONASE) 50 MCG/ACT nasal spray Place 2 sprays into both nostrils daily. (Patient not taking: Reported on 01/18/2017) 16 g 0  . glucose blood test strip Test blood sugar daily. Dx Code: E11.9 100 each 3  . hydrochlorothiazide (HYDRODIURIL) 25 MG tablet take 1 tablet by mouth once daily 90 tablet 1   . Lancets MISC Test blood sugar daily. Dx code: E11.9 100 each 3  . lisinopril (PRINIVIL,ZESTRIL) 20 MG tablet take 1 tablet by mouth once daily 90 tablet 1  . lovastatin (MEVACOR) 20 MG tablet take 1 tablet by mouth at bedtime 90 tablet 1  . metFORMIN (GLUCOPHAGE) 500 MG tablet take 1 tablet by mouth twice a day with food 180 tablet 1  . tamsulosin (FLOMAX) 0.4 MG CAPS capsule Take 0.4 mg by mouth daily after breakfast.     No current facility-administered medications on file prior to visit.     ALLERGIES: No Known Allergies  FAMILY HISTORY: Family History  Problem Relation Age of Onset  . Hypertension Mother   . Cancer Mother        lung  . Heart attack Father   . Hypertension Father   . Hyperlipidemia Father   . Aneurysm Father 47       Brain  . Heart disease Father 62       AMI as cause of death  . Diabetes Son   . Healthy Sister     SOCIAL HISTORY: Social History   Socioeconomic History  . Marital status: Widowed    Spouse name: Not on file  . Number of children: 4  . Years of education: 29  . Highest education level: High school graduate  Social Needs  . Financial resource strain: Not hard at all  . Food insecurity - worry: Never true  . Food insecurity - inability: Never true  . Transportation needs - medical: No  . Transportation needs - non-medical: No  Occupational History  . Occupation: Retired  . Occupation: Engineer, agricultural  Tobacco Use  . Smoking status: Former Smoker    Packs/day: 0.50    Years: 20.00    Pack years: 10.00    Types: Cigarettes    Last attempt to quit: 01/09/1986    Years since quitting: 31.0  . Smokeless tobacco: Current User    Types: Snuff  . Tobacco comment: chews about 1/2 a pack of tobacco currently;   Substance and Sexual Activity  . Alcohol use: Yes    Alcohol/week: 0.0 oz    Comment: occasional 4-5 beers per weekend night.  DUI in 2000.  . Drug use: No  . Sexual activity: Not Currently    Partners: Female  Other  Topics Concern  . Not on file  Social History Narrative   Widowed since 02/2009 after 70 years second marriage       Children: 6 children, 2 stepchildren; 8 grandchildren, 2 gg.      Lives: with cat, dog.      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      Tobacco: quit smoking age 38; smoked x 22 years.      Alcohol:  Beer every weekend (  5-6 beers per night on weekends).      Seatbelt:  Always uses seat belts, Smoke alarm in the home,      Guns: >5 guns in the home loaded and locked up.      Caffeine YTR:ZNBVAP, Tea, Carbonated beverages; consumes a moderate amount.      Exercise: Inactive.     Advanced Directives:  +LIVING WILL; FULL CODE, No HCPOA.      ADLs: independent with ADLs.  Drives.    REVIEW OF SYSTEMS: Constitutional: No fevers, chills, or sweats, no generalized fatigue, change in appetite Eyes: No visual changes, double vision, eye pain Ear, nose and throat: No hearing loss, ear pain, nasal congestion, sore throat Cardiovascular: No chest pain, palpitations Respiratory:  No shortness of breath at rest or with exertion, wheezes GastrointestinaI: No nausea, vomiting, diarrhea, abdominal pain, fecal incontinence Genitourinary:  No dysuria, urinary retention or frequency Musculoskeletal:  No neck pain, back pain Integumentary: No rash, pruritus, skin lesions Neurological: as above Psychiatric: No depression, insomnia, anxiety Endocrine: No palpitations, fatigue, diaphoresis, mood swings, change in appetite, change in weight, increased thirst Hematologic/Lymphatic:  No purpura, petechiae. Allergic/Immunologic: no itchy/runny eyes, nasal congestion, recent allergic reactions, rashes  PHYSICAL EXAM: Vitals:   02/02/17 0836  BP: 122/90  Pulse: 97  SpO2: (!) 89%   General: No acute distress.  Patient appears well-groomed.   Head:  Normocephalic/atraumatic Eyes:  Fundi examined but not visualized Neck: supple, no paraspinal tenderness, full range of  motion Heart:  Regular rate and rhythm Lungs:  Clear to auscultation bilaterally Back: No paraspinal tenderness Neurological Exam: alert and oriented to person, place, and time. Attention span and concentration intact, recent and remote memory intact, fund of knowledge intact.  Speech fluent and not dysarthric, language intact.  CN II-XII intact. Bulk and tone normal, muscle strength 5/5 throughout.  Sensation to light touch  intact.  Deep tendon reflexes 2+ throughout.  Finger to nose testing intact.  Gait normal, Romberg negative.  IMPRESSION: CVA Right sciatica or S1 radiculopathy Essential hypertension. Type 2 diabetes Hyperlipidemia  PLAN: 1.  He does not need to be on dual antiplatelet therapy for secondary stroke prevention.  I recommend that he stop ASA but continue Plavix 34m daily. 2.  Continue blood pressure/glycemic control and statin therapy (LDL goal less than 70) as per PCP. 3.  We will refer for NCV-EMG of right lower extremity to evaluate radicular symptoms further.   4.  Follow up in one year  19 minutes spent face to face with patient, over 50% spent discussing management.  AMetta Clines DO  CC: ALavon Paganini MD

## 2017-02-02 NOTE — Addendum Note (Signed)
Addended by: Clois Comber on: 02/02/2017 09:28 AM   Modules accepted: Orders

## 2017-02-02 NOTE — Patient Instructions (Signed)
1.  Continue Plavix but you may stop aspirin 2.  Continue blood pressure, diabetes and cholesterol control 3.  We will get nerve study of right leg 4.  Follow up in one year or as  Needed.

## 2017-02-05 DIAGNOSIS — E7800 Pure hypercholesterolemia, unspecified: Secondary | ICD-10-CM | POA: Diagnosis not present

## 2017-02-05 DIAGNOSIS — I10 Essential (primary) hypertension: Secondary | ICD-10-CM | POA: Diagnosis not present

## 2017-02-05 DIAGNOSIS — Z8673 Personal history of transient ischemic attack (TIA), and cerebral infarction without residual deficits: Secondary | ICD-10-CM | POA: Diagnosis not present

## 2017-02-06 ENCOUNTER — Telehealth: Payer: Self-pay

## 2017-02-06 LAB — CBC WITH DIFFERENTIAL/PLATELET
Basophils Absolute: 0 10*3/uL (ref 0.0–0.2)
Basos: 0 %
EOS (ABSOLUTE): 0.1 10*3/uL (ref 0.0–0.4)
Eos: 3 %
Hematocrit: 47.6 % (ref 37.5–51.0)
Hemoglobin: 16.9 g/dL (ref 13.0–17.7)
Immature Grans (Abs): 0 10*3/uL (ref 0.0–0.1)
Immature Granulocytes: 0 %
Lymphocytes Absolute: 1.2 10*3/uL (ref 0.7–3.1)
Lymphs: 22 %
MCH: 31.4 pg (ref 26.6–33.0)
MCHC: 35.5 g/dL (ref 31.5–35.7)
MCV: 89 fL (ref 79–97)
Monocytes Absolute: 0.5 10*3/uL (ref 0.1–0.9)
Monocytes: 9 %
Neutrophils Absolute: 3.6 10*3/uL (ref 1.4–7.0)
Neutrophils: 66 %
Platelets: 211 10*3/uL (ref 150–379)
RBC: 5.38 x10E6/uL (ref 4.14–5.80)
RDW: 13.7 % (ref 12.3–15.4)
WBC: 5.5 10*3/uL (ref 3.4–10.8)

## 2017-02-06 LAB — COMPREHENSIVE METABOLIC PANEL
ALT: 24 IU/L (ref 0–44)
AST: 29 IU/L (ref 0–40)
Albumin/Globulin Ratio: 1.5 (ref 1.2–2.2)
Albumin: 4.3 g/dL (ref 3.5–4.8)
Alkaline Phosphatase: 85 IU/L (ref 39–117)
BUN/Creatinine Ratio: 13 (ref 10–24)
BUN: 11 mg/dL (ref 8–27)
Bilirubin Total: 0.5 mg/dL (ref 0.0–1.2)
CO2: 25 mmol/L (ref 20–29)
Calcium: 9.8 mg/dL (ref 8.6–10.2)
Chloride: 98 mmol/L (ref 96–106)
Creatinine, Ser: 0.83 mg/dL (ref 0.76–1.27)
GFR calc Af Amer: 97 mL/min/{1.73_m2} (ref >59)
GFR calc non Af Amer: 84 mL/min/{1.73_m2} (ref >59)
Globulin, Total: 2.9 g/dL (ref 1.5–4.5)
Glucose: 131 mg/dL — ABNORMAL HIGH (ref 65–99)
Potassium: 4.1 mmol/L (ref 3.5–5.2)
Sodium: 141 mmol/L (ref 134–144)
Total Protein: 7.2 g/dL (ref 6.0–8.5)

## 2017-02-06 LAB — LIPID PANEL
Chol/HDL Ratio: 5.2 ratio — ABNORMAL HIGH (ref 0.0–5.0)
Cholesterol, Total: 160 mg/dL (ref 100–199)
HDL: 31 mg/dL — ABNORMAL LOW (ref >39)
LDL Calculated: 64 mg/dL (ref 0–99)
Triglycerides: 326 mg/dL — ABNORMAL HIGH (ref 0–149)
VLDL Cholesterol Cal: 65 mg/dL — ABNORMAL HIGH (ref 5–40)

## 2017-02-06 NOTE — Telephone Encounter (Signed)
Pt advised. States he was not fasting prior to blood draw.

## 2017-02-06 NOTE — Telephone Encounter (Signed)
-----   Message from Virginia Crews, MD sent at 02/06/2017 11:39 AM EST ----- Normal electrolytes, kidney function, liver function, blood counts, cholesterol. Blood sugar is elevated if the patient was fasting at the time labs were taken. Triglycerides are also elevated. This can be related to blood sugar. Recommend decreasing, hydrate intake, which includes potatoes, rice, pasta, bread, and other starchy foods. We can follow-up on this at next visit.  Virginia Crews, MD, MPH [facility] 02/06/2017 11:39 AM

## 2017-02-18 DIAGNOSIS — E663 Overweight: Secondary | ICD-10-CM | POA: Diagnosis not present

## 2017-02-18 DIAGNOSIS — E119 Type 2 diabetes mellitus without complications: Secondary | ICD-10-CM | POA: Diagnosis not present

## 2017-02-18 DIAGNOSIS — E785 Hyperlipidemia, unspecified: Secondary | ICD-10-CM | POA: Diagnosis not present

## 2017-02-18 DIAGNOSIS — H9190 Unspecified hearing loss, unspecified ear: Secondary | ICD-10-CM | POA: Diagnosis not present

## 2017-02-18 DIAGNOSIS — I10 Essential (primary) hypertension: Secondary | ICD-10-CM | POA: Diagnosis not present

## 2017-02-18 DIAGNOSIS — F418 Other specified anxiety disorders: Secondary | ICD-10-CM | POA: Diagnosis not present

## 2017-02-18 DIAGNOSIS — N400 Enlarged prostate without lower urinary tract symptoms: Secondary | ICD-10-CM | POA: Diagnosis not present

## 2017-02-18 DIAGNOSIS — G4700 Insomnia, unspecified: Secondary | ICD-10-CM | POA: Diagnosis not present

## 2017-02-18 DIAGNOSIS — Z6826 Body mass index (BMI) 26.0-26.9, adult: Secondary | ICD-10-CM | POA: Diagnosis not present

## 2017-02-22 ENCOUNTER — Ambulatory Visit (INDEPENDENT_AMBULATORY_CARE_PROVIDER_SITE_OTHER): Payer: Medicare PPO | Admitting: Neurology

## 2017-02-22 DIAGNOSIS — M5417 Radiculopathy, lumbosacral region: Secondary | ICD-10-CM

## 2017-02-22 DIAGNOSIS — M5386 Other specified dorsopathies, lumbar region: Secondary | ICD-10-CM | POA: Diagnosis not present

## 2017-02-22 NOTE — Procedures (Signed)
[facility]  [place], [place]  [place],  [address] Tel: [phone] Fax:  [phone] Test Date:  02/22/2017  Patient: Todd Diaz DOB: [birth year] Physician: Narda Amber, DO  Sex: Male Height: 6\' 0"  Ref Phys: Metta Clines, D.O.  ID#: [id number] Temp: 35.2C Technician:    Patient Complaints: This is a 77 year-old man referred for evaluation of the right leg pain and paresthesias.  NCV & EMG Findings: Extensive electrodiagnostic testing of the right lower extremity shows:  1. Right sural and superficial peroneal sensory responses are within normal limits. 2. Right tibial and peroneal motor responses are within normal limits. 3. Right tibial H reflex study is within normal limits when adjusted for patient's height. 4. Chronic motor axon loss changes are seen affecting the anterior tibialis and flexor digitorum longus muscles, without accompanied active denervation.  Impression: Chronic L5 radiculopathy affecting the right lower extremity, mild in degree electrically.   ___________________________ Narda Amber, DO    Nerve Conduction Studies Anti Sensory Summary Table   Stim Site NR Peak (ms) Norm Peak (ms) P-T Amp (V) Norm P-T Amp  Right Sup Peroneal Anti Sensory (Ant Lat Mall)  12 cm    2.9 <4.6 8.6 >3  Right Sural Anti Sensory (Lat Mall)  Calf    3.1 <4.6 7.0 >3   Motor Summary Table   Stim Site NR Onset (ms) Norm Onset (ms) O-P Amp (mV) Norm O-P Amp Site1 Site2 Delta-0 (ms) Dist (cm) Vel (m/s) Norm Vel (m/s)  Right Peroneal Motor (Ext Dig Brev)  Ankle    4.5 <6.0 2.7 >2.5 B Fib Ankle 8.3 33.0 40 >40  B Fib    12.8  2.6  Poplt B Fib 2.0 10.0 50 >40  Poplt    14.8  2.4         Right Peroneal TA Motor (Tib Ant)  Fib Head    3.3 <4.5 6.5 >3 Poplit Fib Head 1.0 8.0 80 >40  Poplit    4.3  6.4         Right Tibial Motor (Abd Hall Brev)  Ankle    3.6 <6.0 4.5 >4 Knee Ankle 9.8 42.0 43 >40  Knee    13.4  2.9          H Reflex Studies   NR  H-Lat (ms) Lat Norm (ms) L-R H-Lat (ms)  Right Tibial (Gastroc)     38.50 <35    EMG   Side Muscle Ins Act Fibs Psw Fasc Number Recrt Dur Dur. Amp Amp. Poly Poly. Comment  Right AntTibialis Nml Nml Nml Nml 1- Rapid Some 1+ Some 1+ Nml Nml N/A  Right Gastroc Nml Nml Nml Nml Nml Nml Nml Nml Nml Nml Nml Nml N/A  Right Flex Dig Long Nml Nml Nml Nml 1- Rapid Some 1+ Some 1+ Nml Nml N/A  Right RectFemoris Nml Nml Nml Nml Nml Nml Nml Nml Nml Nml Nml Nml N/A  Right GluteusMed Nml Nml Nml Nml Nml Nml Nml Nml Nml Nml Nml Nml N/A  Right BicepsFemS Nml Nml Nml Nml Nml Nml Nml Nml Nml Nml Nml Nml N/A      Waveforms:

## 2017-02-27 ENCOUNTER — Telehealth: Payer: Self-pay

## 2017-02-27 DIAGNOSIS — M5416 Radiculopathy, lumbar region: Secondary | ICD-10-CM

## 2017-02-27 NOTE — Telephone Encounter (Signed)
-----   Message from Pieter Partridge, DO sent at 02/23/2017  7:06 AM EST ----- Nerve test shows evidence of old pinched nerves in back.  I would like to get MRI of lumbar spine to evaluate for lumbar radiculopathy

## 2017-02-27 NOTE — Telephone Encounter (Signed)
Called and spoke with Pt, advsd of EMG results and MRI recommendation, sent order to [place]

## 2017-03-22 ENCOUNTER — Inpatient Hospital Stay: Admission: RE | Admit: 2017-03-22 | Payer: Medicare PPO | Source: Ambulatory Visit

## 2017-03-22 ENCOUNTER — Ambulatory Visit
Admission: RE | Admit: 2017-03-22 | Discharge: 2017-03-22 | Disposition: A | Discharge status: Home / Self Care | Payer: Medicare PPO | Source: Ambulatory Visit | Attending: Neurology | Admitting: Neurology

## 2017-03-22 DIAGNOSIS — M48061 Spinal stenosis, lumbar region without neurogenic claudication: Secondary | ICD-10-CM | POA: Diagnosis not present

## 2017-03-22 DIAGNOSIS — M5416 Radiculopathy, lumbar region: Secondary | ICD-10-CM

## 2017-03-23 ENCOUNTER — Other Ambulatory Visit: Payer: Self-pay | Admitting: Neurology

## 2017-03-23 MED ORDER — GABAPENTIN 300 MG PO CAPS: 300.0000 mg | ORAL_CAPSULE | Freq: Every day | ORAL | 2 refills | Status: DC

## 2017-04-10 ENCOUNTER — Other Ambulatory Visit: Payer: Self-pay | Admitting: Family Medicine

## 2017-04-14 ENCOUNTER — Other Ambulatory Visit: Payer: Self-pay | Admitting: Family Medicine

## 2017-04-15 ENCOUNTER — Other Ambulatory Visit: Payer: Self-pay | Admitting: Family Medicine

## 2017-04-16 ENCOUNTER — Other Ambulatory Visit: Payer: Self-pay | Admitting: Family Medicine

## 2017-04-16 NOTE — Telephone Encounter (Signed)
LOV 02/05/16 Dr. Tamala Julian

## 2017-04-17 ENCOUNTER — Other Ambulatory Visit: Payer: Self-pay | Admitting: Family Medicine

## 2017-04-17 MED ORDER — HYDROCHLOROTHIAZIDE 25 MG PO TABS: 25.0000 mg | ORAL_TABLET | Freq: Every day | ORAL | 1 refills | Status: DC

## 2017-04-17 NOTE — Telephone Encounter (Signed)
Patient needs refills on HCTZ 25 mg.  to [facility]. [place].

## 2017-04-17 NOTE — Telephone Encounter (Signed)
LOV 01/18/2017.

## 2017-04-28 ENCOUNTER — Other Ambulatory Visit: Payer: Self-pay | Admitting: Family Medicine

## 2017-05-04 ENCOUNTER — Encounter: Payer: Self-pay | Admitting: Neurology

## 2017-05-22 ENCOUNTER — Other Ambulatory Visit: Payer: Self-pay | Admitting: Family Medicine

## 2017-06-06 ENCOUNTER — Encounter: Payer: Self-pay | Admitting: Family Medicine

## 2017-06-08 ENCOUNTER — Other Ambulatory Visit: Payer: Self-pay | Admitting: Family Medicine

## 2017-06-09 ENCOUNTER — Other Ambulatory Visit: Payer: Self-pay | Admitting: Family Medicine

## 2017-06-10 ENCOUNTER — Other Ambulatory Visit: Payer: Self-pay | Admitting: Family Medicine

## 2017-06-11 ENCOUNTER — Other Ambulatory Visit: Payer: Self-pay | Admitting: Neurology

## 2017-06-15 ENCOUNTER — Other Ambulatory Visit: Payer: Self-pay | Admitting: Family Medicine

## 2017-06-20 ENCOUNTER — Ambulatory Visit: Payer: Medicare PPO | Admitting: Family Medicine

## 2017-06-20 ENCOUNTER — Encounter: Payer: Self-pay | Admitting: Family Medicine

## 2017-06-20 VITALS — BP 162/96 | HR 74 | Temp 97.4°F | Resp 16 | Wt 190.0 lb

## 2017-06-20 DIAGNOSIS — I10 Essential (primary) hypertension: Secondary | ICD-10-CM | POA: Diagnosis not present

## 2017-06-20 DIAGNOSIS — N210 Calculus in bladder: Secondary | ICD-10-CM | POA: Diagnosis not present

## 2017-06-20 DIAGNOSIS — N529 Male erectile dysfunction, unspecified: Secondary | ICD-10-CM | POA: Diagnosis not present

## 2017-06-20 MED ORDER — LISINOPRIL 20 MG PO TABS: 20.0000 mg | ORAL_TABLET | Freq: Every day | ORAL | 3 refills | Status: DC

## 2017-06-20 NOTE — Progress Notes (Signed)
Patient: Todd Diaz Male    DOB: 1940/09/07   77 y.o.   MRN: [id number] Visit Date: 06/20/2017  Today's Provider: Lavon Paganini, MD   I, Martha Clan, CMA, am acting as scribe for Lavon Paganini, MD.  Chief Complaint  Patient presents with  . Hypertension   Subjective:    HPI      Hypertension, follow-up:  BP Readings from Last 3 Encounters:  06/20/17 (!) 162/96  02/02/17 122/90  01/18/17 122/70    He was last seen for hypertension 5 months ago.  BP at that visit was 122/70. Management since that visit includes continuing medications. He has run out of lisinopril 20 mg. He has not taken this medication in about 1 week. He states his pharmacy keeps contacting his previous PCP, Dr. Reginia Forts, for these refills. He is exercising. He is adherent to low salt diet.   He is experiencing none.  Patient denies chest pain, chest pressure/discomfort, claudication, dyspnea, exertional chest pressure/discomfort, fatigue, irregular heart beat, lower extremity edema, near-syncope, orthopnea, palpitations and syncope.   Cardiovascular risk factors include advanced age (older than 95 for men, 38 for women), diabetes mellitus, dyslipidemia, hypertension and male gender.  Use of agents associated with hypertension: none.     Weight trend: stable Wt Readings from Last 3 Encounters:  06/20/17 190 lb (86.2 kg)  02/02/17 196 lb 3.2 oz (89 kg)  01/18/17 196 lb (88.9 kg)    Current diet: in general, a "healthy" diet    ------------------------------------------------------------------------ Has been followed by Dr. Jacqlyn Larsen [facility]) for many years for ED, prostate stones, bladder stones, etc.  Dr. Jacqlyn Larsen is leaving and patient needs referral to new urologist, preferably local.  No Known Allergies   Current Outpatient Medications:  .  Blood Glucose Calibration (ACCU-CHEK COMPACT PLUS CONTROL) SOLN, Test blood sugar daily. Dx code: E11.9, Disp: 1 each, Rfl: 0 .   blood glucose meter kit and supplies KIT, Dispense based on patient and insurance preference. Check sugar once daily as directed. (FOR ICD-9 250.00, 250.01)., Disp: 1 each, Rfl: 0 .  Blood Glucose Monitoring Suppl (BLOOD GLUCOSE METER KIT AND SUPPLIES) KIT, Test blood sugar daily. Dx code: E11.9, Disp: 1 each, Rfl: 0 .  clopidogrel (PLAVIX) 75 MG tablet, Take 1 tablet (75 mg total) by mouth daily., Disp: 90 tablet, Rfl: 3 .  gabapentin (NEURONTIN) 300 MG capsule, take 1 capsule by mouth at bedtime, Disp: 30 capsule, Rfl: 2 .  glucose blood test strip, Test blood sugar daily. Dx Code: E11.9, Disp: 100 each, Rfl: 3 .  hydrochlorothiazide (HYDRODIURIL) 25 MG tablet, Take 1 tablet (25 mg total) by mouth daily., Disp: 90 tablet, Rfl: 1 .  Lancets MISC, Test blood sugar daily. Dx code: E11.9, Disp: 100 each, Rfl: 3 .  lovastatin (MEVACOR) 20 MG tablet, take 1 tablet by mouth at bedtime, Disp: 90 tablet, Rfl: 1 .  metFORMIN (GLUCOPHAGE) 500 MG tablet, take 1 tablet by mouth twice a day with food, Disp: 180 tablet, Rfl: 1 .  tamsulosin (FLOMAX) 0.4 MG CAPS capsule, Take 0.4 mg by mouth daily after breakfast., Disp: , Rfl:  .  diazepam (VALIUM) 5 MG tablet, Take 1/2 to 1 po daily as needed with onset of panic attack (Patient not taking: Reported on 06/20/2017), Disp: 20 tablet, Rfl: 0 .  lisinopril (PRINIVIL,ZESTRIL) 20 MG tablet, Take 1 tablet (20 mg total) by mouth daily. (Patient not taking: Reported on 06/20/2017), Disp: 30 tablet, Rfl: 5  Review  of Systems  Constitutional: Negative for activity change, appetite change, chills, diaphoresis, fatigue, fever and unexpected weight change.  Respiratory: Negative for shortness of breath.   Cardiovascular: Negative for chest pain, palpitations and leg swelling.    Social History   Tobacco Use  . Smoking status: Former Smoker    Packs/day: 0.50    Years: 20.00    Pack years: 10.00    Types: Cigarettes    Last attempt to quit: 01/09/1986    Years since  quitting: 31.4  . Smokeless tobacco: Current User    Types: Snuff  . Tobacco comment: chews about 1/2 a pack of tobacco currently;   Substance Use Topics  . Alcohol use: Yes    Alcohol/week: 0.0 oz    Comment: occasional 4-5 beers per weekend night.  DUI in 2000.   Objective:   BP (!) 162/96 (BP Location: Left Arm, Patient Position: Sitting, Cuff Size: Large)   Pulse 74   Temp (!) 97.4 F (36.3 C) (Oral)   Resp 16   Wt 190 lb (86.2 kg)   SpO2 95%   BMI 25.77 kg/m  Vitals:   06/20/17 1022  BP: (!) 162/96  Pulse: 74  Resp: 16  Temp: (!) 97.4 F (36.3 C)  TempSrc: Oral  SpO2: 95%  Weight: 190 lb (86.2 kg)     Physical Exam  Constitutional: He is oriented to person, place, and time. He appears well-developed and well-nourished. No distress.  HENT:  Head: Normocephalic and atraumatic.  Eyes: Conjunctivae are normal.  Cardiovascular: Normal rate, regular rhythm, normal heart sounds and intact distal pulses.  No murmur heard. Pulmonary/Chest: Effort normal and breath sounds normal. No respiratory distress. He has no wheezes. He has no rales.  Musculoskeletal: He exhibits no edema.  Neurological: He is alert and oriented to person, place, and time.  Skin: Skin is warm and dry. Capillary refill takes less than 2 seconds. No rash noted.  Psychiatric: He has a normal mood and affect. His behavior is normal.  Vitals reviewed.      Assessment & Plan:   Problem List Items Addressed This Visit      Cardiovascular and Mediastinum   Essential hypertension, benign - Primary    Previously well controlled on current medications, but currently uncontrolled as he has been out of his lisinopril for 1wk Refill of lisinopril sent to pharmacy Advised to call in the future if he is having trouble with refills Reviewed recent BMP F/u in 1 month at CPE as scheduled      Relevant Medications   lisinopril (PRINIVIL,ZESTRIL) 20 MG tablet     Genitourinary   Bladder stone   Relevant  Orders   Ambulatory referral to Urology     Other   Impotence   Relevant Orders   Ambulatory referral to Urology       Return in about 1 month (around 07/18/2017) for Physical as scheduled.   The entirety of the information documented in the History of Present Illness, Review of Systems and Physical Exam were personally obtained by me. Portions of this information were initially documented by Emily Ratchford, CMA and reviewed by me for thoroughness and accuracy.    Bacigalupo, Angela M, MD, MPH  Family Practice 06/20/2017 10:49 AM   

## 2017-06-20 NOTE — Assessment & Plan Note (Signed)
Previously well controlled on current medications, but currently uncontrolled as he has been out of his lisinopril for 1wk Refill of lisinopril sent to pharmacy Advised to call in the future if he is having trouble with refills Reviewed recent BMP F/u in 1 month at CPE as scheduled

## 2017-07-10 ENCOUNTER — Telehealth: Payer: Self-pay

## 2017-07-10 NOTE — Telephone Encounter (Signed)
LMTCB. -MM 

## 2017-07-10 NOTE — Telephone Encounter (Signed)
Moved apt from 1 to 3 on 07/11/17. -MM

## 2017-07-11 ENCOUNTER — Ambulatory Visit (INDEPENDENT_AMBULATORY_CARE_PROVIDER_SITE_OTHER): Payer: Medicare PPO

## 2017-07-11 VITALS — BP 124/68 | HR 68 | Temp 98.5°F | Ht 72.0 in | Wt 190.2 lb

## 2017-07-11 DIAGNOSIS — Z0000 Encounter for general adult medical examination without abnormal findings: Secondary | ICD-10-CM | POA: Diagnosis not present

## 2017-07-11 NOTE — Patient Instructions (Addendum)
Todd Diaz , Thank you for taking time to come for your Medicare Wellness Visit. I appreciate your ongoing commitment to your health goals. Please review the following plan we discussed and let me know if I can assist you in the future.   Screening recommendations/referrals: Colonoscopy: N/A Recommended yearly ophthalmology/optometry visit for glaucoma screening and checkup Recommended yearly dental visit for hygiene and checkup  Vaccinations: Influenza vaccine: Up to date Pneumococcal vaccine: Up to date Tdap vaccine: Up to date Shingles vaccine: Pt declines today.     Advanced directives: Please bring a copy of your POA (Power of Attorney) and/or Living Will to your next appointment.   Conditions/risks identified: Smoking cessation; Diet- recommend to continue cutting out friend foods in daily diet.   Next appointment: 07/25/17 @ 2 PM.  Preventive Care 65 Years and Older, Male Preventive care refers to lifestyle choices and visits with your health care provider that can promote health and wellness. What does preventive care include?  A yearly physical exam. This is also called an annual well check.  Dental exams once or twice a year.  Routine eye exams. Ask your health care provider how often you should have your eyes checked.  Personal lifestyle choices, including:  Daily care of your teeth and gums.  Regular physical activity.  Eating a healthy diet.  Avoiding tobacco and drug use.  Limiting alcohol use.  Practicing safe sex.  Taking low doses of aspirin every day.  Taking vitamin and mineral supplements as recommended by your health care provider. What happens during an annual well check? The services and screenings done by your health care provider during your annual well check will depend on your age, overall health, lifestyle risk factors, and family history of disease. Counseling  Your health care provider may ask you questions about your:  Alcohol  use.  Tobacco use.  Drug use.  Emotional well-being.  Home and relationship well-being.  Sexual activity.  Eating habits.  History of falls.  Memory and ability to understand (cognition).  Work and work Statistician. Screening  You may have the following tests or measurements:  Height, weight, and BMI.  Blood pressure.  Lipid and cholesterol levels. These may be checked every 5 years, or more frequently if you are over 51 years old.  Skin check.  Lung cancer screening. You may have this screening every year starting at age 48 if you have a 30-pack-year history of smoking and currently smoke or have quit within the past 15 years.  Fecal occult blood test (FOBT) of the stool. You may have this test every year starting at age 42.  Flexible sigmoidoscopy or colonoscopy. You may have a sigmoidoscopy every 5 years or a colonoscopy every 10 years starting at age 68.  Prostate cancer screening. Recommendations will vary depending on your family history and other risks.  Hepatitis C blood test.  Hepatitis B blood test.  Sexually transmitted disease (STD) testing.  Diabetes screening. This is done by checking your blood sugar (glucose) after you have not eaten for a while (fasting). You may have this done every 1-3 years.  Abdominal aortic aneurysm (AAA) screening. You may need this if you are a current or former smoker.  Osteoporosis. You may be screened starting at age 71 if you are at high risk. Talk with your health care provider about your test results, treatment options, and if necessary, the need for more tests. Vaccines  Your health care provider may recommend certain vaccines, such as:  Influenza  vaccine. This is recommended every year.  Tetanus, diphtheria, and acellular pertussis (Tdap, Td) vaccine. You may need a Td booster every 10 years.  Zoster vaccine. You may need this after age 64.  Pneumococcal 13-valent conjugate (PCV13) vaccine. One dose is  recommended after age 80.  Pneumococcal polysaccharide (PPSV23) vaccine. One dose is recommended after age 63. Talk to your health care provider about which screenings and vaccines you need and how often you need them. This information is not intended to replace advice given to you by your health care provider. Make sure you discuss any questions you have with your health care provider. Document Released: 01/22/2015 Document Revised: 09/15/2015 Document Reviewed: 10/27/2014 Elsevier Interactive Patient Education  2017 [place] Prevention in the Home Falls can cause injuries. They can happen to people of all ages. There are many things you can do to make your home safe and to help prevent falls. What can I do on the outside of my home?  Regularly fix the edges of walkways and driveways and fix any cracks.  Remove anything that might make you trip as you walk through a door, such as a raised step or threshold.  Trim any bushes or trees on the path to your home.  Use bright outdoor lighting.  Clear any walking paths of anything that might make someone trip, such as rocks or tools.  Regularly check to see if handrails are loose or broken. Make sure that both sides of any steps have handrails.  Any raised decks and porches should have guardrails on the edges.  Have any leaves, snow, or ice cleared regularly.  Use sand or salt on walking paths during winter.  Clean up any spills in your garage right away. This includes oil or grease spills. What can I do in the bathroom?  Use night lights.  Install grab bars by the toilet and in the tub and shower. Do not use towel bars as grab bars.  Use non-skid mats or decals in the tub or shower.  If you need to sit down in the shower, use a plastic, non-slip stool.  Keep the floor dry. Clean up any water that spills on the floor as soon as it happens.  Remove soap buildup in the tub or shower regularly.  Attach bath mats  securely with double-sided non-slip rug tape.  Do not have throw rugs and other things on the floor that can make you trip. What can I do in the bedroom?  Use night lights.  Make sure that you have a light by your bed that is easy to reach.  Do not use any sheets or blankets that are too big for your bed. They should not hang down onto the floor.  Have a firm chair that has side arms. You can use this for support while you get dressed.  Do not have throw rugs and other things on the floor that can make you trip. What can I do in the kitchen?  Clean up any spills right away.  Avoid walking on wet floors.  Keep items that you use a lot in easy-to-reach places.  If you need to reach something above you, use a strong step stool that has a grab bar.  Keep electrical cords out of the way.  Do not use floor polish or wax that makes floors slippery. If you must use wax, use non-skid floor wax.  Do not have throw rugs and other things on the floor that can  make you trip. What can I do with my stairs?  Do not leave any items on the stairs.  Make sure that there are handrails on both sides of the stairs and use them. Fix handrails that are broken or loose. Make sure that handrails are as long as the stairways.  Check any carpeting to make sure that it is firmly attached to the stairs. Fix any carpet that is loose or worn.  Avoid having throw rugs at the top or bottom of the stairs. If you do have throw rugs, attach them to the floor with carpet tape.  Make sure that you have a light switch at the top of the stairs and the bottom of the stairs. If you do not have them, ask someone to add them for you. What else can I do to help prevent falls?  Wear shoes that:  Do not have high heels.  Have rubber bottoms.  Are comfortable and fit you well.  Are closed at the toe. Do not wear sandals.  If you use a stepladder:  Make sure that it is fully opened. Do not climb a closed  stepladder.  Make sure that both sides of the stepladder are locked into place.  Ask someone to hold it for you, if possible.  Clearly mark and make sure that you can see:  Any grab bars or handrails.  First and last steps.  Where the edge of each step is.  Use tools that help you move around (mobility aids) if they are needed. These include:  Canes.  Walkers.  Scooters.  Crutches.  Turn on the lights when you go into a dark area. Replace any light bulbs as soon as they burn out.  Set up your furniture so you have a clear path. Avoid moving your furniture around.  If any of your floors are uneven, fix them.  If there are any pets around you, be aware of where they are.  Review your medicines with your doctor. Some medicines can make you feel dizzy. This can increase your chance of falling. Ask your doctor what other things that you can do to help prevent falls. This information is not intended to replace advice given to you by your health care provider. Make sure you discuss any questions you have with your health care provider. Document Released: 10/22/2008 Document Revised: 06/03/2015 Document Reviewed: 01/30/2014 Elsevier Interactive Patient Education  2017 [employer].

## 2017-07-11 NOTE — Progress Notes (Signed)
Subjective:   Todd Diaz is a 77 y.o. male who presents for Medicare Annual/Subsequent preventive examination.  Review of Systems:  N/A  Cardiac Risk Factors include: advanced age (>71mn, >>55women);diabetes mellitus;dyslipidemia;hypertension;male gender;smoking/ tobacco exposure     Objective:    Vitals: BP 124/68 (BP Location: Right Arm)   Pulse 68   Temp 98.5 F (36.9 C) (Oral)   Ht 6' (1.829 m)   Wt 190 lb 3.2 oz (86.3 kg)   BMI 25.80 kg/m   Body mass index is 25.8 kg/m.  Advanced Directives 07/11/2017 06/15/2015 03/09/2015 01/11/2015 05/05/2014 07/24/2011 07/17/2011  Does Patient Have a Medical Advance Directive? Yes Yes Yes Yes Yes - Patient would like information;Patient does not have advance directive  Type of Advance Directive [place] will [place] ParkLiving will [place]ock SpringsLiving will - [place] HawkLiving will;Advance instruction for mental health treatment;Mental Health Advance Directive - -  Does patient want to make changes to medical advance directive? - - No - Patient declined - No - Patient declined - -  Copy of [place] Chart? No - copy requested No - copy requested Yes No - copy requested Yes - -  Would patient like information on creating a medical advance directive? - - - - - - Advance directive packet given  Pre-existing out of facility DNR order (yellow form or pink MOST form) - - - - - No -    Tobacco Social History   Tobacco Use  Smoking Status Former Smoker  . Packs/day: 0.50  . Years: 20.00  . Pack years: 10.00  . Types: Cigarettes  . Last attempt to quit: 01/09/1986  . Years since quitting: 31.5  Smokeless Tobacco Current User  . Types: Snuff  Tobacco Comment   chews about 1/2 a pack of tobacco currently;      Ready to quit: Not Answered Counseling given: Not Answered Comment: chews about 1/2 a pack of tobacco currently;    Clinical  Intake:  Pre-visit preparation completed: Yes  Pain : No/denies pain Pain Score: 0-No pain     Nutritional Status: BMI 25 -29 Overweight Nutritional Risks: None Diabetes: Yes(type 2) CBG done?: No Did pt. bring in CBG monitor from home?: No  How often do you need to have someone help you when you read instructions, pamphlets, or other written materials from your doctor or pharmacy?: 1 - Never  Interpreter Needed?: No  Information [facility] by :: [facility] L[facility]  Past Medical History:  Diagnosis Date  . Arthritis    Knees  . Avascular necrosis of medial femoral condyle ([place]   . Chronic prostatitis   . Diabetes mellitus ([place]    controlled with medication  . Erectile dysfunction   . Hearing loss   . Hyperlipidemia   . Hypertension    controlled with medication  . Hypertrophy of prostate without urinary obstruction and other lower urinary tract symptoms (LUTS)   . Infection of urinary tract    ASSESSMENT:Recurrent MRSA UTI F/B Dr. BClayborn Bignessand Dr. CJacqlyn Larsen . TIA (transient ischemic attack) 02/23/2014   R sided weakness and numbness; duration 1 hour; evaluated [facility].  . Tobacco use disorder    Past Surgical History:  Procedure Laterality Date  . Admission  03/09/2012   [facility]; 24 hour admission; LE numbness/tingling.  CT head, MRI brain, carotid dopplers, 2D-echo.  . AMPUTATION  2009   L fifth finger surgery for near amputation. [place].  .Marland KitchenBLADDER  STONE REMOVAL    . EYE SURGERY  08/09/2013   B cataract surgery; [place]  . HERNIA REPAIR  04/09/2013   3 repaired; [place].  . JOINT REPLACEMENT    . PARTIAL KNEE ARTHROPLASTY  07/24/2011   Procedure: UNICOMPARTMENTAL KNEE;  Surgeon: Lorn Junes, MD;  Location: [place];  Service: Orthopedics;  Laterality: Left;  . PROSTATE SURGERY    . RETINAL TEAR REPAIR CRYOTHERAPY  08/09/2013   [name]   Family History  Problem Relation Age of Onset  . Hypertension Mother   . Cancer Mother        lung  . Heart attack Father   . Hypertension  Father   . Hyperlipidemia Father   . Aneurysm Father 67       Brain  . Heart disease Father 16       AMI as cause of death  . Diabetes Son   . Healthy Sister    Social History   Socioeconomic History  . Marital status: Widowed    Spouse name: Not on file  . Number of children: 4  . Years of education: 33  . Highest education level: High school graduate  Occupational History  . Occupation: Retired  . Occupation: Engineer, agricultural  Social Needs  . Financial resource strain: Not hard at all  . Food insecurity:    Worry: Never true    Inability: Never true  . Transportation needs:    Medical: No    Non-medical: No  Tobacco Use  . Smoking status: Former Smoker    Packs/day: 0.50    Years: 20.00    Pack years: 10.00    Types: Cigarettes    Last attempt to quit: 01/09/1986    Years since quitting: 31.5  . Smokeless tobacco: Current User    Types: Snuff  . Tobacco comment: chews about 1/2 a pack of tobacco currently;   Substance and Sexual Activity  . Alcohol use: Yes    Alcohol/week: 4.8 - 6.0 oz    Types: 8 - 10 Cans of beer per week    Comment: 4-5 beers per weekend night.  DUI in 2000.  . Drug use: No  . Sexual activity: Not Currently    Partners: Female  Lifestyle  . Physical activity:    Days per week: 0 days    Minutes per session: 0 min  . Stress: Not at all  Relationships  . Social connections:    Talks on phone: Not on file    Gets together: Not on file    Attends religious service: Not on file    Active member of club or organization: Not on file    Attends meetings of clubs or organizations: Not on file    Relationship status: Not on file  Other Topics Concern  . Not on file  Social History Narrative   Widowed since 02/2009 after 69 years second marriage       Children: 6 children, 2 stepchildren; 8 grandchildren, 2 gg.      Lives: with cat, dog.      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      Tobacco: quit smoking age 22; smoked x 22  years.      Alcohol:  Beer every weekend (5-6 beers per night on weekends).      Seatbelt:  Always uses seat belts, Smoke alarm in the home,      Guns: >5 guns in the home loaded and locked up.  Caffeine SHF:WYOVZC, Tea, Carbonated beverages; consumes a moderate amount.      Exercise: Inactive.     Advanced Directives:  +LIVING WILL; FULL CODE, No HCPOA.      ADLs: independent with ADLs.  Drives.    Outpatient Encounter Medications as of 07/11/2017  Medication Sig  . Blood Glucose Calibration (ACCU-CHEK COMPACT PLUS CONTROL) SOLN Test blood sugar daily. Dx code: E11.9  . blood glucose meter kit and supplies KIT Dispense based on patient and insurance preference. Check sugar once daily as directed. (FOR ICD-9 250.00, 250.01).  . Blood Glucose Monitoring Suppl (BLOOD GLUCOSE METER KIT AND SUPPLIES) KIT Test blood sugar daily. Dx code: E11.9  . clopidogrel (PLAVIX) 75 MG tablet Take 1 tablet (75 mg total) by mouth daily.  Marland Kitchen gabapentin (NEURONTIN) 300 MG capsule take 1 capsule by mouth at bedtime  . glucose blood test strip Test blood sugar daily. Dx Code: E11.9  . hydrochlorothiazide (HYDRODIURIL) 25 MG tablet Take 1 tablet (25 mg total) by mouth daily.  . Lancets MISC Test blood sugar daily. Dx code: E11.9  . lisinopril (PRINIVIL,ZESTRIL) 20 MG tablet Take 1 tablet (20 mg total) by mouth daily.  Marland Kitchen lovastatin (MEVACOR) 20 MG tablet take 1 tablet by mouth at bedtime  . metFORMIN (GLUCOPHAGE) 500 MG tablet take 1 tablet by mouth twice a day with food  . tamsulosin (FLOMAX) 0.4 MG CAPS capsule Take 0.4 mg by mouth daily after breakfast.  . diazepam (VALIUM) 5 MG tablet Take 1/2 to 1 po daily as needed with onset of panic attack (Patient not taking: Reported on 06/20/2017)   No facility-administered encounter medications on file as of 07/11/2017.     Activities of Daily Living In your present state of health, do you have any difficulty performing the following activities: 07/11/2017 01/18/2017   Hearing? Y Y  Comment Does not wear hearing aids.  -  Vision? N N  Difficulty concentrating or making decisions? N N  Walking or climbing stairs? N Y  Dressing or bathing? N N  Doing errands, shopping? N N  Preparing Food and eating ? N -  Using the Toilet? N -  In the past six months, have you accidently leaked urine? N -  Do you have problems with loss of bowel control? N -  Managing your Medications? N -  Managing your Finances? N -  Housekeeping or managing your Housekeeping? N -  Some recent data might be hidden    Patient Care Team: Bacigalupo, Dionne Bucy, MD as PCP - General (Family Medicine) Pieter Partridge, DO as Consulting Physician (Neurology) [name], MD as Consulting Physician (Urology) Elsie Saas, MD as Consulting Physician (Orthopedic Surgery)   Assessment:   This is a routine wellness examination for [place].  Exercise Activities and Dietary recommendations Current Exercise Habits: The patient has a physically strenous job, but has no regular exercise apart from work., Exercise limited by: None identified  Goals    . DIET - REDUCE FRIED FOODS       Fall Risk Fall Risk  07/11/2017 02/02/2017 01/18/2017 02/16/2016 02/03/2016  Falls in the past year? No No No No No   Is the patient's home free of loose throw rugs in walkways, pet beds, electrical cords, etc?   yes      Grab bars in the bathroom? yes      Handrails on the stairs?   yes      Adequate lighting?   yes  Timed Get Up and  Go Performed: N/A  Depression Screen PHQ 2/9 Scores 07/11/2017 01/18/2017 02/16/2016 06/15/2015  PHQ - 2 Score 0 0 0 0    Cognitive Function     6CIT Screen 07/11/2017  What Year? 0 points  What month? 0 points  What time? 0 points  Count back from 20 0 points  Months in reverse 2 points  Repeat phrase 4 points  Total Score 6    Immunization History  Administered Date(s) Administered  . DTaP 03/10/2011  . Influenza Split 09/10/2010  . Influenza, High Dose Seasonal PF  01/18/2017  . Influenza,inj,Quad PF,6+ Mos 12/11/2012, 10/13/2013, 01/11/2015, 02/05/2016  . Pneumococcal Conjugate-13 10/13/2013  . Pneumococcal-Unspecified 03/10/2011    Qualifies for Shingles Vaccine? Due for Shingles vaccine. Declined my offer to administer today. Education has been provided regarding the importance of this vaccine. Pt has been advised to call her insurance company to determine her out of pocket expense. Advised she may also receive this vaccine at her local pharmacy or Health Dept. Verbalized acceptance and understanding.  Screening Tests Health Maintenance  Topic Date Due  . OPHTHALMOLOGY EXAM  07/15/2015  . FOOT EXAM  06/14/2016  . HEMOGLOBIN A1C  07/18/2017  . INFLUENZA VACCINE  08/09/2017  . TETANUS/TDAP  12/18/2020  . PNA vac Low Risk Adult  Completed   Cancer Screenings: Lung: Low Dose CT Chest recommended if Age 87-80 years, 30 pack-year currently smoking OR have quit w/in 15years. Patient does not qualify. Colorectal: N/A  Additional Screenings:  Hepatitis C Screening: N/A      Plan:  I have personally reviewed and addressed the Medicare Annual Wellness questionnaire and have noted the following in the patient's chart:  A. Medical and social history B. Use of alcohol, tobacco or illicit drugs  C. Current medications and supplements D. Functional ability and status E.  Nutritional status F.  Physical activity G. Advance directives H. List of other physicians I.  Hospitalizations, surgeries, and ER visits in previous 12 months J.  [place] such as hearing and vision if needed, cognitive and depression L. Referrals and appointments - none  In addition, I have reviewed and discussed with patient certain preventive protocols, quality metrics, and best practice recommendations. A written personalized care plan for preventive services as well as general preventive health recommendations were provided to patient.  See attached scanned  questionnaire for additional information.   Signed,  Fabio Neighbors, LPN Nurse Health Advisor   Nurse Recommendations: Pt needs a foot exam at next OV. Pt plans to set up an eye exam this year to have vision checked.

## 2017-07-25 ENCOUNTER — Encounter: Payer: Self-pay | Admitting: Family Medicine

## 2017-07-25 NOTE — Progress Notes (Deleted)
Patient: Todd Diaz, Male    DOB: 1940/03/26, 77 y.o.   MRN: [id number] Visit Date: 07/25/2017  Today's Provider: Lavon Paganini, MD   No chief complaint on file.  Subjective:     Complete Physical Todd Diaz is a 77 y.o. male. He feels {DESC; WELL/FAIRLY WELL/POORLY:18703}. He reports exercising ***. He reports he is sleeping {DESC; WELL/FAIRLY WELL/POORLY:18703}.  Pt had her AWV with the nurse health advisor on 07/11/2017. -----------------------------------------------------------   Review of Systems  Social History   Socioeconomic History  . Marital status: Widowed    Spouse name: Not on file  . Number of children: 4  . Years of education: 68  . Highest education level: High school graduate  Occupational History  . Occupation: Retired  . Occupation: Engineer, agricultural  Social Needs  . Financial resource strain: Not hard at all  . Food insecurity:    Worry: Never true    Inability: Never true  . Transportation needs:    Medical: No    Non-medical: No  Tobacco Use  . Smoking status: Former Smoker    Packs/day: 0.50    Years: 20.00    Pack years: 10.00    Types: Cigarettes    Last attempt to quit: 01/09/1986    Years since quitting: 31.5  . Smokeless tobacco: Current User    Types: Snuff  . Tobacco comment: chews about 1/2 a pack of tobacco currently;   Substance and Sexual Activity  . Alcohol use: Yes    Alcohol/week: 4.8 - 6.0 oz    Types: 8 - 10 Cans of beer per week    Comment: 4-5 beers per weekend night.  DUI in 2000.  . Drug use: No  . Sexual activity: Not Currently    Partners: Female  Lifestyle  . Physical activity:    Days per week: 0 days    Minutes per session: 0 min  . Stress: Not at all  Relationships  . Social connections:    Talks on phone: Not on file    Gets together: Not on file    Attends religious service: Not on file    Active member of club or organization: Not on file    Attends meetings of clubs or  organizations: Not on file    Relationship status: Not on file  . Intimate partner violence:    Fear of current or ex partner: Not on file    Emotionally abused: Not on file    Physically abused: Not on file    Forced sexual activity: Not on file  Other Topics Concern  . Not on file  Social History Narrative   Widowed since 02/2009 after 60 years second marriage       Children: 6 children, 2 stepchildren; 8 grandchildren, 2 gg.      Lives: with cat, dog.      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      Tobacco: quit smoking age 58; smoked x 22 years.      Alcohol:  Beer every weekend (5-6 beers per night on weekends).      Seatbelt:  Always uses seat belts, Smoke alarm in the home,      Guns: >5 guns in the home loaded and locked up.      Caffeine DHR:CBULAG, Tea, Carbonated beverages; consumes a moderate amount.      Exercise: Inactive.     Advanced Directives:  +LIVING WILL; FULL CODE, No HCPOA.  ADLs: independent with ADLs.  Drives.    Past Medical History:  Diagnosis Date  . Arthritis    Knees  . Avascular necrosis of medial femoral condyle ([place])   . Chronic prostatitis   . Diabetes mellitus ([place])    controlled with medication  . Erectile dysfunction   . Hearing loss   . Hyperlipidemia   . Hypertension    controlled with medication  . Hypertrophy of prostate without urinary obstruction and other lower urinary tract symptoms (LUTS)   . Infection of urinary tract    ASSESSMENT:Recurrent MRSA UTI F/B Dr. Clayborn Bigness and Dr. Jacqlyn Larsen  . TIA (transient ischemic attack) 02/23/2014   R sided weakness and numbness; duration 1 hour; evaluated [facility].  . Tobacco use disorder      Patient Active Problem List   Diagnosis Date Noted  . Degenerative disc disease, lumbar 01/12/2015  . Type 2 diabetes mellitus without complication, without long-term current use of insulin ([place]) 01/12/2015  . History of TIA (transient ischemic attack) 01/12/2015  . Right leg numbness  08/18/2014  . Bilateral inguinal hernia (BIH) s/p lap reapir w mesh 03/03/2013 02/26/2013  . Panic attack 05/20/2012  . Bladder stone 03/25/2012  . Essential hypertension, benign 12/26/2011  . Impotence 12/26/2011  . Pure hypercholesterolemia 12/26/2011  . Avascular necrosis of medial femoral condyle [facility])     Past Surgical History:  Procedure Laterality Date  . Admission  03/09/2012   [facility]; 24 hour admission; LE numbness/tingling.  CT head, MRI brain, carotid dopplers, 2D-echo.  . AMPUTATION  2009   L fifth finger surgery for near amputation. [place].  Marland Kitchen BLADDER STONE REMOVAL    . EYE SURGERY  08/09/2013   B cataract surgery; [place]  . HERNIA REPAIR  04/09/2013   3 repaired; [place].  . JOINT REPLACEMENT    . PARTIAL KNEE ARTHROPLASTY  07/24/2011   Procedure: UNICOMPARTMENTAL KNEE;  Surgeon: Lorn Junes, MD;  Location: [place];  Service: Orthopedics;  Laterality: Left;  . PROSTATE SURGERY    . RETINAL TEAR REPAIR CRYOTHERAPY  08/09/2013   [name]    His family history includes Aneurysm (age of onset: 65) in his father; Cancer in his mother; Diabetes in his son; Healthy in his sister; Heart attack in his father; Heart disease (age of onset: 51) in his father; Hyperlipidemia in his father; Hypertension in his father and mother.      Current Outpatient Medications:  .  Blood Glucose Calibration (ACCU-CHEK COMPACT PLUS CONTROL) SOLN, Test blood sugar daily. Dx code: E11.9, Disp: 1 each, Rfl: 0 .  blood glucose meter kit and supplies KIT, Dispense based on patient and insurance preference. Check sugar once daily as directed. (FOR ICD-9 250.00, 250.01)., Disp: 1 each, Rfl: 0 .  Blood Glucose Monitoring Suppl (BLOOD GLUCOSE METER KIT AND SUPPLIES) KIT, Test blood sugar daily. Dx code: E11.9, Disp: 1 each, Rfl: 0 .  clopidogrel (PLAVIX) 75 MG tablet, Take 1 tablet (75 mg total) by mouth daily., Disp: 90 tablet, Rfl: 3 .  diazepam (VALIUM) 5 MG tablet, Take 1/2 to 1 po daily as needed with onset  of panic attack (Patient not taking: Reported on 06/20/2017), Disp: 20 tablet, Rfl: 0 .  gabapentin (NEURONTIN) 300 MG capsule, take 1 capsule by mouth at bedtime, Disp: 30 capsule, Rfl: 2 .  glucose blood test strip, Test blood sugar daily. Dx Code: E11.9, Disp: 100 each, Rfl: 3 .  hydrochlorothiazide (HYDRODIURIL) 25 MG tablet, Take 1 tablet (25 mg total) by mouth  daily., Disp: 90 tablet, Rfl: 1 .  Lancets MISC, Test blood sugar daily. Dx code: E11.9, Disp: 100 each, Rfl: 3 .  lisinopril (PRINIVIL,ZESTRIL) 20 MG tablet, Take 1 tablet (20 mg total) by mouth daily., Disp: 90 tablet, Rfl: 3 .  lovastatin (MEVACOR) 20 MG tablet, take 1 tablet by mouth at bedtime, Disp: 90 tablet, Rfl: 1 .  metFORMIN (GLUCOPHAGE) 500 MG tablet, take 1 tablet by mouth twice a day with food, Disp: 180 tablet, Rfl: 1 .  tamsulosin (FLOMAX) 0.4 MG CAPS capsule, Take 0.4 mg by mouth daily after breakfast., Disp: , Rfl:   Patient Care Team: Virginia Crews, MD as PCP - General (Family Medicine) Pieter Partridge, DO as Consulting Physician (Neurology) [name], MD as Consulting Physician (Urology) Elsie Saas, MD as Consulting Physician (Orthopedic Surgery) Anell Barr, OD as Consulting Physician (Optometry)     Objective:   Vitals: There were no vitals taken for this visit.  Physical Exam  Activities of Daily Living In your present state of health, do you have any difficulty performing the following activities: 07/11/2017 01/18/2017  Hearing? Y Y  Comment Does not wear hearing aids.  -  Vision? N N  Difficulty concentrating or making decisions? N N  Walking or climbing stairs? N Y  Dressing or bathing? N N  Doing errands, shopping? N N  Preparing Food and eating ? N -  Using the Toilet? N -  In the past six months, have you accidently leaked urine? N -  Do you have problems with loss of bowel control? N -  Managing your Medications? N -  Managing your Finances? N -  Housekeeping or managing  your Housekeeping? N -  Some recent data might be hidden    Fall Risk Assessment Fall Risk  07/11/2017 02/02/2017 01/18/2017 02/16/2016 02/03/2016  Falls in the past year? _0      Depression Screen PHQ 2/9 Scores 07/11/2017 01/18/2017 02/16/2016 06/15/2015  PHQ - 2 Score 0 0 0 0    6CIT Screen 07/11/2017  What Year? 0 points  What month? 0 points  What time? 0 points  Count back from 20 0 points  Months in reverse 2 points  Repeat phrase 4 points  Total Score 6     Assessment & Plan:    Annual Physical Reviewed patient's Family Medical History Reviewed and updated list of patient's medical providers Assessment of cognitive impairment was done Assessed patient's functional ability Established a written schedule for health screening [place] Completed and Reviewed  Exercise Activities and Dietary recommendations Goals    . DIET - REDUCE FRIED FOODS       Immunization History  Administered Date(s) Administered  . DTaP 03/10/2011  . Influenza Split 09/10/2010  . Influenza, High Dose Seasonal PF 01/18/2017  . Influenza,inj,Quad PF,6+ Mos 12/11/2012, 10/13/2013, 01/11/2015, 02/05/2016  . Pneumococcal Conjugate-13 10/13/2013  . Pneumococcal-Unspecified 03/10/2011    Health Maintenance  Topic Date Due  . OPHTHALMOLOGY EXAM  07/15/2015  . FOOT EXAM  06/14/2016  . HEMOGLOBIN A1C  07/18/2017  . INFLUENZA VACCINE  08/09/2017  . TETANUS/TDAP  12/18/2020  . PNA vac Low Risk Adult  Completed     Discussed health benefits of physical activity, and encouraged him to engage in regular exercise appropriate for his age and condition.    ------------------------------------------------------------------------------------------------------------

## 2017-08-23 ENCOUNTER — Encounter: Payer: Self-pay | Admitting: Family Medicine

## 2017-08-30 ENCOUNTER — Ambulatory Visit: Payer: Medicare PPO | Admitting: Urology

## 2017-09-05 ENCOUNTER — Other Ambulatory Visit: Payer: Self-pay | Admitting: Neurology

## 2017-10-09 ENCOUNTER — Ambulatory Visit: Payer: Medicare PPO | Admitting: Urology

## 2017-10-10 ENCOUNTER — Other Ambulatory Visit: Payer: Self-pay | Admitting: Family Medicine

## 2017-10-16 ENCOUNTER — Other Ambulatory Visit: Payer: Self-pay | Admitting: Family Medicine

## 2017-10-16 ENCOUNTER — Other Ambulatory Visit: Payer: Self-pay | Admitting: Physician Assistant

## 2017-10-17 NOTE — Telephone Encounter (Signed)
Dr. B can you please review it looks like Tawanna Sat refilled this on 10/11/17. Okay to disregard message? KW

## 2017-10-30 ENCOUNTER — Other Ambulatory Visit: Payer: Self-pay | Admitting: Family Medicine

## 2017-11-02 ENCOUNTER — Encounter: Payer: Self-pay | Admitting: Family Medicine

## 2017-11-02 ENCOUNTER — Ambulatory Visit (INDEPENDENT_AMBULATORY_CARE_PROVIDER_SITE_OTHER): Payer: Medicare PPO | Admitting: Family Medicine

## 2017-11-02 VITALS — BP 132/60 | HR 81 | Temp 97.7°F | Resp 16 | Ht 72.0 in | Wt 190.2 lb

## 2017-11-02 DIAGNOSIS — E7800 Pure hypercholesterolemia, unspecified: Secondary | ICD-10-CM

## 2017-11-02 DIAGNOSIS — E119 Type 2 diabetes mellitus without complications: Secondary | ICD-10-CM

## 2017-11-02 DIAGNOSIS — Z23 Encounter for immunization: Secondary | ICD-10-CM

## 2017-11-02 DIAGNOSIS — Z0000 Encounter for general adult medical examination without abnormal findings: Secondary | ICD-10-CM

## 2017-11-02 DIAGNOSIS — I10 Essential (primary) hypertension: Secondary | ICD-10-CM

## 2017-11-02 NOTE — Patient Instructions (Addendum)
Call and schedule an eye exam.  Make sure they know you have diabetes. And have records sent to our office   Preventive Care 79 Years and Older, Male Preventive care refers to lifestyle choices and visits with your health care provider that can promote health and wellness. What does preventive care include?  A yearly physical exam. This is also called an annual well check.  Dental exams once or twice a year.  Routine eye exams. Ask your health care provider how often you should have your eyes checked.  Personal lifestyle choices, including: ? Daily care of your teeth and gums. ? Regular physical activity. ? Eating a healthy diet. ? Avoiding tobacco and drug use. ? Limiting alcohol use. ? Practicing safe sex. ? Taking low doses of aspirin every day. ? Taking vitamin and mineral supplements as recommended by your health care provider. What happens during an annual well check? The services and screenings done by your health care provider during your annual well check will depend on your age, overall health, lifestyle risk factors, and family history of disease. Counseling Your health care provider may ask you questions about your:  Alcohol use.  Tobacco use.  Drug use.  Emotional well-being.  Home and relationship well-being.  Sexual activity.  Eating habits.  History of falls.  Memory and ability to understand (cognition).  Work and work Statistician.  Screening You may have the following tests or measurements:  Height, weight, and BMI.  Blood pressure.  Lipid and cholesterol levels. These may be checked every 5 years, or more frequently if you are over 79 years old.  Skin check.  Lung cancer screening. You may have this screening every year starting at age 79 if you have a 30-pack-year history of smoking and currently smoke or have quit within the past 15 years.  Fecal occult blood test (FOBT) of the stool. You may have this test every year starting at age  79.  Flexible sigmoidoscopy or colonoscopy. You may have a sigmoidoscopy every 5 years or a colonoscopy every 10 years starting at age 63.  Prostate cancer screening. Recommendations will vary depending on your family history and other risks.  Hepatitis C blood test.  Hepatitis B blood test.  Sexually transmitted disease (STD) testing.  Diabetes screening. This is done by checking your blood sugar (glucose) after you have not eaten for a while (fasting). You may have this done every 1-3 years.  Abdominal aortic aneurysm (AAA) screening. You may need this if you are a current or former smoker.  Osteoporosis. You may be screened starting at age 79 if you are at high risk.  Talk with your health care provider about your test results, treatment options, and if necessary, the need for more tests. Vaccines Your health care provider may recommend certain vaccines, such as:  Influenza vaccine. This is recommended every year.  Tetanus, diphtheria, and acellular pertussis (Tdap, Td) vaccine. You may need a Td booster every 10 years.  Varicella vaccine. You may need this if you have not been vaccinated.  Zoster vaccine. You may need this after age 79.  Measles, mumps, and rubella (MMR) vaccine. You may need at least one dose of MMR if you were born in 1957 or later. You may also need a second dose.  Pneumococcal 13-valent conjugate (PCV13) vaccine. One dose is recommended after age 79.  Pneumococcal polysaccharide (PPSV23) vaccine. One dose is recommended after age 79.  Meningococcal vaccine. You may need this if you have certain conditions.  Hepatitis A vaccine. You may need this if you have certain conditions or if you travel or work in places where you may be exposed to hepatitis A.  Hepatitis B vaccine. You may need this if you have certain conditions or if you travel or work in places where you may be exposed to hepatitis B.  Haemophilus influenzae type b (Hib) vaccine. You may  need this if you have certain risk factors.  Talk to your health care provider about which screenings and vaccines you need and how often you need them. This information is not intended to replace advice given to you by your health care provider. Make sure you discuss any questions you have with your health care provider. Document Released: 01/22/2015 Document Revised: 09/15/2015 Document Reviewed: 10/27/2014 Elsevier Interactive Patient Education  [employer].

## 2017-11-02 NOTE — Progress Notes (Signed)
Patient: Todd Diaz, Male    DOB: 12/23/1940, 77 y.o.   MRN: [id number] Visit Date: 11/02/2017  Today's Provider: Lavon Paganini, MD   Chief Complaint  Patient presents with  . Annual Exam   Subjective:   Patient had a AWE with [facility] on 07/11/2017   Complete Physical Todd Diaz is a 77 y.o. male. He feels well. He reports exercising none, patient has a physically strenous job. He reports he is sleeping well.   Last Eye Exam:not up to date.  Followed by Urology for his prostate  Wants flu shot today  -----------------------------------------------------------   Review of Systems  Constitutional: Negative.   HENT: Negative.   Eyes: Negative.   Respiratory: Negative.   Cardiovascular: Negative.  Negative for chest pain, palpitations and leg swelling.  Gastrointestinal: Negative.   Endocrine: Negative.   Genitourinary: Negative.   Musculoskeletal: Negative.   Skin: Negative.   Allergic/Immunologic: Negative.   Neurological: Negative.  Negative for headaches.  Hematological: Negative.   Psychiatric/Behavioral: Negative.     Social History   Socioeconomic History  . Marital status: Widowed    Spouse name: Not on file  . Number of children: 4  . Years of education: 40  . Highest education level: High school graduate  Occupational History  . Occupation: Retired  . Occupation: Engineer, agricultural  Social Needs  . Financial resource strain: Not hard at all  . Food insecurity:    Worry: Never true    Inability: Never true  . Transportation needs:    Medical: No    Non-medical: No  Tobacco Use  . Smoking status: Former Smoker    Packs/day: 0.50    Years: 20.00    Pack years: 10.00    Types: Cigarettes    Last attempt to quit: 01/09/1986    Years since quitting: 31.8  . Smokeless tobacco: Current User    Types: Snuff  . Tobacco comment: chews about 1/2 a pack of tobacco currently;   Substance and Sexual Activity  . Alcohol use: Yes   Alcohol/week: 8.0 - 10.0 standard drinks    Types: 8 - 10 Cans of beer per week    Comment: 4-5 beers per weekend night.  DUI in 2000.  . Drug use: No  . Sexual activity: Not Currently    Partners: Female  Lifestyle  . Physical activity:    Days per week: 0 days    Minutes per session: 0 min  . Stress: Not at all  Relationships  . Social connections:    Talks on phone: Not on file    Gets together: Not on file    Attends religious service: Not on file    Active member of club or organization: Not on file    Attends meetings of clubs or organizations: Not on file    Relationship status: Not on file  . Intimate partner violence:    Fear of current or ex partner: Not on file    Emotionally abused: Not on file    Physically abused: Not on file    Forced sexual activity: Not on file  Other Topics Concern  . Not on file  Social History Narrative   Widowed since 02/2009 after 66 years second marriage       Children: 6 children, 2 stepchildren; 8 grandchildren, 2 gg.      Lives: with cat, dog.      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  Tobacco: quit smoking age 51; smoked x 22 years.      Alcohol:  Beer every weekend (5-6 beers per night on weekends).      Seatbelt:  Always uses seat belts, Smoke alarm in the home,      Guns: >5 guns in the home loaded and locked up.      Caffeine HYW:VPXTGG, Tea, Carbonated beverages; consumes a moderate amount.      Exercise: Inactive.     Advanced Directives:  +LIVING WILL; FULL CODE, No HCPOA.      ADLs: independent with ADLs.  Drives.    Past Medical History:  Diagnosis Date  . Arthritis    Knees  . Avascular necrosis of medial femoral condyle ([place])   . Chronic prostatitis   . Diabetes mellitus ([place])    controlled with medication  . Erectile dysfunction   . Hearing loss   . Hyperlipidemia   . Hypertension    controlled with medication  . Hypertrophy of prostate without urinary obstruction and other lower urinary  tract symptoms (LUTS)   . Infection of urinary tract    ASSESSMENT:Recurrent MRSA UTI F/B Dr. Clayborn Bigness and Dr. Jacqlyn Larsen  . TIA (transient ischemic attack) 02/23/2014   R sided weakness and numbness; duration 1 hour; evaluated [facility].  . Tobacco use disorder      Patient Active Problem List   Diagnosis Date Noted  . Degenerative disc disease, lumbar 01/12/2015  . Type 2 diabetes mellitus without complication, without long-term current use of insulin ([place]) 01/12/2015  . History of TIA (transient ischemic attack) 01/12/2015  . Right leg numbness 08/18/2014  . Bilateral inguinal hernia (BIH) s/p lap reapir w mesh 03/03/2013 02/26/2013  . Panic attack 05/20/2012  . Bladder stone 03/25/2012  . Essential hypertension, benign 12/26/2011  . Impotence 12/26/2011  . Pure hypercholesterolemia 12/26/2011  . Avascular necrosis of medial femoral condyle [facility])     Past Surgical History:  Procedure Laterality Date  . Admission  03/09/2012   [facility]; 24 hour admission; LE numbness/tingling.  CT head, MRI brain, carotid dopplers, 2D-echo.  . AMPUTATION  2009   L fifth finger surgery for near amputation. [facility].  [facility] Kitchen BLADDER STONE REMOVAL    . EYE SURGERY  08/09/2013   B cataract surgery; [facility]  . HERNIA REPAIR  04/09/2013   3 repaired; [facility].  . JOINT REPLACEMENT    . PARTIAL KNEE ARTHROPLASTY  07/24/2011   Procedure: UNICOMPARTMENTAL KNEE;  Surgeon: Lorn Junes, MD;  Location: [facility];  Service: Orthopedics;  Laterality: Left;  . PROSTATE SURGERY    . RETINAL TEAR REPAIR CRYOTHERAPY  08/09/2013   [name]    His family history includes Aneurysm (age of onset: 81) in his father; Cancer in his mother; Diabetes in his son; Healthy in his sister; Heart attack in his father; Heart disease (age of onset: 10) in his father; Hyperlipidemia in his father; Hypertension in his father and mother.      Current Outpatient Medications:  .  Blood Glucose Calibration (ACCU-CHEK COMPACT PLUS CONTROL) SOLN, Test blood sugar  daily. Dx code: E11.9, Disp: 1 each, Rfl: 0 .  blood glucose meter kit and supplies KIT, Dispense based on patient and insurance preference. Check sugar once daily as directed. (FOR ICD-9 250.00, 250.01)., Disp: 1 each, Rfl: 0 .  Blood Glucose Monitoring Suppl (BLOOD GLUCOSE METER KIT AND SUPPLIES) KIT, Test blood sugar daily. Dx code: E11.9, Disp: 1 each, Rfl: 0 .  clopidogrel (PLAVIX) 75 MG tablet, TAKE 1  TABLET BY MOUTH ONCE DAILY, Disp: 90 tablet, Rfl: 1 .  gabapentin (NEURONTIN) 300 MG capsule, take 1 capsule by mouth at bedtime, Disp: 30 capsule, Rfl: 2 .  glucose blood test strip, Test blood sugar daily. Dx Code: E11.9, Disp: 100 each, Rfl: 3 .  hydrochlorothiazide (HYDRODIURIL) 25 MG tablet, TAKE 1 TABLET BY MOUTH EVERY DAY, Disp: 90 tablet, Rfl: 0 .  Lancets MISC, Test blood sugar daily. Dx code: E11.9, Disp: 100 each, Rfl: 3 .  lisinopril (PRINIVIL,ZESTRIL) 20 MG tablet, TAKE 1 TABLET BY MOUTH ONCE DAILY, Disp: 90 tablet, Rfl: 3 .  lovastatin (MEVACOR) 20 MG tablet, take 1 tablet by mouth at bedtime, Disp: 90 tablet, Rfl: 1 .  metFORMIN (GLUCOPHAGE) 500 MG tablet, take 1 tablet by mouth twice a day with food, Disp: 180 tablet, Rfl: 1 .  tamsulosin (FLOMAX) 0.4 MG CAPS capsule, Take 0.4 mg by mouth daily after breakfast., Disp: , Rfl:  .  diazepam (VALIUM) 5 MG tablet, Take 1/2 to 1 po daily as needed with onset of panic attack (Patient not taking: Reported on 06/20/2017), Disp: 20 tablet, Rfl: 0  Patient Care Team: Virginia Crews, MD as PCP - General (Family Medicine) Pieter Partridge, DO as Consulting Physician (Neurology) [name], MD as Consulting Physician (Urology) Elsie Saas, MD as Consulting Physician (Orthopedic Surgery) Anell Barr, OD as Consulting Physician (Optometry)     Objective:   Vitals: BP 132/60 (BP Location: Right Arm, Patient Position: Sitting, Cuff Size: Normal)   Pulse 81   Temp 97.7 F (36.5 C) (Oral)   Resp 16   Ht 6' (1.829 m)   Wt  190 lb 3.2 oz (86.3 kg)   BMI 25.80 kg/m   Physical Exam  Constitutional: He is oriented to person, place, and time. He appears well-developed and well-nourished. No distress.  HENT:  Head: Normocephalic and atraumatic.  Right Ear: External ear normal.  Left Ear: External ear normal.  Nose: Nose normal.  Mouth/Throat: Oropharynx is clear and moist.  Eyes: Pupils are equal, round, and reactive to light. EOM are normal. No scleral icterus.  Neck: Normal range of motion. Neck supple. No thyromegaly present.  Cardiovascular: Normal rate, regular rhythm, normal heart sounds and intact distal pulses.  No murmur heard. Pulmonary/Chest: Effort normal and breath sounds normal. No respiratory distress. He has no wheezes. He has no rales.  Abdominal: Soft. Bowel sounds are normal. He exhibits no distension. There is no tenderness. There is no rebound and no guarding.  Musculoskeletal: Normal range of motion. He exhibits no edema or deformity.  Lymphadenopathy:    He has no cervical adenopathy.  Neurological: He is alert and oriented to person, place, and time.  Skin: Skin is warm and dry. Capillary refill takes less than 2 seconds. No rash noted.  Psychiatric: He has a normal mood and affect. His behavior is normal. Judgment and thought content normal.  Vitals reviewed.   Activities of Daily Living In your present state of health, do you have any difficulty performing the following activities: 07/11/2017 01/18/2017  Hearing? Y Y  Comment Does not wear hearing aids.  -  Vision? N N  Difficulty concentrating or making decisions? N N  Walking or climbing stairs? N Y  Dressing or bathing? N N  Doing errands, shopping? N N  Preparing Food and eating ? N -  Using the Toilet? N -  In the past six months, have you accidently leaked urine? N -  Do you  have problems with loss of bowel control? N -  Managing your Medications? N -  Managing your Finances? N -  Housekeeping or managing your  Housekeeping? N -  Some recent data might be hidden    Fall Risk Assessment Fall Risk  07/11/2017 02/02/2017 01/18/2017 02/16/2016 02/03/2016  Falls in the past year? _0      Depression Screen PHQ 2/9 Scores 07/11/2017 01/18/2017 02/16/2016 06/15/2015  PHQ - 2 Score 0 0 0 0    6CIT Screen 07/11/2017  What Year? 0 points  What month? 0 points  What time? 0 points  Count back from 20 0 points  Months in reverse 2 points  Repeat phrase 4 points  Total Score 6       Assessment & Plan:    Annual Physical Reviewed patient's Family Medical History Reviewed and updated list of patient's medical providers Assessment of cognitive impairment was done Assessed patient's functional ability Established a written schedule for health screening [place] Completed and Reviewed  Exercise Activities and Dietary recommendations Goals    . DIET - REDUCE FRIED FOODS       Immunization History  Administered Date(s) Administered  . DTaP 03/10/2011  . Influenza Split 09/10/2010  . Influenza, High Dose Seasonal PF 01/18/2017  . Influenza,inj,Quad PF,6+ Mos 12/11/2012, 10/13/2013, 01/11/2015, 02/05/2016  . Pneumococcal Conjugate-13 10/13/2013  . Pneumococcal-Unspecified 03/10/2011    Health Maintenance  Topic Date Due  . OPHTHALMOLOGY EXAM  07/15/2015  . FOOT EXAM  06/14/2016  . HEMOGLOBIN A1C  07/18/2017  . INFLUENZA VACCINE  08/09/2017  . TETANUS/TDAP  12/18/2020  . PNA vac Low Risk Adult  Completed     Discussed health benefits of physical activity, and encouraged him to engage in regular exercise appropriate for his age and condition.    ------------------------------------------------------------------------------------------------------------  Problem List Items Addressed This Visit      Cardiovascular and Mediastinum   Essential hypertension, benign     Endocrine   Type 2 diabetes mellitus without complication, without long-term current use of  insulin (HCC)   Relevant Orders   Hemoglobin A1c     Other   Pure hypercholesterolemia    Other Visit Diagnoses    Encounter for annual physical exam    -  Primary   Relevant Orders   Comprehensive metabolic panel   Need for influenza vaccination       Relevant Orders   Flu vaccine HIGH DOSE PF (Completed)       Return in about 6 months (around 05/04/2018) for chronic disease f/u.   The entirety of the information documented in the History of Present Illness, Review of Systems and Physical Exam were personally obtained by me. Portions of this information were initially documented by Lyndel Pleasure, CMA and reviewed by me for thoroughness and accuracy.    Virginia Crews, MD, MPH [facility] 11/02/2017 4:06 PM

## 2017-11-03 LAB — COMPREHENSIVE METABOLIC PANEL
ALT: 22 IU/L (ref 0–44)
AST: 24 IU/L (ref 0–40)
Albumin/Globulin Ratio: 1.6 (ref 1.2–2.2)
Albumin: 4.3 g/dL (ref 3.5–4.8)
Alkaline Phosphatase: 75 IU/L (ref 39–117)
BUN/Creatinine Ratio: 13 (ref 10–24)
BUN: 12 mg/dL (ref 8–27)
Bilirubin Total: 0.3 mg/dL (ref 0.0–1.2)
CO2: 23 mmol/L (ref 20–29)
Calcium: 9.9 mg/dL (ref 8.6–10.2)
Chloride: 98 mmol/L (ref 96–106)
Creatinine, Ser: 0.91 mg/dL (ref 0.76–1.27)
GFR calc Af Amer: 93 mL/min/{1.73_m2} (ref >59)
GFR calc non Af Amer: 80 mL/min/{1.73_m2} (ref >59)
Globulin, Total: 2.7 g/dL (ref 1.5–4.5)
Glucose: 106 mg/dL — ABNORMAL HIGH (ref 65–99)
Potassium: 4.1 mmol/L (ref 3.5–5.2)
Sodium: 138 mmol/L (ref 134–144)
Total Protein: 7 g/dL (ref 6.0–8.5)

## 2017-11-03 LAB — HEMOGLOBIN A1C
Est. average glucose Bld gHb Est-mCnc: 117 mg/dL
Hgb A1c MFr Bld: 5.7 % — ABNORMAL HIGH (ref 4.8–5.6)

## 2017-11-05 ENCOUNTER — Other Ambulatory Visit: Payer: Self-pay | Admitting: Neurology

## 2017-11-21 ENCOUNTER — Ambulatory Visit: Payer: Medicare PPO | Admitting: Urology

## 2017-11-21 ENCOUNTER — Encounter

## 2017-11-21 ENCOUNTER — Encounter: Payer: Self-pay | Admitting: Urology

## 2017-11-21 VITALS — BP 120/84 | HR 101 | Ht 72.0 in | Wt 190.0 lb

## 2017-11-21 DIAGNOSIS — E1159 Type 2 diabetes mellitus with other circulatory complications: Secondary | ICD-10-CM | POA: Insufficient documentation

## 2017-11-21 DIAGNOSIS — N401 Enlarged prostate with lower urinary tract symptoms: Secondary | ICD-10-CM

## 2017-11-21 DIAGNOSIS — Z720 Tobacco use: Secondary | ICD-10-CM | POA: Insufficient documentation

## 2017-11-21 DIAGNOSIS — N138 Other obstructive and reflux uropathy: Secondary | ICD-10-CM

## 2017-11-21 DIAGNOSIS — E785 Hyperlipidemia, unspecified: Secondary | ICD-10-CM | POA: Insufficient documentation

## 2017-11-21 DIAGNOSIS — H269 Unspecified cataract: Secondary | ICD-10-CM | POA: Insufficient documentation

## 2017-11-21 DIAGNOSIS — I10 Essential (primary) hypertension: Secondary | ICD-10-CM | POA: Insufficient documentation

## 2017-11-21 NOTE — Progress Notes (Signed)
11/21/2017 7:35 PM   Todd Diaz [birth year] [id number]  Referring provider: Virginia Crews, MD [address] [place], [place] [address]  Chief Complaint  Patient presents with  . Establish Care   Urologic history: 1.  BPH with lower urinary tract symptoms -Prior TURP several years ago -On tamsulosin with stable LUTS  2.  History of bladder calculus  3.  Erectile dysfunction   HPI: 77 year old male presents to establish local urologic care.  He has previously been followed by Dr. Jacqlyn Larsen.  He has had a TURP in the remote past and a prior cystolitholapaxy.  He remains on tamsulosin and denies bothersome lower urinary tract symptoms.  He denies dysuria or gross hematuria.  He denies flank/abdominal/pelvic or scrotal pain.  He does have a history of phimosis and is contemplating circumcision in the near future.  He has been on sildenafil in the past for ED however his wife died approximately 2 years ago and he is currently not sexually active.   PMH: Past Medical History:  Diagnosis Date  . Arthritis    Knees  . Avascular necrosis of medial femoral condyle ([place])   . Chronic prostatitis   . Diabetes mellitus ([place])    controlled with medication  . Erectile dysfunction   . Hearing loss   . Hyperlipidemia   . Hypertension    controlled with medication  . Hypertrophy of prostate without urinary obstruction and other lower urinary tract symptoms (LUTS)   . Infection of urinary tract    ASSESSMENT:Recurrent MRSA UTI F/B Dr. Clayborn Bigness and Dr. Jacqlyn Larsen  . TIA (transient ischemic attack) 02/23/2014   R sided weakness and numbness; duration 1 hour; evaluated [facility].  . Tobacco use disorder     Surgical History: Past Surgical History:  Procedure Laterality Date  . Admission  03/09/2012   [facility]; 24 hour admission; LE numbness/tingling.  CT head, MRI brain, carotid dopplers, 2D-echo.  . AMPUTATION  2009   L fifth finger surgery for near amputation. [place].  Marland Kitchen BLADDER STONE  REMOVAL    . EYE SURGERY  08/09/2013   B cataract surgery; [place]  . HERNIA REPAIR  04/09/2013   3 repaired; [place].  . JOINT REPLACEMENT    . PARTIAL KNEE ARTHROPLASTY  07/24/2011   Procedure: UNICOMPARTMENTAL KNEE;  Surgeon: Lorn Junes, MD;  Location: [facility];  Service: Orthopedics;  Laterality: Left;  . PROSTATE SURGERY    . RETINAL TEAR REPAIR CRYOTHERAPY  08/09/2013   [name]    Home Medications:  Allergies as of 11/21/2017   No Known Allergies     Medication List        Accurate as of 11/21/17  7:35 PM. Always use your most recent med list.          ACCU-CHEK COMPACT PLUS CONTROL Soln Test blood sugar daily. Dx code: E11.9   blood glucose meter kit and supplies Kit Test blood sugar daily. Dx code: E11.9   blood glucose meter kit and supplies Kit Dispense based on patient and insurance preference. Check sugar once daily as directed. (FOR ICD-9 250.00, 250.01).   clopidogrel 75 MG tablet Commonly known as:  PLAVIX TAKE 1 TABLET BY MOUTH ONCE DAILY   diazepam 5 MG tablet Commonly known as:  VALIUM Take 1/2 to 1 po daily as needed with onset of panic attack   gabapentin 300 MG capsule Commonly known as:  NEURONTIN TAKE 1 CAPSULE BY MOUTH AT BEDTIME   glucose blood test strip Test blood  sugar daily. Dx Code: E11.9   hydrochlorothiazide 25 MG tablet Commonly known as:  HYDRODIURIL TAKE 1 TABLET BY MOUTH EVERY DAY   Lancets Misc Test blood sugar daily. Dx code: E11.9   lisinopril 20 MG tablet Commonly known as:  PRINIVIL,ZESTRIL TAKE 1 TABLET BY MOUTH ONCE DAILY   lovastatin 20 MG tablet Commonly known as:  MEVACOR take 1 tablet by mouth at bedtime   metFORMIN 500 MG tablet Commonly known as:  GLUCOPHAGE take 1 tablet by mouth twice a day with food   tamsulosin 0.4 MG Caps capsule Commonly known as:  FLOMAX Take 0.4 mg by mouth daily after breakfast.       Allergies: No Known Allergies  Family History: Family History  Problem Relation Age of  Onset  . Hypertension Mother   . Cancer Mother        lung  . Heart attack Father   . Hypertension Father   . Hyperlipidemia Father   . Aneurysm Father 58       Brain  . Heart disease Father 15       AMI as cause of death  . Diabetes Son   . Healthy Sister     Social History:  reports that he quit smoking about 31 years ago. His smoking use included cigarettes. He has a 10.00 pack-year smoking history. His smokeless tobacco use includes snuff. He reports that he drinks about 8.0 - 10.0 standard drinks of alcohol per week. He reports that he does not use drugs.  ROS: UROLOGY Frequent Urination?: No Hard to postpone urination?: No Burning/pain with urination?: No Get up at night to urinate?: No Leakage of urine?: No Urine stream starts and stops?: No Trouble starting stream?: No Do you have to strain to urinate?: No Blood in urine?: No Urinary tract infection?: No Sexually transmitted disease?: No Injury to kidneys or bladder?: No Painful intercourse?: No Weak stream?: No Erection problems?: No Penile pain?: No  Gastrointestinal Nausea?: No Vomiting?: No Indigestion/heartburn?: No Diarrhea?: No Constipation?: No  Constitutional Fever: No Night sweats?: No Weight loss?: No Fatigue?: No  Skin Skin rash/lesions?: No Itching?: No  Eyes Blurred vision?: No Double vision?: No  Ears/Nose/Throat Sore throat?: No Sinus problems?: No  Hematologic/Lymphatic Swollen glands?: No Easy bruising?: No  Cardiovascular Leg swelling?: No Chest pain?: No  Respiratory Cough?: No Shortness of breath?: No  Endocrine Excessive thirst?: No  Musculoskeletal Back pain?: No Joint pain?: No  Neurological Headaches?: No Dizziness?: No  Psychologic Depression?: No Anxiety?: No  Physical Exam: BP 120/84 (BP Location: Left Arm, Patient Position: Sitting, Cuff Size: Normal)   Pulse (!) 101   Ht 6' (1.829 m)   Wt 190 lb (86.2 kg)   BMI 25.77 kg/m     Constitutional:  Alert and oriented, No acute distress. HEENT: [place] AT, moist mucus membranes.  Trachea midline, no masses. Cardiovascular: No clubbing, cyanosis, or edema. Respiratory: Normal respiratory effort, no increased work of breathing. GI: Abdomen is soft, nontender, nondistended, no abdominal masses GU: No CVA tenderness.  Prostate 35 g, smooth without nodules Lymph: No cervical or inguinal lymphadenopathy. Skin: No rashes, bruises or suspicious lesions. Neurologic: Grossly intact, no focal deficits, moving all 4 extremities. Psychiatric: Normal mood and affect.   Assessment & Plan:   77 year old male with BPH and stable lower urinary tract symptoms on tamsulosin.  He did not need a refill of his medication at this time.  He has a history of phimosis and states he may be interested  in circumcision in the next several months.  He was concerned about missing work after the procedure and I also discussed the possibility of a dorsal slit.  He will call back if he desires to discuss further and will otherwise continue annual follow-up.   Return in about 1 year (around 11/22/2018) for Recheck, Bladder scan.  [name], [place] [address] [place], [place] [address] [phone]

## 2017-12-20 ENCOUNTER — Other Ambulatory Visit: Payer: Self-pay

## 2017-12-20 MED ORDER — GABAPENTIN 300 MG PO CAPS: 300.0000 mg | ORAL_CAPSULE | Freq: Every day | ORAL | 3 refills | Status: DC

## 2018-02-01 NOTE — Progress Notes (Signed)
NEUROLOGY FOLLOW UP OFFICE NOTE  [name] [id number]  HISTORY OF PRESENT ILLNESS: Todd Diaz is a 78 year old right-handed man with hypertension, hyperlipidemia, type 2 diabetes and history of panic attacks who follows up for TIA and right lumbosacral radiculopathy.  UPDATE:  He is on Plavix, aspirin 325 mg and lovastatin 30 mg daily. LDL from 02/05/17 was 64. Hgb A1c from 11/02/17 was 5.7 Last year, he reported recurrence of lateal and posterior numbness of the right leg.  NCV-EMG of right lower extremity demonstrated mild chronic L5 radiculopathy but no acute changes.  MRI of the lumbar spine from 03/22/2017 was personally reviewed and demonstrated large right extraforaminal disc herniation at L4-5 causing moderate to severe stenosis and L4 and L5 nerve root impingement.  He was referred to pain management.  He stopped gabapentin because it made him feel drunk.  An epidural injection is being considered.    HISTORY: TIAs He presented to [facility] on 10/23/13 with sudden onset of right arm and leg numbness and weakness while watching TV. MRI of brain revealed punctate acute left frontoparietal infarcts.  Symptoms resolved by the next morning. Carotid doppler showed bilateral atherosclerotic plaque, slightly worse in the left ICA compared to prior doppler from 2014. He was already on ASA 50m daily which was unchanged. I don't have any records from this visit.  [facility] right upper and lower extremity weakness with paresthesias. He was out of the window for tPA. CT of head was negative. Symptoms lasted about 2 hours. His ASA was increased to 3223mand discharged from the ED with instructions for neurology follow-up.  2D echo from 08/25/14 showed LV EF 55-60%.   Carotid Doppler from 02/11/2016 showed 1-39% bilateral ICA stenosis with heterogeneous plaque.  RIGHT LUMBOSACRAL RADICULOPATHY: He has history of numbness in the back of his right  leg from the thigh down to the foot. It only occurs when he is sitting in certain chairs or while driving. There is no associated weakness, pain or back pain, although he has a history of back pain. It resolves when he stands up and walks.  Lumbar Xray from January 2017 revealed degenerative disc disease at L4-5 and L5-S1.  Physical therapy helped.  PAST MEDICAL HISTORY: Past Medical History:  Diagnosis Date  . Arthritis    Knees  . [place] medial [place] condyle ([place]  . Chronic prostatitis   . Diabetes mellitus ([facility] [place]   controlled with medication  . Erectile dysfunction   . Hearing loss   . Hyperlipidemia   . Hypertension    controlled with medication  . Hypertrophy of prostate without urinary obstruction and other lower urinary tract symptoms (LUTS)   . Infection of urinary tract    ASSESSMENT:Recurrent MRSA UTI F/B Dr. BlClayborn Bignessnd Dr. CoJacqlyn Larsen. TIA (transient ischemic attack) 02/23/2014   R sided weakness and numbness; duration 1 hour; evaluated [facility].  . Tobacco use disorder     MEDICATIONS: Current Outpatient Medications on File Prior to Visit  Medication Sig Dispense Refill  . Blood Glucose Calibration (ACCU-CHEK COMPACT PLUS CONTROL) SOLN Test blood sugar daily. Dx code: E11.9 1 each 0  . blood glucose meter kit and supplies KIT Dispense based on patient and insurance preference. Check sugar once daily as directed. (FOR ICD-9 250.00, 250.01). 1 each 0  . Blood Glucose Monitoring Suppl (BLOOD GLUCOSE METER KIT AND SUPPLIES) KIT Test blood sugar daily. Dx code: E11.9 1 each 0  .  clopidogrel (PLAVIX) 75 MG tablet TAKE 1 TABLET BY MOUTH ONCE DAILY 90 tablet 1  . diazepam (VALIUM) 5 MG tablet Take 1/2 to 1 po daily as needed with onset of panic attack 20 tablet 0  . gabapentin (NEURONTIN) 300 MG capsule TAKE 1 CAPSULE BY MOUTH AT BEDTIME 30 capsule 0  . gabapentin (NEURONTIN) 300 MG capsule Take 1 capsule (300 mg total) by mouth at bedtime. 30 capsule 3  .  gabapentin (NEURONTIN) 300 MG capsule Take 1 capsule (300 mg total) by mouth at bedtime. 30 capsule 3  . glucose blood test strip Test blood sugar daily. Dx Code: E11.9 100 each 3  . hydrochlorothiazide (HYDRODIURIL) 25 MG tablet TAKE 1 TABLET BY MOUTH EVERY DAY 90 tablet 0  . Lancets MISC Test blood sugar daily. Dx code: E11.9 100 each 3  . lisinopril (PRINIVIL,ZESTRIL) 20 MG tablet TAKE 1 TABLET BY MOUTH ONCE DAILY 90 tablet 3  . lovastatin (MEVACOR) 20 MG tablet take 1 tablet by mouth at bedtime 90 tablet 1  . metFORMIN (GLUCOPHAGE) 500 MG tablet take 1 tablet by mouth twice a day with food 180 tablet 1  . tamsulosin (FLOMAX) 0.4 MG CAPS capsule Take 0.4 mg by mouth daily after breakfast.     No current facility-administered medications on file prior to visit.     ALLERGIES: No Known Allergies  FAMILY HISTORY: Family History  Problem Relation Age of Onset  . Hypertension Mother   . Cancer Mother        lung  . Heart attack Father   . Hypertension Father   . Hyperlipidemia Father   . Aneurysm Father 32       Brain  . Heart disease Father 57       AMI as cause of death  . Diabetes Son   . Healthy Sister    SOCIAL HISTORY: Social History   Socioeconomic History  . Marital status: Widowed    Spouse name: Not on file  . Number of children: 4  . Years of education: 24  . Highest education level: High school graduate  Occupational History  . Occupation: Retired  . Occupation: Engineer, agricultural  Social Needs  . Financial resource strain: Not hard at all  . Food insecurity:    Worry: Never true    Inability: Never true  . Transportation needs:    Medical: No    Non-medical: No  Tobacco Use  . Smoking status: Former Smoker    Packs/day: 0.50    Years: 20.00    Pack years: 10.00    Types: Cigarettes    Last attempt to quit: 01/09/1986    Years since quitting: 32.0  . Smokeless tobacco: Current User    Types: Snuff  . Tobacco comment: chews about 1/2 a pack of tobacco  currently;   Substance and Sexual Activity  . Alcohol use: Yes    Alcohol/week: 8.0 - 10.0 standard drinks    Types: 8 - 10 Cans of beer per week    Comment: 4-5 beers per weekend night.  DUI in 2000.  . Drug use: No  . Sexual activity: Not Currently    Partners: Female  Lifestyle  . Physical activity:    Days per week: 0 days    Minutes per session: 0 min  . Stress: Not at all  Relationships  . Social connections:    Talks on phone: Not on file    Gets together: Not on file    Attends religious  service: Not on file    Active member of club or organization: Not on file    Attends meetings of clubs or organizations: Not on file    Relationship status: Not on file  . Intimate partner violence:    Fear of current or ex partner: Not on file    Emotionally abused: Not on file    Physically abused: Not on file    Forced sexual activity: Not on file  Other Topics Concern  . Not on file  Social History Narrative   Widowed since 02/2009 after 75 years second marriage       Children: 6 children, 2 stepchildren; 8 grandchildren, 2 gg.      Lives: with cat, dog.      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      Tobacco: quit smoking age 55; smoked x 22 years.      Alcohol:  Beer every weekend (5-6 beers per night on weekends).      Seatbelt:  Always uses seat belts, Smoke alarm in the home,      Guns: >5 guns in the home loaded and locked up.      Caffeine ZJQ:BHALPF, Tea, Carbonated beverages; consumes a moderate amount.      Exercise: Inactive.     Advanced Directives:  +LIVING WILL; FULL CODE, No HCPOA.      ADLs: independent with ADLs.  Drives.    REVIEW OF SYSTEMS: Constitutional: No fevers, chills, or sweats, no generalized fatigue, change in appetite Eyes: No visual changes, double vision, eye pain Ear, nose and throat: No hearing loss, ear pain, nasal congestion, sore throat Cardiovascular: No chest pain, palpitations Respiratory:  No shortness of breath at  rest or with exertion, wheezes GastrointestinaI: No nausea, vomiting, diarrhea, abdominal pain, fecal incontinence Genitourinary:  No dysuria, urinary retention or frequency Musculoskeletal:  No neck pain, back pain Integumentary: No rash, pruritus, skin lesions Neurological: as above Psychiatric: No depression, insomnia, anxiety Endocrine: No palpitations, fatigue, diaphoresis, mood swings, change in appetite, change in weight, increased thirst Hematologic/Lymphatic:  No purpura, petechiae. Allergic/Immunologic: no itchy/runny eyes, nasal congestion, recent allergic reactions, rashes  PHYSICAL EXAM: Blood pressure 116/80, pulse 64, height 6' (1.829 m), weight 193 lb (87.5 kg), SpO2 91 %. General: No acute distress.  Patient appears well-groomed. Head:  Normocephalic/atraumatic Eyes:  Fundi examined but not visualized Neck: supple, no paraspinal tenderness, full range of motion Heart:  Regular rate and rhythm Lungs:  Clear to auscultation bilaterally Back: No paraspinal tenderness Neurological Exam: alert and oriented to person, place, and time. Attention span and concentration intact, recent and remote memory intact, fund of knowledge intact.  Speech fluent and not dysarthric, language intact.  CN II-XII intact. Bulk and tone normal, muscle strength 5/5 throughout.  Sensation to light touch, temperature and vibration intact.  Deep tendon reflexes 2+ throughout, toes downgoing.  Finger to nose and heel to shin testing intact.  Gait normal, Romberg negative.  IMPRESSION: 1.  TIA.  Stable. 2.  Right L4-5 radiculopathy 3.  Hypertension 4.  Type 2 diabetes mellitus 5.  Hyperlipidemia  PLAN: 1.  Will repeat carotid doppler.  Further recommendations pending results. 2.  Continue ASA and Plavix for secondary stroke prevention 3.  Continue statin therapy (LDL at goal of less than 70) 4.  Continue glycemic control (Hgb A1c at goal of less than 7) 5.  Continue blood pressure control 6.   Mediterranean diet 7.  Continue follow up with pain management 8.  Follow up with me as needed.  16 minutes spent face to face with patient, over 50% spent discussing management.  Metta Clines, DO  CC: Lavon Paganini, MD

## 2018-02-04 ENCOUNTER — Encounter: Payer: Self-pay | Admitting: Neurology

## 2018-02-04 ENCOUNTER — Ambulatory Visit: Payer: Medicare PPO | Admitting: Neurology

## 2018-02-04 VITALS — BP 116/80 | HR 64 | Ht 72.0 in | Wt 193.0 lb

## 2018-02-04 DIAGNOSIS — E785 Hyperlipidemia, unspecified: Secondary | ICD-10-CM | POA: Diagnosis not present

## 2018-02-04 DIAGNOSIS — M5416 Radiculopathy, lumbar region: Secondary | ICD-10-CM | POA: Diagnosis not present

## 2018-02-04 DIAGNOSIS — G459 Transient cerebral ischemic attack, unspecified: Secondary | ICD-10-CM

## 2018-02-04 DIAGNOSIS — I10 Essential (primary) hypertension: Secondary | ICD-10-CM | POA: Diagnosis not present

## 2018-02-04 DIAGNOSIS — E119 Type 2 diabetes mellitus without complications: Secondary | ICD-10-CM | POA: Diagnosis not present

## 2018-02-04 NOTE — Patient Instructions (Addendum)
1.  We will check another carotid doppler 2.  Continue aspirin and Plavix for secondary stroke prevention 3.  Continue lovastatin for cholesterol. 4.  Continue blood pressure and diabetes medication 5.  Follow Mediterranean diet 6.  Continue back pain management with pain specialist 7.  Follow up as needed.

## 2018-02-04 NOTE — Addendum Note (Signed)
Addended by: Clois Comber on: 02/04/2018 03:05 PM   Modules accepted: Orders

## 2018-03-06 ENCOUNTER — Other Ambulatory Visit: Payer: Self-pay | Admitting: Family Medicine

## 2018-03-06 NOTE — Telephone Encounter (Signed)
[facility] faxed refill request for the following medications:   metFORMIN (GLUCOPHAGE) 500 MG tablet    Please advise.  

## 2018-03-07 MED ORDER — METFORMIN HCL 500 MG PO TABS: 500.0000 mg | ORAL_TABLET | Freq: Two times a day (BID) | ORAL | 3 refills | Status: DC

## 2018-03-30 ENCOUNTER — Other Ambulatory Visit: Payer: Self-pay | Admitting: Family Medicine

## 2018-04-29 ENCOUNTER — Other Ambulatory Visit: Payer: Self-pay | Admitting: Neurology

## 2018-06-02 IMAGING — MR MR LUMBAR SPINE W/O CM
4 of 5 series · 18 of 48 positions shown · non-contrast
Comparison: Lumbar spine radiographs 01/11/2015.

CLINICAL DATA: History of low back pain and RIGHT leg pain.

EXAM:
MRI LUMBAR SPINE WITHOUT CONTRAST
TECHNIQUE: Multiplanar, multisequence MR imaging of the lumbar spine was
performed. No intravenous contrast was administered.

[Series 6: T2 · sagittal · 4.0mm · 0.73mm/px · 6 of 17 slices shown (1 of 2)]
[im 1/17]
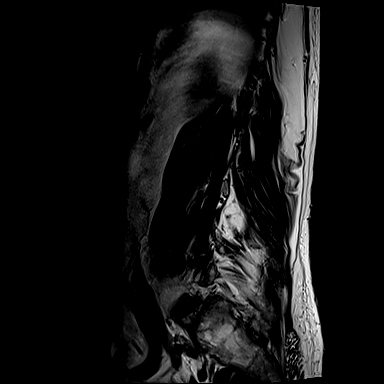
[im 4/17]
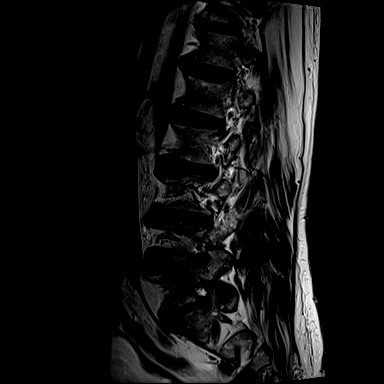
[im 7/17]
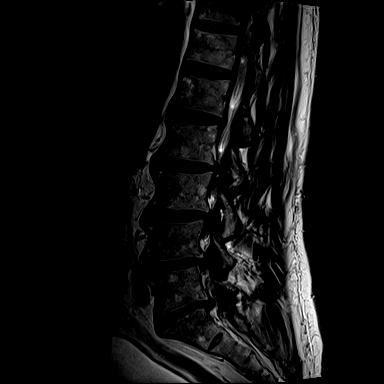
[im 10/17]
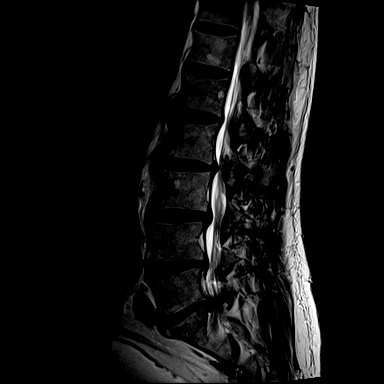
[im 13/17]
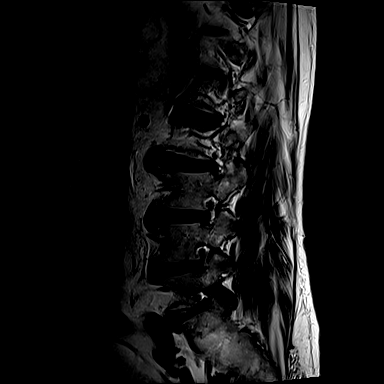
[im 17/17]
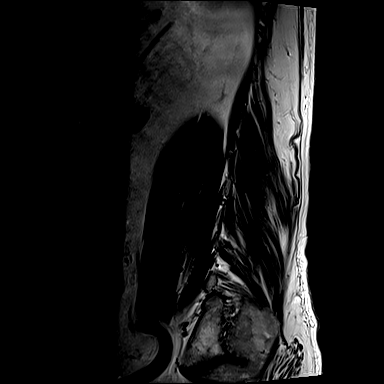

[Series 7: T1 · sagittal · 4.0mm · 0.73mm/px · 3 of 17 slices shown (1 of 2)]
[im 4/17]
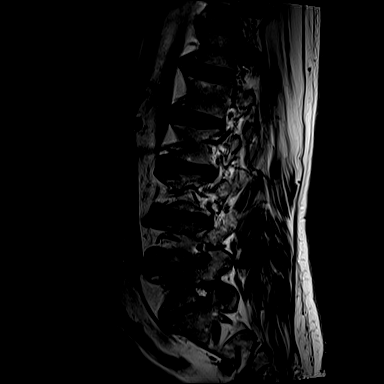
[im 10/17]
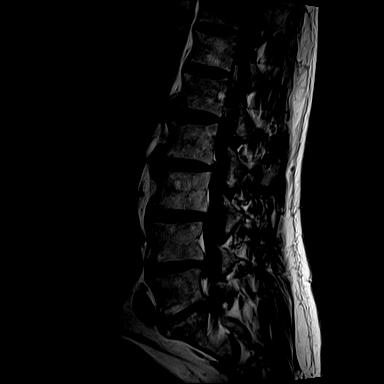
[im 17/17]
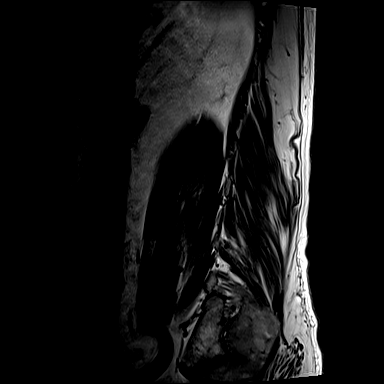

[Series 13: T2 · axial · 4.0mm · 0.28mm/px · z∈[-97,+94]mm · 6 of 41 slices shown (2 of 2)]
[im 1/41]
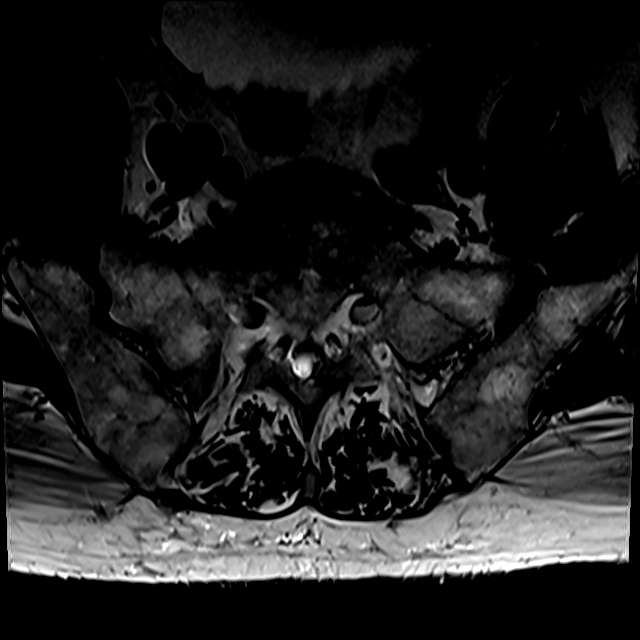
[im 6/41]
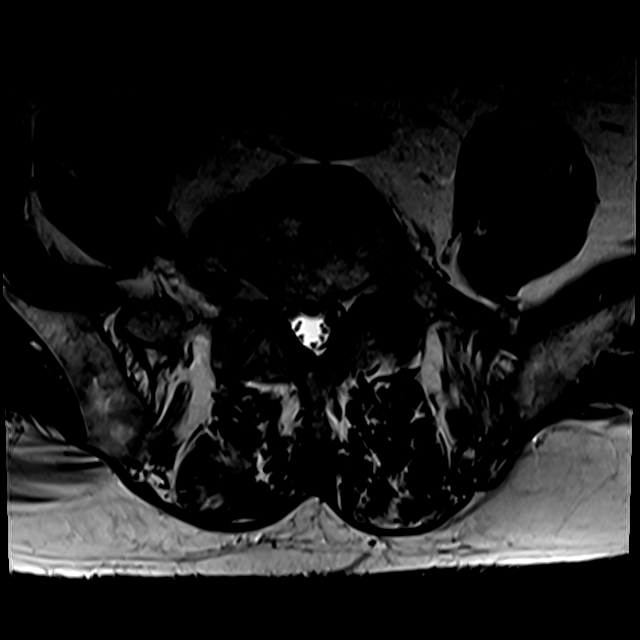
[im 12/41]
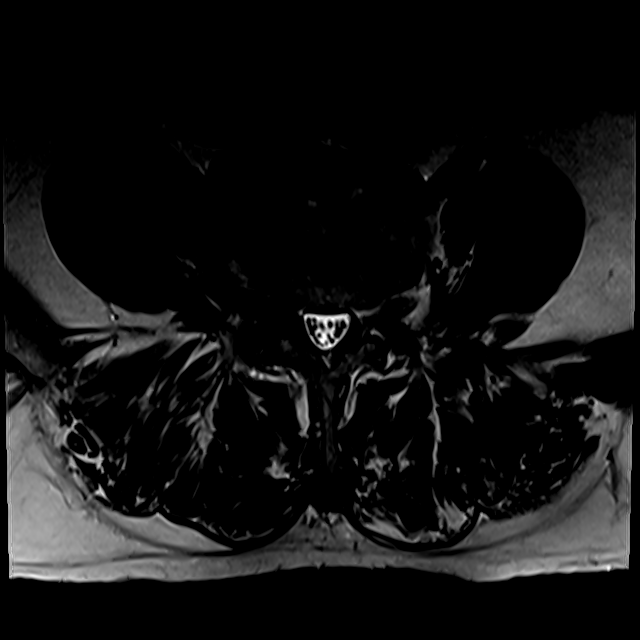
[im 18/41]
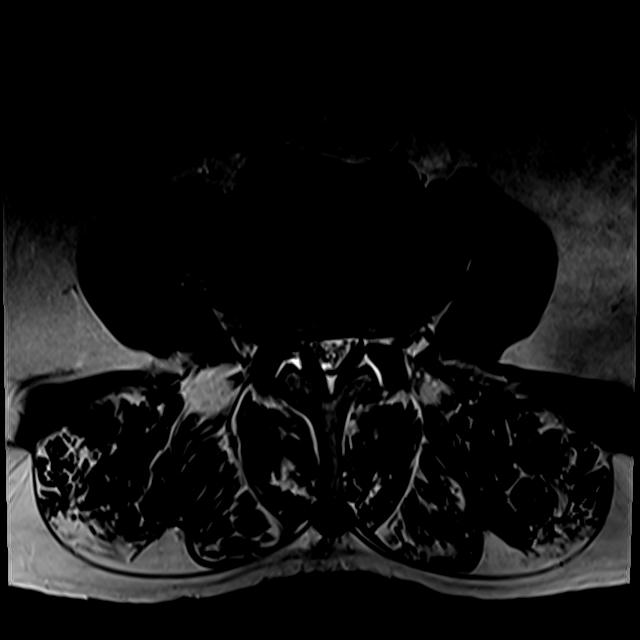
[im 21/41]
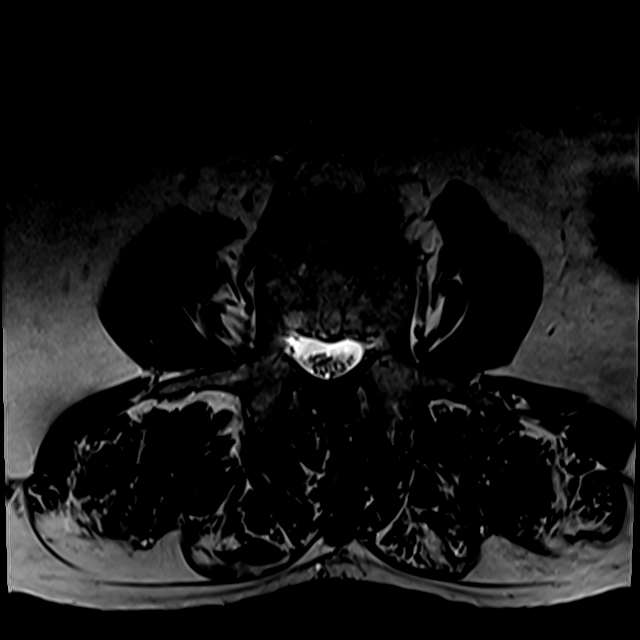
[im 35/41]
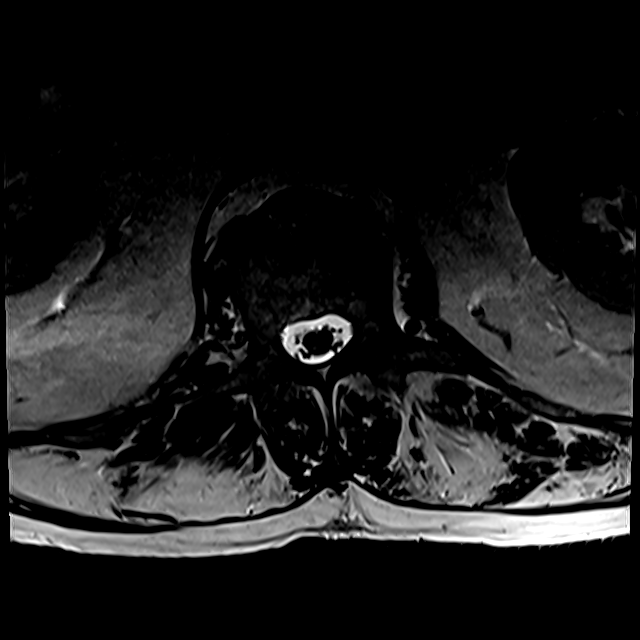

[Series 100: T1 · axial · 4.0mm · 0.28mm/px · z∈[-73,+94]mm · 3 of 41 slices shown (2 of 2)]
[im 6/41]
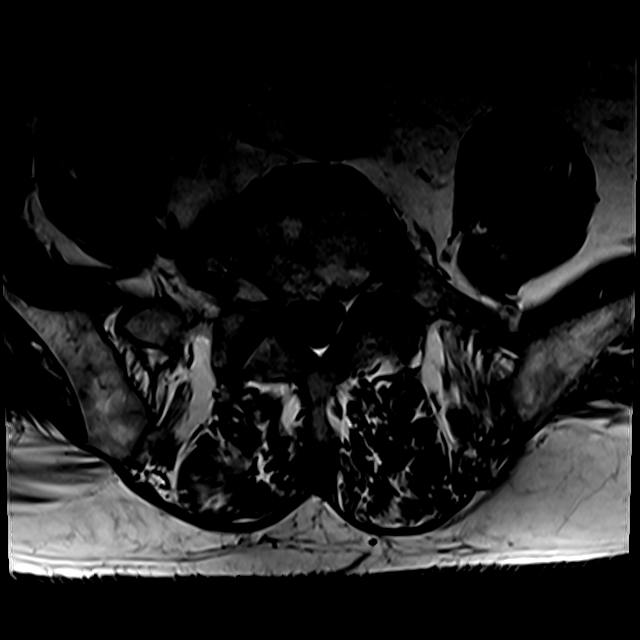
[im 21/41]
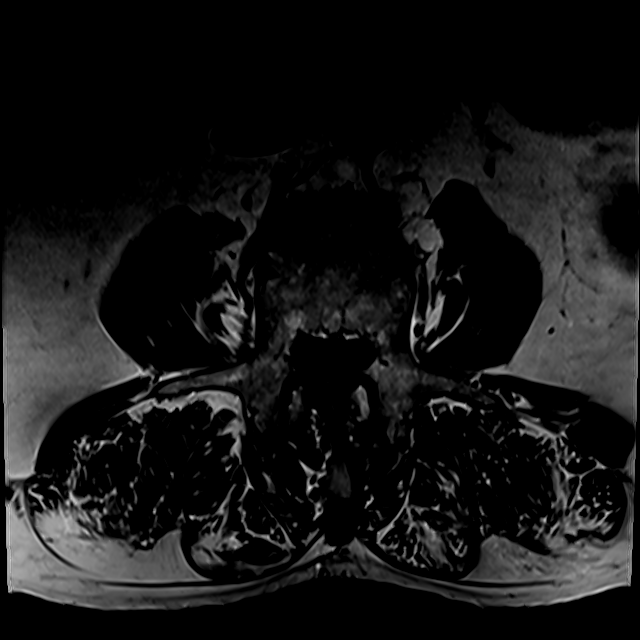
[im 35/41]
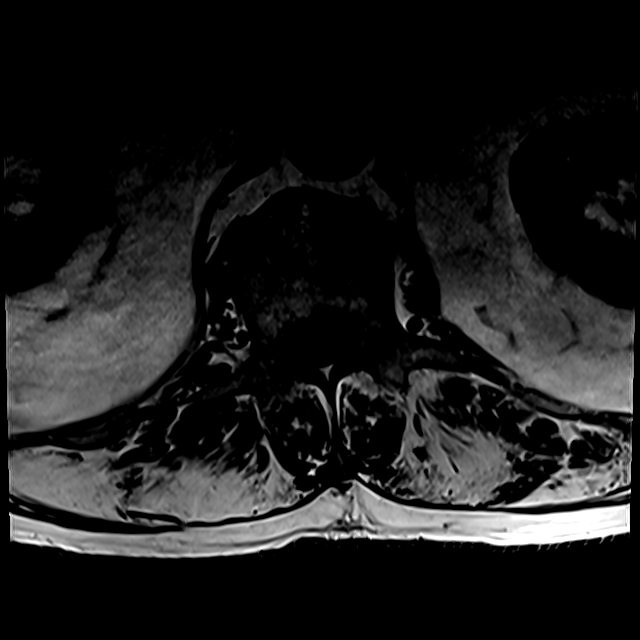

[18 of 48 positions shown; findings below may reference images not displayed]

FINDINGS: Segmentation:  Standard

Alignment: 2 mm retrolisthesis L5-S1 is facet mediated. 1 mm
retrolisthesis L4-5 is facet mediated.

Vertebrae:  No worrisome osseous lesion.

Conus medullaris and cauda equina: Conus extends to the L2-L3 level.
Conus and cauda equina appear otherwise normal.

Paraspinal and other soft tissues: Renal cystic disease on the
RIGHT, incompletely evaluated. Moderately distended bladder.

Disc levels:

L1-L2: Unremarkable disc space. Facet arthropathy. No impingement.

L2-L3: Central protrusion. Posterior element hypertrophy. Mild to
moderate stenosis. BILATERAL L3 nerve root impingement.

L3-L4: Central protrusion. Posterior element hypertrophy. Moderate
to severe stenosis. RIGHT greater than LEFT L4 nerve root
impingement.

L4-L5: Disc space narrowing. Central protrusion. 1 mm
retrolisthesis. Posterior element hypertrophy. Moderate to severe
stenosis. Large extraforaminal disc herniation on the RIGHT is
superimposed. RIGHT greater than LEFT L4 and L5 nerve root
impingement.

L5-S1: 2 mm retrolisthesis. Disc space narrowing. Central and
leftward extrusion. Posterior element hypertrophy. Moderate to
severe stenosis. Disc material extends into both foramina. LEFT
greater than RIGHT L5 and S1 nerve root impingement.
IMPRESSION: Abnormally low conus terminating at L2-L3. If
intervention/myelography is performed, attention to this anatomic
anomaly.

RIGHT-sided symptomatology most likely derives from L4-5 where a
large extraforaminal disc herniation on the RIGHT is observed in
conjunction with a central protrusion, disc space narrowing, and
bony overgrowth. There is moderate to severe stenosis with RIGHT
greater than LEFT L4 and L5 nerve root impingement.

A central and leftward disc extrusion is observed at L5-S1. There is
moderate to severe stenosis at this level as well secondary to 2 mm
retrolisthesis, and posterior element hypertrophy, with LEFT greater
than RIGHT L5 and S1 nerve root impingement.

Central protrusions at L2-3 and L3-4, in conjunction with posterior
element hypertrophy are also potentially symptomatic with regard to
RIGHT-sided radiculopathy; see discussion above.

Renal cystic disease, incompletely evaluated; consider renal
ultrasound.

Moderately distended bladder, could relate to prostatic enlargement.

## 2018-07-17 ENCOUNTER — Ambulatory Visit (INDEPENDENT_AMBULATORY_CARE_PROVIDER_SITE_OTHER): Payer: Medicare PPO

## 2018-07-17 ENCOUNTER — Other Ambulatory Visit: Payer: Self-pay

## 2018-07-17 DIAGNOSIS — Z0000 Encounter for general adult medical examination without abnormal findings: Secondary | ICD-10-CM | POA: Diagnosis not present

## 2018-07-17 NOTE — Progress Notes (Signed)
Subjective:   Todd Diaz is a 78 y.o. male who presents for Medicare Annual/Subsequent preventive examination.    This visit is being conducted through telemedicine due to the COVID-19 pandemic. This patient has given me verbal consent via doximity to conduct this visit, patient states they are participating from their home address. Some vital signs may be absent or patient reported.    Patient identification: identified by name, DOB, and current address  Review of Systems:  N/A  Cardiac Risk Factors include: advanced age (>31mn, >>92women);diabetes mellitus;dyslipidemia;hypertension;male gender     Objective:    Vitals: There were no vitals taken for this visit.  There is no height or weight on file to calculate BMI. Unable to obtain vitals due to visit being conducted via telephonically.   Advanced Directives 07/17/2018 07/11/2017 06/15/2015 03/09/2015 01/11/2015 05/05/2014 07/24/2011  Does Patient Have a Medical Advance Directive? Yes Yes Yes Yes Yes Yes -  Type of AParamedicof ABaileyvilleLiving will H[place] OakLiving will HVincentLiving will HDundeeLiving will - HCortlandLiving will;Advance instruction for mental health treatment;Mental Health Advance Directive -  Does patient want to make changes to medical advance directive? - - - No - Patient declined - No - Patient declined -  Copy of H[place]in Chart? No - copy requested No - copy requested No - copy requested Yes No - copy requested Yes -  Would patient like information on creating a medical advance directive? - - - - - - -  Pre-existing out of facility DNR order (yellow form or pink MOST form) - - - - - - No    Tobacco Social History   Tobacco Use  Smoking Status Former Smoker  . Packs/day: 0.50  . Years: 20.00  . Pack years: 10.00  . Types: Cigarettes  . Quit date: 01/09/1986  . Years since  quitting: 32.5  Smokeless Tobacco Current User  . Types: Snuff  Tobacco Comment   chews about 1/2 a pack of tobacco currently;      Ready to quit: Not Answered Counseling given: Not Answered Comment: chews about 1/2 a pack of tobacco currently;    Clinical Intake:  Pre-visit preparation completed: Yes  Pain : No/denies pain Pain Score: 0-No pain     Nutritional Risks: None Diabetes: Yes  How often do you need to have someone help you when you read instructions, pamphlets, or other written materials from your doctor or pharmacy?: 1 - Never   Diabetes:  Is the patient diabetic?  Yes type 2 If diabetic, was a CBG obtained today?  No  Did the patient bring in their glucometer from home?  No  How often do you monitor your CBG's? Once a day.   Financial Strains and Diabetes Management:  Are you having any financial strains with the device, your supplies or your medication? No .  Does the patient want to be seen by Chronic Care Management for management of their diabetes?  No  Would the patient like to be referred to a Nutritionist or for Diabetic Management?  No   Diabetic Exams:  Diabetic Eye Exam: Completed 07/2014. Overdue for diabetic eye exam. Pt has been advised about the importance in completing this exam. Pt had a an eye exam scheduled for 05/2018 but office cancelled apt due to Covid and is going to CB an r/s.   Diabetic Foot Exam: Completed 06/15/15. Pt has been advised  about the importance in completing this exam. Note made to follow up on this at next in office visit.    Interpreter Needed?: No  Information entered by :: [facility], LPN  Past Medical History:  Diagnosis Date  . Arthritis    Knees  . Avascular necrosis of medial femoral condyle ([place])   . Chronic prostatitis   . Diabetes mellitus ([place])    controlled with medication  . Erectile dysfunction   . Hearing loss   . Hyperlipidemia   . Hypertension    controlled with medication  . Hypertrophy  of prostate without urinary obstruction and other lower urinary tract symptoms (LUTS)   . Infection of urinary tract    ASSESSMENT:Recurrent MRSA UTI F/B Dr. Clayborn Bigness and Dr. Jacqlyn Larsen  . TIA (transient ischemic attack) 02/23/2014   R sided weakness and numbness; duration 1 hour; evaluated [facility].  . Tobacco use disorder    Past Surgical History:  Procedure Laterality Date  . Admission  03/09/2012   [facility]; 24 hour admission; LE numbness/tingling.  CT head, MRI brain, carotid dopplers, 2D-echo.  . AMPUTATION  2009   L fifth finger surgery for near amputation. [place].  Marland Kitchen BLADDER STONE REMOVAL    . EYE SURGERY  08/09/2013   B cataract surgery; [place]  . HERNIA REPAIR  04/09/2013   3 repaired; [place].  . JOINT REPLACEMENT    . PARTIAL KNEE ARTHROPLASTY  07/24/2011   Procedure: UNICOMPARTMENTAL KNEE;  Surgeon: Lorn Junes, MD;  Location: [place];  Service: Orthopedics;  Laterality: Left;  . PROSTATE SURGERY    . RETINAL TEAR REPAIR CRYOTHERAPY  08/09/2013   [name]   Family History  Problem Relation Age of Onset  . Hypertension Mother   . Cancer Mother        lung  . Heart attack Father   . Hypertension Father   . Hyperlipidemia Father   . Aneurysm Father 8       Brain  . Heart disease Father 56       AMI as cause of death  . Diabetes Son   . Healthy Sister    Social History   Socioeconomic History  . Marital status: Widowed    Spouse name: Not on file  . Number of children: 4  . Years of education: 73  . Highest education level: High school graduate  Occupational History  . Occupation: Retired  . Occupation: Engineer, agricultural  Social Needs  . Financial resource strain: Not hard at all  . Food insecurity    Worry: Never true    Inability: Never true  . Transportation needs    Medical: No    Non-medical: No  Tobacco Use  . Smoking status: Former Smoker    Packs/day: 0.50    Years: 20.00    Pack years: 10.00    Types: Cigarettes    Quit date: 01/09/1986    Years since  quitting: 32.5  . Smokeless tobacco: Current User    Types: Snuff  . Tobacco comment: chews about 1/2 a pack of tobacco currently;   Substance and Sexual Activity  . Alcohol use: Yes    Alcohol/week: 8.0 - 10.0 standard drinks    Types: 8 - 10 Cans of beer per week    Comment: 4-5 beers per weekend night.  DUI in 2000.  . Drug use: No  . Sexual activity: Not Currently    Partners: Female  Lifestyle  . Physical activity    Days per week: 0 days  Minutes per session: 0 min  . Stress: Not at all  Relationships  . Social Herbalist on phone: Patient refused    Gets together: Patient refused    Attends religious service: Patient refused    Active member of club or organization: Patient refused    Attends meetings of clubs or organizations: Patient refused    Relationship status: Patient refused  Other Topics Concern  . Not on file  Social History Narrative   Widowed since 02/2009 after 41 years second marriage       Children: 6 children, 2 stepchildren; 8 grandchildren, 2 gg.      Lives: with cat, dog.      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      Tobacco: quit smoking age 59; smoked x 22 years.      Alcohol:  Beer every weekend (5-6 beers per night on weekends).      Seatbelt:  Always uses seat belts, Smoke alarm in the home,      Guns: >5 guns in the home loaded and locked up.      Caffeine DTO:IZTIWP, Tea, Carbonated beverages; consumes a moderate amount.      Exercise: Inactive.     Advanced Directives:  +LIVING WILL; FULL CODE, No HCPOA.      ADLs: independent with ADLs.  Drives.    Outpatient Encounter Medications as of 07/17/2018  Medication Sig  . Blood Glucose Calibration (ACCU-CHEK COMPACT PLUS CONTROL) SOLN Test blood sugar daily. Dx code: E11.9  . blood glucose meter kit and supplies KIT Dispense based on patient and insurance preference. Check sugar once daily as directed. (FOR ICD-9 250.00, 250.01).  . Blood Glucose Monitoring Suppl  (BLOOD GLUCOSE METER KIT AND SUPPLIES) KIT Test blood sugar daily. Dx code: E11.9  . clopidogrel (PLAVIX) 75 MG tablet TAKE 1 TABLET BY MOUTH EVERY DAY  . diazepam (VALIUM) 5 MG tablet Take 1/2 to 1 po daily as needed with onset of panic attack  . gabapentin (NEURONTIN) 300 MG capsule TAKE 1 CAPSULE BY MOUTH AT BEDTIME  . glucose blood test strip Test blood sugar daily. Dx Code: E11.9  . hydrochlorothiazide (HYDRODIURIL) 25 MG tablet TAKE 1 TABLET BY MOUTH EVERY DAY  . Lancets MISC Test blood sugar daily. Dx code: E11.9  . lisinopril (PRINIVIL,ZESTRIL) 20 MG tablet TAKE 1 TABLET BY MOUTH ONCE DAILY  . lovastatin (MEVACOR) 20 MG tablet take 1 tablet by mouth at bedtime  . metFORMIN (GLUCOPHAGE) 500 MG tablet Take 1 tablet (500 mg total) by mouth 2 (two) times daily with a meal.  . tamsulosin (FLOMAX) 0.4 MG CAPS capsule Take 0.4 mg by mouth daily after breakfast.  . gabapentin (NEURONTIN) 300 MG capsule Take 1 capsule (300 mg total) by mouth at bedtime. (Patient not taking: Reported on 02/04/2018)  . gabapentin (NEURONTIN) 300 MG capsule Take 1 capsule (300 mg total) by mouth at bedtime. (Patient not taking: Reported on 02/04/2018)   No facility-administered encounter medications on file as of 07/17/2018.     Activities of Daily Living In your present state of health, do you have any difficulty performing the following activities: 07/17/2018  Hearing? Y  Comment Does not wear hearing aids.  Vision? N  Difficulty concentrating or making decisions? N  Walking or climbing stairs? N  Dressing or bathing? N  Doing errands, shopping? N  Preparing Food and eating ? N  Using the Toilet? N  In the past six months, have you accidently  leaked urine? N  Do you have problems with loss of bowel control? N  Managing your Medications? N  Managing your Finances? N  Housekeeping or managing your Housekeeping? N  Some recent data might be hidden    Patient Care Team: Bacigalupo, Dionne Bucy, MD as PCP -  General (Family Medicine) Pieter Partridge, DO as Consulting Physician (Neurology) [name], MD as Consulting Physician (Urology) Elsie Saas, MD as Consulting Physician (Orthopedic Surgery) Anell Barr, OD as Consulting Physician (Optometry)   Assessment:   This is a routine wellness examination for Todd Diaz.  Exercise Activities and Dietary recommendations Current Exercise Habits: The patient does not participate in regular exercise at present, Exercise limited by: None identified  Goals    . DIET - INCREASE WATER INTAKE     Recommend to drink at least 6-8 8oz glasses of water per day.    Marland Kitchen DIET - REDUCE FRIED FOODS       Fall Risk Fall Risk  07/17/2018 02/04/2018 07/11/2017 02/02/2017 01/18/2017  Falls in the past year? 0 0 No No No  Follow up - Falls evaluation completed - - -   FALL RISK PREVENTION PERTAINING TO THE HOME:  Any stairs in or around the home? No  If so, are there any without handrails? N/A  Home free of loose throw rugs in walkways, pet beds, electrical cords, etc? Yes  Adequate lighting in your home to reduce risk of falls? Yes   ASSISTIVE DEVICES UTILIZED TO PREVENT FALLS:  Life alert? No  Use of a cane, walker or w/c? No  Grab bars in the bathroom? Yes  Shower chair or bench in shower? No  Elevated toilet seat or a handicapped toilet? Yes    TIMED UP AND GO:  Was the test performed? No .    Depression Screen PHQ 2/9 Scores 07/17/2018 07/11/2017 01/18/2017 02/16/2016  PHQ - 2 Score 0 0 0 0    Cognitive Function: Declined today.      6CIT Screen 07/11/2017  What Year? 0 points  What month? 0 points  What time? 0 points  Count back from 20 0 points  Months in reverse 2 points  Repeat phrase 4 points  Total Score 6    Immunization History  Administered Date(s) Administered  . DTaP 03/10/2011  . Influenza Split 09/10/2010  . Influenza, High Dose Seasonal PF 01/18/2017, 11/02/2017  . Influenza,inj,Quad PF,6+ Mos 12/11/2012, 10/13/2013,  01/11/2015, 02/05/2016  . Pneumococcal Conjugate-13 10/13/2013  . Pneumococcal-Unspecified 03/10/2011    Qualifies for Shingles Vaccine? Yes . Due for Shingrix. Education has been provided regarding the importance of this vaccine. Pt has been advised to call insurance company to determine out of pocket expense. Advised may also receive vaccine at local pharmacy or Health Dept. Verbalized acceptance and understanding.  Tdap: Up to date  Flu Vaccine: Up to date  Pneumococcal Vaccine: Completed series  Screening Tests Health Maintenance  Topic Date Due  . OPHTHALMOLOGY EXAM  07/15/2015  . FOOT EXAM  06/14/2016  . HEMOGLOBIN A1C  05/04/2018  . INFLUENZA VACCINE  08/10/2018  . TETANUS/TDAP  12/18/2020  . PNA vac Low Risk Adult  Completed   Cancer Screenings:  Colorectal Screening: No longer required.   Lung Cancer Screening: (Low Dose CT Chest recommended if Age 86-80 years, 30 pack-year currently smoking OR have quit w/in 15years.) does not qualify.   Additional Screening:  Dental Screening: Recommended annual dental exams for proper oral hygiene  Community Resource Referral:  CRR  required this visit?  No        Plan:  I have personally reviewed and addressed the Medicare Annual Wellness questionnaire and have noted the following in the patient's chart:  A. Medical and social history B. Use of alcohol, tobacco or illicit drugs  C. Current medications and supplements D. Functional ability and status E.  Nutritional status F.  Physical activity G. Advance directives H. List of other physicians I.  Hospitalizations, surgeries, and ER visits in previous 12 months J.  [place] such as hearing and vision if needed, cognitive and depression L. Referrals and appointments   In addition, I have reviewed and discussed with patient certain preventive protocols, quality metrics, and best practice recommendations. A written personalized care plan for preventive services  as well as general preventive health recommendations were provided to patient.   [place] Score, [place]  [id number] Nurse Health Advisor  Nurse Notes: Pt needs a diabetic foot exam and Hgb A1c check at next OV. Pt plans to set up an eye exam this year.

## 2018-07-17 NOTE — Patient Instructions (Addendum)
[name] , Thank you for taking time to come for your Medicare Wellness Visit. I appreciate your ongoing commitment to your health goals. Please review the following plan we discussed and let me know if I can assist you in the future.   Screening recommendations/referrals: Colonoscopy: No longer required.  Recommended yearly ophthalmology/optometry visit for glaucoma screening and checkup Recommended yearly dental visit for hygiene and checkup  Vaccinations: Influenza vaccine: Up to date Pneumococcal vaccine: Completed series Tdap vaccine: Up to date, due 12/2020 Shingles vaccine: Pt declines today.     Advanced directives: Please bring a copy of your POA (Power of Attorney) and/or Living Will to your next appointment.   Conditions/risks identified: Recommend to increase water intake to 6-8 8 oz glasses a day.   Next appointment: 10/14/18 @ 3:00 PM with Dr Brita Romp.   Preventive Care 47 Years and Older, Male Preventive care refers to lifestyle choices and visits with your health care provider that can promote health and wellness. What does preventive care include?  A yearly physical exam. This is also called an annual well check.  Dental exams once or twice a year.  Routine eye exams. Ask your health care provider how often you should have your eyes checked.  Personal lifestyle choices, including:  Daily care of your teeth and gums.  Regular physical activity.  Eating a healthy diet.  Avoiding tobacco and drug use.  Limiting alcohol use.  Practicing safe sex.  Taking low doses of aspirin every day.  Taking vitamin and mineral supplements as recommended by your health care provider. What happens during an annual well check? The services and screenings done by your health care provider during your annual well check will depend on your age, overall health, lifestyle risk factors, and family history of disease. Counseling  Your health care provider may ask you  questions about your:  Alcohol use.  Tobacco use.  Drug use.  Emotional well-being.  Home and relationship well-being.  Sexual activity.  Eating habits.  History of falls.  Memory and ability to understand (cognition).  Work and work Statistician. Screening  You may have the following tests or measurements:  Height, weight, and BMI.  Blood pressure.  Lipid and cholesterol levels. These may be checked every 5 years, or more frequently if you are over 10 years old.  Skin check.  Lung cancer screening. You may have this screening every year starting at age 13 if you have a 30-pack-year history of smoking and currently smoke or have quit within the past 15 years.  Fecal occult blood test (FOBT) of the stool. You may have this test every year starting at age 15.  Flexible sigmoidoscopy or colonoscopy. You may have a sigmoidoscopy every 5 years or a colonoscopy every 10 years starting at age 50.  Prostate cancer screening. Recommendations will vary depending on your family history and other risks.  Hepatitis C blood test.  Hepatitis B blood test.  Sexually transmitted disease (STD) testing.  Diabetes screening. This is done by checking your blood sugar (glucose) after you have not eaten for a while (fasting). You may have this done every 1-3 years.  Abdominal aortic aneurysm (AAA) screening. You may need this if you are a current or former smoker.  Osteoporosis. You may be screened starting at age 48 if you are at high risk. Talk with your health care provider about your test results, treatment options, and if necessary, the need for more tests. Vaccines  Your health care provider may  recommend certain vaccines, such as:  Influenza vaccine. This is recommended every year.  Tetanus, diphtheria, and acellular pertussis (Tdap, Td) vaccine. You may need a Td booster every 10 years.  Zoster vaccine. You may need this after age 26.  Pneumococcal 13-valent conjugate  (PCV13) vaccine. One dose is recommended after age 59.  Pneumococcal polysaccharide (PPSV23) vaccine. One dose is recommended after age 61. Talk to your health care provider about which screenings and vaccines you need and how often you need them. This information is not intended to replace advice given to you by your health care provider. Make sure you discuss any questions you have with your health care provider. Document Released: 01/22/2015 Document Revised: 09/15/2015 Document Reviewed: 10/27/2014 Elsevier Interactive Patient Education  2017 [place] Prevention in the Home Falls can cause injuries. They can happen to people of all ages. There are many things you can do to make your home safe and to help prevent falls. What can I do on the outside of my home?  Regularly fix the edges of walkways and driveways and fix any cracks.  Remove anything that might make you trip as you walk through a door, such as a raised step or threshold.  Trim any bushes or trees on the path to your home.  Use bright outdoor lighting.  Clear any walking paths of anything that might make someone trip, such as rocks or tools.  Regularly check to see if handrails are loose or broken. Make sure that both sides of any steps have handrails.  Any raised decks and porches should have guardrails on the edges.  Have any leaves, snow, or ice cleared regularly.  Use sand or salt on walking paths during winter.  Clean up any spills in your garage right away. This includes oil or grease spills. What can I do in the bathroom?  Use night lights.  Install grab bars by the toilet and in the tub and shower. Do not use towel bars as grab bars.  Use non-skid mats or decals in the tub or shower.  If you need to sit down in the shower, use a plastic, non-slip stool.  Keep the floor dry. Clean up any water that spills on the floor as soon as it happens.  Remove soap buildup in the tub or shower  regularly.  Attach bath mats securely with double-sided non-slip rug tape.  Do not have throw rugs and other things on the floor that can make you trip. What can I do in the bedroom?  Use night lights.  Make sure that you have a light by your bed that is easy to reach.  Do not use any sheets or blankets that are too big for your bed. They should not hang down onto the floor.  Have a firm chair that has side arms. You can use this for support while you get dressed.  Do not have throw rugs and other things on the floor that can make you trip. What can I do in the kitchen?  Clean up any spills right away.  Avoid walking on wet floors.  Keep items that you use a lot in easy-to-reach places.  If you need to reach something above you, use a strong step stool that has a grab bar.  Keep electrical cords out of the way.  Do not use floor polish or wax that makes floors slippery. If you must use wax, use non-skid floor wax.  Do not have throw rugs and  other things on the floor that can make you trip. What can I do with my stairs?  Do not leave any items on the stairs.  Make sure that there are handrails on both sides of the stairs and use them. Fix handrails that are broken or loose. Make sure that handrails are as long as the stairways.  Check any carpeting to make sure that it is firmly attached to the stairs. Fix any carpet that is loose or worn.  Avoid having throw rugs at the top or bottom of the stairs. If you do have throw rugs, attach them to the floor with carpet tape.  Make sure that you have a light switch at the top of the stairs and the bottom of the stairs. If you do not have them, ask someone to add them for you. What else can I do to help prevent falls?  Wear shoes that:  Do not have high heels.  Have rubber bottoms.  Are comfortable and fit you well.  Are closed at the toe. Do not wear sandals.  If you use a stepladder:  Make sure that it is fully  opened. Do not climb a closed stepladder.  Make sure that both sides of the stepladder are locked into place.  Ask someone to hold it for you, if possible.  Clearly mark and make sure that you can see:  Any grab bars or handrails.  First and last steps.  Where the edge of each step is.  Use tools that help you move around (mobility aids) if they are needed. These include:  Canes.  Walkers.  Scooters.  Crutches.  Turn on the lights when you go into a dark area. Replace any light bulbs as soon as they burn out.  Set up your furniture so you have a clear path. Avoid moving your furniture around.  If any of your floors are uneven, fix them.  If there are any pets around you, be aware of where they are.  Review your medicines with your doctor. Some medicines can make you feel dizzy. This can increase your chance of falling. Ask your doctor what other things that you can do to help prevent falls. This information is not intended to replace advice given to you by your health care provider. Make sure you discuss any questions you have with your health care provider. Document Released: 10/22/2008 Document Revised: 06/03/2015 Document Reviewed: 01/30/2014 Elsevier Interactive Patient Education  2017 [employer].

## 2018-07-25 ENCOUNTER — Telehealth: Payer: Self-pay | Admitting: Family Medicine

## 2018-07-25 NOTE — Telephone Encounter (Signed)
Pt needs refill on his Viagra  [facility] [address]  CB#  [phone]  [place]

## 2018-07-25 NOTE — Telephone Encounter (Signed)
Medication was never filled by Dr. B and patient states that he will contact that the provider who filled that medication last.

## 2018-08-02 ENCOUNTER — Other Ambulatory Visit: Payer: Self-pay | Admitting: Neurology

## 2018-08-06 NOTE — Telephone Encounter (Signed)
Requested Prescriptions   Pending Prescriptions Disp Refills  . gabapentin (NEURONTIN) 300 MG capsule [Pharmacy Med Name: GABAPENTIN 300MG  CAPSULES] 30 capsule 3    Sig: TAKE 1 CAPSULE(300 MG) BY MOUTH AT BEDTIME   Rx last filled: 12/20/17 #30 3 refills  Pt last seen:02/04/18  Follow up appt scheduled:none

## 2018-09-20 ENCOUNTER — Encounter: Payer: Medicare PPO | Admitting: Family Medicine

## 2018-10-01 ENCOUNTER — Other Ambulatory Visit: Payer: Self-pay | Admitting: Family Medicine

## 2018-10-01 NOTE — Telephone Encounter (Signed)
L.O.V. was 11/02/2017 and upcoming appointment is 11/06/2018.

## 2018-10-12 DIAGNOSIS — H9190 Unspecified hearing loss, unspecified ear: Secondary | ICD-10-CM | POA: Diagnosis not present

## 2018-10-12 DIAGNOSIS — Z6824 Body mass index (BMI) 24.0-24.9, adult: Secondary | ICD-10-CM | POA: Diagnosis not present

## 2018-10-12 DIAGNOSIS — E785 Hyperlipidemia, unspecified: Secondary | ICD-10-CM | POA: Diagnosis not present

## 2018-10-12 DIAGNOSIS — N400 Enlarged prostate without lower urinary tract symptoms: Secondary | ICD-10-CM | POA: Diagnosis not present

## 2018-10-12 DIAGNOSIS — N529 Male erectile dysfunction, unspecified: Secondary | ICD-10-CM | POA: Diagnosis not present

## 2018-10-12 DIAGNOSIS — Z7902 Long term (current) use of antithrombotics/antiplatelets: Secondary | ICD-10-CM | POA: Diagnosis not present

## 2018-10-12 DIAGNOSIS — I10 Essential (primary) hypertension: Secondary | ICD-10-CM | POA: Diagnosis not present

## 2018-10-12 DIAGNOSIS — E119 Type 2 diabetes mellitus without complications: Secondary | ICD-10-CM | POA: Diagnosis not present

## 2018-10-12 DIAGNOSIS — Z7984 Long term (current) use of oral hypoglycemic drugs: Secondary | ICD-10-CM | POA: Diagnosis not present

## 2018-10-14 ENCOUNTER — Encounter: Payer: Medicare PPO | Admitting: Family Medicine

## 2018-10-21 ENCOUNTER — Other Ambulatory Visit: Payer: Self-pay | Admitting: Family Medicine

## 2018-10-21 MED ORDER — HYDROCHLOROTHIAZIDE 25 MG PO TABS: 25.0000 mg | ORAL_TABLET | Freq: Every day | ORAL | 1 refills | Status: DC

## 2018-10-21 NOTE — Telephone Encounter (Signed)
[facility] faxed refill request for the following medications:  hydrochlorothiazide (HYDRODIURIL) 25 MG tablet    Please advise.  

## 2018-10-21 NOTE — Telephone Encounter (Signed)
L.O.V. was on 11/02/2017 and upcoming appointment is 11/06/2018.

## 2018-10-30 ENCOUNTER — Other Ambulatory Visit: Payer: Self-pay | Admitting: Neurology

## 2018-10-30 NOTE — Telephone Encounter (Signed)
Requested Prescriptions   Pending Prescriptions Disp Refills  . clopidogrel (PLAVIX) 75 MG tablet [Pharmacy Med Name: CLOPIDOGREL 75MG  TABLETS] 90 tablet 1    Sig: TAKE 1 TABLET BY MOUTH EVERY DAY   Rx last filled: 04/29/18 #90 1 REFILLS  Pt last seen:02/04/18  Follow up appt scheduled:none per last not f/u PRN

## 2018-11-06 ENCOUNTER — Telehealth: Payer: Self-pay | Admitting: Family Medicine

## 2018-11-06 ENCOUNTER — Ambulatory Visit (INDEPENDENT_AMBULATORY_CARE_PROVIDER_SITE_OTHER): Payer: Medicare PPO | Admitting: Family Medicine

## 2018-11-06 ENCOUNTER — Other Ambulatory Visit: Payer: Self-pay

## 2018-11-06 ENCOUNTER — Encounter: Payer: Self-pay | Admitting: Family Medicine

## 2018-11-06 VITALS — BP 125/76 | HR 81 | Temp 96.9°F | Resp 16 | Ht 72.0 in | Wt 178.0 lb

## 2018-11-06 DIAGNOSIS — L608 Other nail disorders: Secondary | ICD-10-CM | POA: Diagnosis not present

## 2018-11-06 DIAGNOSIS — Z125 Encounter for screening for malignant neoplasm of prostate: Secondary | ICD-10-CM | POA: Diagnosis not present

## 2018-11-06 DIAGNOSIS — Z0000 Encounter for general adult medical examination without abnormal findings: Secondary | ICD-10-CM | POA: Diagnosis not present

## 2018-11-06 DIAGNOSIS — E785 Hyperlipidemia, unspecified: Secondary | ICD-10-CM | POA: Diagnosis not present

## 2018-11-06 DIAGNOSIS — I10 Essential (primary) hypertension: Secondary | ICD-10-CM | POA: Diagnosis not present

## 2018-11-06 DIAGNOSIS — F410 Panic disorder [episodic paroxysmal anxiety] without agoraphobia: Secondary | ICD-10-CM | POA: Diagnosis not present

## 2018-11-06 DIAGNOSIS — E1169 Type 2 diabetes mellitus with other specified complication: Secondary | ICD-10-CM | POA: Diagnosis not present

## 2018-11-06 DIAGNOSIS — Z23 Encounter for immunization: Secondary | ICD-10-CM | POA: Diagnosis not present

## 2018-11-06 DIAGNOSIS — Z720 Tobacco use: Secondary | ICD-10-CM | POA: Diagnosis not present

## 2018-11-06 NOTE — Assessment & Plan Note (Signed)
Noted to have thickened toenails on exam In the setting of diabetes, will refer to podiatry

## 2018-11-06 NOTE — Assessment & Plan Note (Signed)
Previously well controlled Denies any hypoglycemia Discussed diet and exercise Foot exam completed today We will send ROI for last eye exam On statin and ACE inhibitor Up-to-date on vaccinations Repeat A1c Follow-up in 6 months

## 2018-11-06 NOTE — Assessment & Plan Note (Addendum)
3 to 5-minute discussion regarding the harms of continuing to use smokeless tobacco and the benefits of cessation Patient remains precontemplative and not ready to quit at this time We will continue to address

## 2018-11-06 NOTE — Patient Instructions (Signed)
Preventive Care 75 Years and Older, Male Preventive care refers to lifestyle choices and visits with your health care provider that can promote health and wellness. This includes:  A yearly physical exam. This is also called an annual well check.  Regular dental and eye exams.  Immunizations.  Screening for certain conditions.  Healthy lifestyle choices, such as diet and exercise. What can I expect for my preventive care visit? Physical exam Your health care provider will check:  Height and weight. These may be used to calculate body mass index (BMI), which is a measurement that tells if you are at a healthy weight.  Heart rate and blood pressure.  Your skin for abnormal spots. Counseling Your health care provider may ask you questions about:  Alcohol, tobacco, and drug use.  Emotional well-being.  Home and relationship well-being.  Sexual activity.  Eating habits.  History of falls.  Memory and ability to understand (cognition).  Work and work Statistician. What immunizations do I need?  Influenza (flu) vaccine  This is recommended every year. Tetanus, diphtheria, and pertussis (Tdap) vaccine  You may need a Td booster every 10 years. Varicella (chickenpox) vaccine  You may need this vaccine if you have not already been vaccinated. Zoster (shingles) vaccine  You may need this after age 50. Pneumococcal conjugate (PCV13) vaccine  One dose is recommended after age 24. Pneumococcal polysaccharide (PPSV23) vaccine  One dose is recommended after age 33. Measles, mumps, and rubella (MMR) vaccine  You may need at least one dose of MMR if you were born in 1957 or later. You may also need a second dose. Meningococcal conjugate (MenACWY) vaccine  You may need this if you have certain conditions. Hepatitis A vaccine  You may need this if you have certain conditions or if you travel or work in places where you may be exposed to hepatitis A. Hepatitis B vaccine   You may need this if you have certain conditions or if you travel or work in places where you may be exposed to hepatitis B. Haemophilus influenzae type b (Hib) vaccine  You may need this if you have certain conditions. You may receive vaccines as individual doses or as more than one vaccine together in one shot (combination vaccines). Talk with your health care provider about the risks and benefits of combination vaccines. What tests do I need? Blood tests  Lipid and cholesterol levels. These may be checked every 5 years, or more frequently depending on your overall health.  Hepatitis C test.  Hepatitis B test. Screening  Lung cancer screening. You may have this screening every year starting at age 74 if you have a 30-pack-year history of smoking and currently smoke or have quit within the past 15 years.  Colorectal cancer screening. All adults should have this screening starting at age 57 and continuing until age 54. Your health care provider may recommend screening at age 47 if you are at increased risk. You will have tests every 1-10 years, depending on your results and the type of screening test.  Prostate cancer screening. Recommendations will vary depending on your family history and other risks.  Diabetes screening. This is done by checking your blood sugar (glucose) after you have not eaten for a while (fasting). You may have this done every 1-3 years.  Abdominal aortic aneurysm (AAA) screening. You may need this if you are a current or former smoker.  Sexually transmitted disease (STD) testing. Follow these instructions at home: Eating and drinking  Eat  a diet that includes fresh fruits and vegetables, whole grains, lean protein, and low-fat dairy products. Limit your intake of foods with high amounts of sugar, saturated fats, and salt.  Take vitamin and mineral supplements as recommended by your health care provider.  Do not drink alcohol if your health care provider  tells you not to drink.  If you drink alcohol: ? Limit how much you have to 0-2 drinks a day. ? Be aware of how much alcohol is in your drink. In the [place] one drink equals one 12 oz bottle of beer (355 mL), one 5 oz glass of wine (148 mL), or one 1 oz glass of hard liquor (44 mL). Lifestyle  Take daily care of your teeth and gums.  Stay active. Exercise for at least 30 minutes on 5 or more days each week.  Do not use any products that contain nicotine or tobacco, such as cigarettes, e-cigarettes, and chewing tobacco. If you need help quitting, ask your health care provider.  If you are sexually active, practice safe sex. Use a condom or other form of protection to prevent STIs (sexually transmitted infections).  Talk with your health care provider about taking a low-dose aspirin or statin. What's next?  Visit your health care provider once a year for a well check visit.  Ask your health care provider how often you should have your eyes and teeth checked.  Stay up to date on all vaccines. This information is not intended to replace advice given to you by your health care provider. Make sure you discuss any questions you have with your health care provider. Document Released: 01/22/2015 Document Revised: 12/20/2017 Document Reviewed: 12/20/2017 Elsevier Patient Education  2020 Elsevier Inc.  

## 2018-11-06 NOTE — Assessment & Plan Note (Signed)
Well controlled Continue current medications Recheck metabolic panel F/u in 6 months  

## 2018-11-06 NOTE — Telephone Encounter (Signed)
Kim at [facility] called saying they were called this morning to send the patients last diabetic eye exam.  They do not have anything recent .  The last eye exam was 2016.  Con Memos

## 2018-11-06 NOTE — Telephone Encounter (Signed)
Should have been sent to [facility]

## 2018-11-06 NOTE — Progress Notes (Signed)
Patient: Todd Diaz, Male    DOB: October 24, 1940, 78 y.o.   MRN: [id number] Visit Date: 11/06/2018  Today's Provider: Lavon Paganini, MD   Chief Complaint  Patient presents with   Annual Exam   Subjective:     Complete Physical [name] is a 78 y.o. male. He feels well. He reports exercising daily. He reports he is sleeping well. 07/17/2018 MAWE 11/02/2017 CPE  -----------------------------------------------------------   Review of Systems  Constitutional: Negative.   HENT: Negative.   Eyes: Negative.   Respiratory: Negative.   Cardiovascular: Negative.   Gastrointestinal: Negative.   Endocrine: Negative.   Genitourinary: Negative.   Musculoskeletal: Negative.   Skin: Negative.   Allergic/Immunologic: Negative.   Neurological: Negative.   Hematological: Negative.   Psychiatric/Behavioral: Negative.     Social History   Socioeconomic History   Marital status: Widowed    Spouse name: Not on file   Number of children: 4   Years of education: 12   Highest education level: High school graduate  Occupational History   Occupation: Retired   Occupation: Database administrator strain: Not hard at all   Food insecurity    Worry: Never true    Inability: Never true   Transportation needs    Medical: No    Non-medical: No  Tobacco Use   Smoking status: Former Smoker    Packs/day: 0.50    Years: 20.00    Pack years: 10.00    Types: Cigarettes    Quit date: 01/09/1986    Years since quitting: 32.8   Smokeless tobacco: Current User    Types: Snuff   Tobacco comment: chews about 1/2 a pack of tobacco currently;   Substance and Sexual Activity   Alcohol use: Yes    Alcohol/week: 8.0 - 10.0 standard drinks    Types: 8 - 10 Cans of beer per week    Comment: 4-5 beers per weekend night.  DUI in 2000.   Drug use: No   Sexual activity: Not Currently    Partners: Female  Lifestyle   Physical activity   Days per week: 0 days    Minutes per session: 0 min   Stress: Not at all  Relationships   Social connections    Talks on phone: Patient refused    Gets together: Patient refused    Attends religious service: Patient refused    Active member of club or organization: Patient refused    Attends meetings of clubs or organizations: Patient refused    Relationship status: Patient refused   Intimate partner violence    Fear of current or ex partner: Patient refused    Emotionally abused: Patient refused    Physically abused: Patient refused    Forced sexual activity: Patient refused  Other Topics Concern   Not on file  Social History Narrative   Widowed since 02/2009 after 6 years second marriage       Children: 6 children, 2 stepchildren; 8 grandchildren, 2 gg.      Lives: with cat, dog.      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      Tobacco: quit smoking age 3; smoked x 22 years.      Alcohol:  Beer every weekend (5-6 beers per night on weekends).      Seatbelt:  Always uses seat belts, Smoke alarm in the home,      Guns: >5 guns in the home loaded  and locked up.      Caffeine ZOX:WRUEAV, Tea, Carbonated beverages; consumes a moderate amount.      Exercise: Inactive.     Advanced Directives:  +LIVING WILL; FULL CODE, No HCPOA.      ADLs: independent with ADLs.  Drives.    Past Medical History:  Diagnosis Date   Arthritis    Knees   Avascular necrosis of medial femoral condyle (HCC)    Chronic prostatitis    Diabetes mellitus ([place])    controlled with medication   Erectile dysfunction    Hearing loss    Hyperlipidemia    Hypertension    controlled with medication   Hypertrophy of prostate without urinary obstruction and other lower urinary tract symptoms (LUTS)    Infection of urinary tract    ASSESSMENT:Recurrent MRSA UTI F/B Dr. Clayborn Bigness and Dr. Jacqlyn Larsen   TIA (transient ischemic attack) 02/23/2014   R sided weakness and numbness; duration 1  hour; evaluated [facility].   Tobacco use disorder      Patient Active Problem List   Diagnosis Date Noted   Bilateral cataracts 11/21/2017   Chewing tobacco use 11/21/2017   HLD (hyperlipidemia) 11/21/2017   HTN (hypertension) 11/21/2017   Nephrogenic adenoma of bladder 10/07/2015   Starting and stopping of urinary stream during micturition 10/07/2015   Benign localized hyperplasia of prostate with urinary obstruction 08/11/2015   Incomplete emptying of bladder 08/11/2015   Phimosis 08/11/2015   Degenerative disc disease, lumbar 01/12/2015   Type 2 diabetes mellitus without complication, without long-term current use of insulin ([place]) 01/12/2015   History of TIA (transient ischemic attack) 01/12/2015   Right leg numbness 08/18/2014   Bilateral inguinal hernia (BIH) s/p lap reapir w mesh 03/03/2013 02/26/2013   Panic attack 05/20/2012   Essential hypertension, benign 12/26/2011   Impotence 12/26/2011   Pure hypercholesterolemia 12/26/2011   Avascular necrosis of medial femoral condyle [facility])     Past Surgical History:  Procedure Laterality Date   Admission  03/09/2012   [place]; 24 hour admission; LE numbness/tingling.  CT head, MRI brain, carotid dopplers, 2D-echo.   AMPUTATION  2009   L fifth finger surgery for near amputation. [place].   BLADDER STONE REMOVAL     EYE SURGERY  08/09/2013   B cataract surgery; [place]  04/09/2013   3 repaired; [place].   JOINT REPLACEMENT     PARTIAL KNEE ARTHROPLASTY  07/24/2011   Procedure: UNICOMPARTMENTAL KNEE;  Surgeon: Lorn Junes, MD;  Location: [place];  Service: Orthopedics;  Laterality: Left;   PROSTATE SURGERY     RETINAL TEAR REPAIR CRYOTHERAPY  08/09/2013   [name]    His family history includes Aneurysm (age of onset: 35) in his father; Cancer in his mother; Diabetes in his son; Healthy in his sister; Heart attack in his father; Heart disease (age of onset: 49) in his father; Hyperlipidemia in his  father; Hypertension in his father and mother.   Current Outpatient Medications:    aspirin EC 81 MG tablet, Take 81 mg by mouth daily., Disp: , Rfl:    Blood Glucose Calibration (ACCU-CHEK COMPACT PLUS CONTROL) SOLN, Test blood sugar daily. Dx code: E11.9, Disp: 1 each, Rfl: 0   blood glucose meter kit and supplies KIT, Dispense based on patient and insurance preference. Check sugar once daily as directed. (FOR ICD-9 250.00, 250.01)., Disp: 1 each, Rfl: 0   Blood Glucose Monitoring Suppl (BLOOD GLUCOSE METER KIT AND SUPPLIES) KIT, Test blood sugar daily.  Dx code: E11.9, Disp: 1 each, Rfl: 0   clopidogrel (PLAVIX) 75 MG tablet, TAKE 1 TABLET BY MOUTH EVERY DAY, Disp: 90 tablet, Rfl: 1   diazepam (VALIUM) 5 MG tablet, Take 1/2 to 1 po daily as needed with onset of panic attack, Disp: 20 tablet, Rfl: 0   gabapentin (NEURONTIN) 300 MG capsule, TAKE 1 CAPSULE BY MOUTH AT BEDTIME, Disp: 30 capsule, Rfl: 0   gabapentin (NEURONTIN) 300 MG capsule, Take 1 capsule (300 mg total) by mouth at bedtime., Disp: 30 capsule, Rfl: 3   glucose blood test strip, Test blood sugar daily. Dx Code: E11.9, Disp: 100 each, Rfl: 3   hydrochlorothiazide (HYDRODIURIL) 25 MG tablet, Take 1 tablet (25 mg total) by mouth daily., Disp: 90 tablet, Rfl: 1   Lancets MISC, Test blood sugar daily. Dx code: E11.9, Disp: 100 each, Rfl: 3   lisinopril (ZESTRIL) 20 MG tablet, TAKE 1 TABLET BY MOUTH EVERY DAY, Disp: 90 tablet, Rfl: 0   lovastatin (MEVACOR) 20 MG tablet, take 1 tablet by mouth at bedtime, Disp: 90 tablet, Rfl: 1   metFORMIN (GLUCOPHAGE) 500 MG tablet, Take 1 tablet (500 mg total) by mouth 2 (two) times daily with a meal., Disp: 180 tablet, Rfl: 3   tamsulosin (FLOMAX) 0.4 MG CAPS capsule, Take 0.4 mg by mouth daily after breakfast., Disp: , Rfl:    gabapentin (NEURONTIN) 300 MG capsule, TAKE 1 CAPSULE(300 MG) BY MOUTH AT BEDTIME, Disp: 30 capsule, Rfl: 3  Patient Care Team: Virginia Crews, MD as  PCP - General (Family Medicine) Pieter Partridge, DO as Consulting Physician (Neurology) [name], MD as Consulting Physician (Urology) Elsie Saas, MD as Consulting Physician (Orthopedic Surgery) Anell Barr, OD as Consulting Physician (Optometry)     Objective:    Vitals: BP 125/76 (BP Location: Left Arm, Patient Position: Sitting, Cuff Size: Normal)    Pulse 81    Temp (!) 96.9 F (36.1 C) (Temporal)    Resp 16    Ht 6' (1.829 m)    Wt 178 lb (80.7 kg)    BMI 24.14 kg/m   Physical Exam Vitals signs reviewed.  Constitutional:      General: He is not in acute distress.    Appearance: Normal appearance. He is well-developed. He is not diaphoretic.  HENT:     Head: Normocephalic and atraumatic.     Right Ear: Tympanic membrane, ear canal and external ear normal.     Left Ear: Tympanic membrane, ear canal and external ear normal.  Eyes:     General: No scleral icterus.    Conjunctiva/sclera: Conjunctivae normal.     Pupils: Pupils are equal, round, and reactive to light.  Neck:     Musculoskeletal: Neck supple.     Thyroid: No thyromegaly.  Cardiovascular:     Rate and Rhythm: Normal rate and regular rhythm.     Pulses: Normal pulses.     Heart sounds: Normal heart sounds. No murmur.  Pulmonary:     Effort: Pulmonary effort is normal. No respiratory distress.     Breath sounds: Normal breath sounds. No wheezing or rales.  Abdominal:     General: There is no distension.     Palpations: Abdomen is soft.     Tenderness: There is no abdominal tenderness.  Musculoskeletal:        General: No deformity.     Right lower leg: No edema.     Left lower leg: No edema.  Lymphadenopathy:  Cervical: No cervical adenopathy.  Skin:    General: Skin is warm and dry.     Capillary Refill: Capillary refill takes less than 2 seconds.     Findings: No rash.  Neurological:     Mental Status: He is alert and oriented to person, place, and time. Mental status is at baseline.    Psychiatric:        Mood and Affect: Mood normal.        Behavior: Behavior normal.        Thought Content: Thought content normal.     Diabetic Foot Exam - Simple   Simple Foot Form Diabetic Foot exam was performed with the following findings: Yes 11/06/2018  1:17 PM  Visual Inspection No deformities, no ulcerations, no other skin breakdown bilaterally: Yes Sensation Testing Intact to touch and monofilament testing bilaterally: Yes Pulse Check Posterior Tibialis and Dorsalis pulse intact bilaterally: Yes Comments Thickened toenails. Great toe nail is separating from nailbed      Activities of Daily Living In your present state of health, do you have any difficulty performing the following activities: 07/17/2018  Hearing? Y  Comment Does not wear hearing aids.  Vision? N  Difficulty concentrating or making decisions? N  Walking or climbing stairs? N  Dressing or bathing? N  Doing errands, shopping? N  Preparing Food and eating ? N  Using the Toilet? N  In the past six months, have you accidently leaked urine? N  Do you have problems with loss of bowel control? N  Managing your Medications? N  Managing your Finances? N  Housekeeping or managing your Housekeeping? N  Some recent data might be hidden    Fall Risk Assessment Fall Risk  07/17/2018 02/04/2018 07/11/2017 02/02/2017 01/18/2017  Falls in the past year? 0 0 No No No  Follow up - Falls evaluation completed - - -     Depression Screen PHQ 2/9 Scores 07/17/2018 07/11/2017 01/18/2017 02/16/2016  PHQ - 2 Score 0 0 0 0    6CIT Screen 07/11/2017  What Year? 0 points  What month? 0 points  What time? 0 points  Count back from 20 0 points  Months in reverse 2 points  Repeat phrase 4 points  Total Score 6       Assessment & Plan:    Annual Physical Reviewed patient's Family Medical History Reviewed and updated list of patient's medical providers Assessment of cognitive impairment was done Assessed patient's  functional ability Established a written schedule for health screening [place] Completed and Reviewed  Exercise Activities and Dietary recommendations Goals     DIET - INCREASE WATER INTAKE     Recommend to drink at least 6-8 8oz glasses of water per day.     DIET - REDUCE FRIED FOODS       Immunization History  Administered Date(s) Administered   DTaP 03/10/2011   Influenza Split 09/10/2010   Influenza, High Dose Seasonal PF 01/18/2017, 11/02/2017   Influenza,inj,Quad PF,6+ Mos 12/11/2012, 10/13/2013, 01/11/2015, 02/05/2016   Pneumococcal Conjugate-13 10/13/2013   Pneumococcal-Unspecified 03/10/2011    Health Maintenance  Topic Date Due   OPHTHALMOLOGY EXAM  07/15/2015   FOOT EXAM  06/14/2016   HEMOGLOBIN A1C  05/04/2018   INFLUENZA VACCINE  08/10/2018   TETANUS/TDAP  12/18/2020   PNA vac Low Risk Adult  Completed     Discussed health benefits of physical activity, and encouraged him to engage in regular exercise appropriate for his age and condition.    ------------------------------------------------------------------------------------------------------------  Problem List Items Addressed This Visit      Cardiovascular and Mediastinum   Essential hypertension, benign    Well controlled Continue current medications Recheck metabolic panel F/u in 6 months       Relevant Medications   aspirin EC 81 MG tablet   Other Relevant Orders   CBC with Differential/Platelet   Comprehensive metabolic panel     Endocrine   Hyperlipidemia associated with type 2 diabetes mellitus ([place])    Tolerating statin well without myalgias Repeat FLP and CMP Goal LDL less than 70 in the setting of type 2 diabetes and history of TIA      Relevant Medications   aspirin EC 81 MG tablet   Other Relevant Orders   Comprehensive metabolic panel   Lipid Panel With LDL/HDL Ratio   Type 2 diabetes mellitus without complication, without long-term  current use of insulin (HCC)    Previously well controlled Denies any hypoglycemia Discussed diet and exercise Foot exam completed today We will send ROI for last eye exam On statin and ACE inhibitor Up-to-date on vaccinations Repeat A1c Follow-up in 6 months      Relevant Medications   aspirin EC 81 MG tablet     Musculoskeletal and Integument   Toenail deformity    Noted to have thickened toenails on exam In the setting of diabetes, will refer to podiatry      Relevant Orders   Ambulatory referral to Podiatry     Other   Panic attack    Fairly well controlled Has used less than 15 tabs of Valium in the last year Does not need a refill at this time      Relevant Orders   CBC with Differential/Platelet   Chewing tobacco use    3 to 5-minute discussion regarding the harms of continuing to use smokeless tobacco and the benefits of cessation Patient remains precontemplative and not ready to quit at this time We will continue to address       Other Visit Diagnoses    Encounter for annual physical exam    -  Primary   Relevant Orders   CBC with Differential/Platelet   Comprehensive metabolic panel   Lipid Panel With LDL/HDL Ratio   Hemoglobin A1c   PSA Total (Reflex To Free)   Need for influenza vaccination       Relevant Orders   Flu Vaccine QUAD High Dose(Fluad) (Completed)   Prostate cancer screening       Relevant Orders   PSA Total (Reflex To Free)       Return in about 6 months (around 05/07/2019) for chronic disease f/u.   The entirety of the information documented in the History of Present Illness, Review of Systems and Physical Exam were personally obtained by me. Portions of this information were initially documented by Lynford Humphrey, CMA and reviewed by me for thoroughness and accuracy.    Astaria Nanez, Dionne Bucy, MD MPH [facility]

## 2018-11-06 NOTE — Telephone Encounter (Signed)
Oh. Hmmm.... maybe patient is confused about who he has seen?  Can also encourage him to have f/u as well.

## 2018-11-06 NOTE — Assessment & Plan Note (Signed)
Tolerating statin well without myalgias Repeat FLP and CMP Goal LDL less than 70 in the setting of type 2 diabetes and history of TIA

## 2018-11-06 NOTE — Telephone Encounter (Signed)
I did call [facility]. The number on the message is for Todd Diaz, not [facility].

## 2018-11-06 NOTE — Telephone Encounter (Signed)
Patient reports he has not been seen in a while, will call to schedule an appt.

## 2018-11-06 NOTE — Assessment & Plan Note (Signed)
Fairly well controlled Has used less than 15 tabs of Valium in the last year Does not need a refill at this time

## 2018-11-07 ENCOUNTER — Telehealth: Payer: Self-pay

## 2018-11-07 LAB — CBC WITH DIFFERENTIAL/PLATELET
Basophils Absolute: 0 10*3/uL (ref 0.0–0.2)
Basos: 1 %
EOS (ABSOLUTE): 0.1 10*3/uL (ref 0.0–0.4)
Eos: 1 %
Hematocrit: 50.9 % (ref 37.5–51.0)
Hemoglobin: 17 g/dL (ref 13.0–17.7)
Immature Grans (Abs): 0 10*3/uL (ref 0.0–0.1)
Immature Granulocytes: 0 %
Lymphocytes Absolute: 1 10*3/uL (ref 0.7–3.1)
Lymphs: 18 %
MCH: 30.9 pg (ref 26.6–33.0)
MCHC: 33.4 g/dL (ref 31.5–35.7)
MCV: 93 fL (ref 79–97)
Monocytes Absolute: 0.4 10*3/uL (ref 0.1–0.9)
Monocytes: 8 %
Neutrophils Absolute: 4 10*3/uL (ref 1.4–7.0)
Neutrophils: 72 %
Platelets: 202 10*3/uL (ref 150–450)
RBC: 5.5 x10E6/uL (ref 4.14–5.80)
RDW: 12.9 % (ref 11.6–15.4)
WBC: 5.5 10*3/uL (ref 3.4–10.8)

## 2018-11-07 LAB — COMPREHENSIVE METABOLIC PANEL
ALT: 23 IU/L (ref 0–44)
AST: 31 IU/L (ref 0–40)
Albumin/Globulin Ratio: 1.7 (ref 1.2–2.2)
Albumin: 4.5 g/dL (ref 3.7–4.7)
Alkaline Phosphatase: 85 IU/L (ref 39–117)
BUN/Creatinine Ratio: 15 (ref 10–24)
BUN: 13 mg/dL (ref 8–27)
Bilirubin Total: 0.6 mg/dL (ref 0.0–1.2)
CO2: 25 mmol/L (ref 20–29)
Calcium: 9.9 mg/dL (ref 8.6–10.2)
Chloride: 100 mmol/L (ref 96–106)
Creatinine, Ser: 0.88 mg/dL (ref 0.76–1.27)
GFR calc Af Amer: 94 mL/min/{1.73_m2} (ref >59)
GFR calc non Af Amer: 82 mL/min/{1.73_m2} (ref >59)
Globulin, Total: 2.6 g/dL (ref 1.5–4.5)
Glucose: 100 mg/dL — ABNORMAL HIGH (ref 65–99)
Potassium: 4 mmol/L (ref 3.5–5.2)
Sodium: 139 mmol/L (ref 134–144)
Total Protein: 7.1 g/dL (ref 6.0–8.5)

## 2018-11-07 LAB — LIPID PANEL WITH LDL/HDL RATIO
Cholesterol, Total: 186 mg/dL (ref 100–199)
HDL: 41 mg/dL (ref >39)
LDL Chol Calc (NIH): 119 mg/dL — ABNORMAL HIGH (ref 0–99)
LDL/HDL Ratio: 2.9 ratio (ref 0.0–3.6)
Triglycerides: 143 mg/dL (ref 0–149)
VLDL Cholesterol Cal: 26 mg/dL (ref 5–40)

## 2018-11-07 LAB — PSA TOTAL (REFLEX TO FREE): Prostate Specific Ag, Serum: 0.2 ng/mL (ref 0.0–4.0)

## 2018-11-07 LAB — HEMOGLOBIN A1C
Est. average glucose Bld gHb Est-mCnc: 117 mg/dL
Hgb A1c MFr Bld: 5.7 % — ABNORMAL HIGH (ref 4.8–5.6)

## 2018-11-07 NOTE — Telephone Encounter (Signed)
lmtcb

## 2018-11-07 NOTE — Telephone Encounter (Signed)
-----   Message from Virginia Crews, MD sent at 11/07/2018  8:27 AM EDT ----- Normal labs, except cholesterol is not to goal level in the setting of diabetes.  Recommend increasing lovastatin dose to 40 mg daily.  Okay to send prescription if patient agrees.  Would recheck CMP and lipid panel in 3 months.  Hemoglobin A1c, 31-month average of blood sugars, remains very well controlled at 5.7.  If he would like, we could try stopping Metformin and see if diabetes stays well controlled with just diet and exercise

## 2018-11-07 NOTE — Telephone Encounter (Signed)
Patient advised as below. Patient verbalizes understanding and is in agreement with treatment plan. Patient will increase lovastatin and will discontinue metformin for now.

## 2018-11-07 NOTE — Telephone Encounter (Signed)
Received a message from after hours service stating that patient is returning your call and requesting a call back. °

## 2018-11-07 NOTE — Telephone Encounter (Signed)
NA

## 2018-11-14 ENCOUNTER — Ambulatory Visit: Payer: Medicare PPO | Admitting: Podiatry

## 2018-11-20 ENCOUNTER — Telehealth: Payer: Self-pay | Admitting: Urology

## 2018-11-20 DIAGNOSIS — N138 Other obstructive and reflux uropathy: Secondary | ICD-10-CM

## 2018-11-20 MED ORDER — TAMSULOSIN HCL 0.4 MG PO CAPS: 0.4000 mg | ORAL_CAPSULE | Freq: Every day | ORAL | 0 refills | Status: DC

## 2018-11-20 NOTE — Telephone Encounter (Signed)
Pt LMOM for refill for Flomax

## 2018-11-20 NOTE — Telephone Encounter (Signed)
RX sent for 30 day supply until pt's appt.

## 2018-11-25 ENCOUNTER — Ambulatory Visit: Payer: Medicare PPO | Admitting: Urology

## 2018-12-02 ENCOUNTER — Other Ambulatory Visit: Payer: Self-pay

## 2018-12-02 ENCOUNTER — Encounter: Payer: Self-pay | Admitting: Urology

## 2018-12-02 ENCOUNTER — Ambulatory Visit: Payer: Medicare PPO | Admitting: Urology

## 2018-12-02 VITALS — BP 162/78 | HR 74 | Ht 72.0 in | Wt 175.0 lb

## 2018-12-02 DIAGNOSIS — N401 Enlarged prostate with lower urinary tract symptoms: Secondary | ICD-10-CM | POA: Diagnosis not present

## 2018-12-02 DIAGNOSIS — R339 Retention of urine, unspecified: Secondary | ICD-10-CM

## 2018-12-02 DIAGNOSIS — N5201 Erectile dysfunction due to arterial insufficiency: Secondary | ICD-10-CM | POA: Diagnosis not present

## 2018-12-02 DIAGNOSIS — N138 Other obstructive and reflux uropathy: Secondary | ICD-10-CM

## 2018-12-02 LAB — BLADDER SCAN AMB NON-IMAGING: Scan Result: 215

## 2018-12-02 MED ORDER — SILDENAFIL CITRATE 20 MG PO TABS: ORAL_TABLET | ORAL | 0 refills | Status: DC

## 2018-12-02 MED ORDER — TAMSULOSIN HCL 0.4 MG PO CAPS: 0.4000 mg | ORAL_CAPSULE | Freq: Every day | ORAL | 3 refills | Status: DC

## 2018-12-02 NOTE — Progress Notes (Signed)
12/02/2018 2:50 PM   Todd Diaz 03-23-1940 [id number]  Referring provider: Virginia Crews, MD [address] [place],  [place] [address]  Chief Complaint  Patient presents with  . Follow-up    Urologic history: 1.  BPH with lower urinary tract symptoms -Prior TURP several years ago -On tamsulosin with stable LUTS  2.  History of bladder calculus  3.  Erectile dysfunction   HPI: 78 y.o. male followed for BPH and erectile dysfunction.  He has no bothersome lower urinary tract symptoms and remains on tamsulosin.  Denies dysuria, gross hematuria or flank, abdominal, pelvic pain.  He has previously been on Viagra at 50 mg and requested an Rx.  Denies use of oral or sublingual nitrates.  He is able to climb 2 flights of stairs without getting shortness of breath.   PMH: Past Medical History:  Diagnosis Date  . Arthritis    Knees  . Avascular necrosis of medial femoral condyle ([place])   . Chronic prostatitis   . Diabetes mellitus ([place])    controlled with medication  . Erectile dysfunction   . Hearing loss   . Hyperlipidemia   . Hypertension    controlled with medication  . Hypertrophy of prostate without urinary obstruction and other lower urinary tract symptoms (LUTS)   . Infection of urinary tract    ASSESSMENT:Recurrent MRSA UTI F/B Dr. Clayborn Bigness and Dr. Jacqlyn Larsen  . TIA (transient ischemic attack) 02/23/2014   R sided weakness and numbness; duration 1 hour; evaluated [facility].  . Tobacco use disorder     Surgical History: Past Surgical History:  Procedure Laterality Date  . Admission  03/09/2012   [facility]; 24 hour admission; LE numbness/tingling.  CT head, MRI brain, carotid dopplers, 2D-echo.  . AMPUTATION  2009   L fifth finger surgery for near amputation. [place].  Marland Kitchen BLADDER STONE REMOVAL    . EYE SURGERY  08/09/2013   B cataract surgery; [place]  . HERNIA REPAIR  04/09/2013   3 repaired; [place].  . JOINT REPLACEMENT    . PARTIAL KNEE ARTHROPLASTY   07/24/2011   Procedure: UNICOMPARTMENTAL KNEE;  Surgeon: Lorn Junes, MD;  Location: [facility];  Service: Orthopedics;  Laterality: Left;  . PROSTATE SURGERY    . RETINAL TEAR REPAIR CRYOTHERAPY  08/09/2013   Todd Diaz    Home Medications:  Allergies as of 12/02/2018   No Known Allergies     Medication List       Accurate as of December 02, 2018  2:50 PM. If you have any questions, ask your nurse or doctor.        Accu-Chek Compact Plus Control Soln Test blood sugar daily. Dx code: E11.9   aspirin EC 81 MG tablet Take 81 mg by mouth daily.   blood glucose meter kit and supplies Kit Test blood sugar daily. Dx code: E11.9   blood glucose meter kit and supplies Kit Dispense based on patient and insurance preference. Check sugar once daily as directed. (FOR ICD-9 250.00, 250.01).   clopidogrel 75 MG tablet Commonly known as: PLAVIX TAKE 1 TABLET BY MOUTH EVERY DAY   diazepam 5 MG tablet Commonly known as: VALIUM Take 1/2 to 1 po daily as needed with onset of panic attack   gabapentin 300 MG capsule Commonly known as: NEURONTIN TAKE 1 CAPSULE(300 MG) BY MOUTH AT BEDTIME   glucose blood test strip Test blood sugar daily. Dx Code: E11.9   hydrochlorothiazide 25 MG tablet Commonly known as: HYDRODIURIL Take  1 tablet (25 mg total) by mouth daily.   Lancets Misc Test blood sugar daily. Dx code: E11.9   lisinopril 20 MG tablet Commonly known as: ZESTRIL TAKE 1 TABLET BY MOUTH EVERY DAY   lovastatin 20 MG tablet Commonly known as: MEVACOR take 1 tablet by mouth at bedtime   metFORMIN 500 MG tablet Commonly known as: GLUCOPHAGE Take 1 tablet (500 mg total) by mouth 2 (two) times daily with a meal.   tamsulosin 0.4 MG Caps capsule Commonly known as: FLOMAX Take 1 capsule (0.4 mg total) by mouth daily after breakfast.       Allergies: No Known Allergies  Family History: Family History  Problem Relation Age of Onset  . Hypertension Mother   . Cancer Mother         lung  . Heart attack Father   . Hypertension Father   . Hyperlipidemia Father   . Aneurysm Father 33       Brain  . Heart disease Father 7       AMI as cause of death  . Diabetes Son   . Healthy Sister     Social History:  reports that he quit smoking about 32 years ago. His smoking use included cigarettes. He has a 10.00 pack-year smoking history. His smokeless tobacco use includes snuff. He reports current alcohol use of about 8.0 - 10.0 standard drinks of alcohol per week. He reports that he does not use drugs.  ROS:                                        Physical Exam: BP (!) 162/78   Pulse 74   Ht 6' (1.829 m)   Wt 175 lb (79.4 kg)   BMI 23.73 kg/m   Constitutional:  Alert and oriented, No acute distress. HEENT: [place] AT, moist mucus membranes.  Trachea midline, no masses. Cardiovascular: No clubbing, cyanosis, or edema. Respiratory: Normal respiratory effort, no increased work of breathing. GI: Abdomen is soft, nontender, nondistended, no abdominal masses GU: No CVA tenderness Lymph: No cervical or inguinal lymphadenopathy. Skin: No rashes, bruises or suspicious lesions. Neurologic: Grossly intact, no focal deficits, moving all 4 extremities. Psychiatric: Normal mood and affect.   Assessment & Plan:    - Benign localized hyperplasia of prostate with urinary obstruction Bladder scan for PVR was 215 mL.  Prior scan in 2018 was 137 mL.  He has no sensation of incomplete emptying and stable lower urinary tract symptoms.  Recommend a 2-52-monthfollow-up for nurse visit/bladder scan.  Continue annual follow-ups with me.  Tamsulosin was refilled.  - Erectile dysfunction Rx generic sildenafil was sent to his pharmacy.   SAbbie Sons [place] [place] [phone]

## 2018-12-10 ENCOUNTER — Telehealth: Payer: Self-pay

## 2018-12-10 NOTE — Telephone Encounter (Signed)
Called pt informed him that insurance does not cover sildenafil, pt states he will call back if cost is prohibitive at [facility]

## 2018-12-18 ENCOUNTER — Other Ambulatory Visit: Payer: Self-pay | Admitting: Neurology

## 2018-12-19 ENCOUNTER — Telehealth: Payer: Self-pay | Admitting: Family Medicine

## 2018-12-19 NOTE — Telephone Encounter (Signed)
It looks like you D/C pt's Metformin (See recent lab work).  Thanks,   -Mickel Baas

## 2018-12-19 NOTE — Telephone Encounter (Signed)
[facility] faxed refill request for the following medications:  metFORMIN (GLUCOPHAGE) 500 MG tablet   Please advise.  Thanks, [employer]

## 2019-01-20 ENCOUNTER — Other Ambulatory Visit: Payer: Self-pay | Admitting: Family Medicine

## 2019-01-20 MED ORDER — LISINOPRIL 20 MG PO TABS: 20.0000 mg | ORAL_TABLET | Freq: Every day | ORAL | 1 refills | Status: DC

## 2019-01-20 NOTE — Telephone Encounter (Signed)
[facility] faxed refill request for the following medications: ? ?lisinopril (ZESTRIL) 20 MG tablet  ? ?Please advise. ? ?

## 2019-02-05 ENCOUNTER — Ambulatory Visit: Payer: Medicare PPO | Admitting: Urology

## 2019-02-07 ENCOUNTER — Ambulatory Visit: Payer: Self-pay | Admitting: Family Medicine

## 2019-02-12 ENCOUNTER — Ambulatory Visit: Payer: Self-pay | Admitting: Family Medicine

## 2019-02-18 ENCOUNTER — Encounter: Payer: Self-pay | Admitting: Family Medicine

## 2019-02-18 ENCOUNTER — Other Ambulatory Visit: Payer: Self-pay

## 2019-02-18 ENCOUNTER — Ambulatory Visit: Payer: Medicare PPO | Admitting: Family Medicine

## 2019-02-18 ENCOUNTER — Other Ambulatory Visit: Payer: Self-pay | Admitting: Neurology

## 2019-02-18 VITALS — BP 124/76 | HR 80 | Temp 96.6°F | Resp 16 | Wt 185.4 lb

## 2019-02-18 DIAGNOSIS — E1169 Type 2 diabetes mellitus with other specified complication: Secondary | ICD-10-CM | POA: Diagnosis not present

## 2019-02-18 DIAGNOSIS — I10 Essential (primary) hypertension: Secondary | ICD-10-CM

## 2019-02-18 DIAGNOSIS — E785 Hyperlipidemia, unspecified: Secondary | ICD-10-CM | POA: Diagnosis not present

## 2019-02-18 LAB — POCT GLYCOSYLATED HEMOGLOBIN (HGB A1C): Hemoglobin A1C: 5.6 % (ref 4.0–5.6)

## 2019-02-18 MED ORDER — LOVASTATIN 40 MG PO TABS: 40.0000 mg | ORAL_TABLET | Freq: Every day | ORAL | 3 refills | Status: DC

## 2019-02-18 NOTE — Assessment & Plan Note (Signed)
Well controlled Continue current medications Recheck metabolic panel F/u in 6 months  

## 2019-02-18 NOTE — Patient Instructions (Signed)

## 2019-02-18 NOTE — Progress Notes (Signed)
Patient: Todd Diaz Male    DOB: 1940-05-07   79 y.o.   MRN: [id number] Visit Date: 02/18/2019  Today's Provider: Lavon Paganini, MD   Chief Complaint  Patient presents with  . Hyperlipidemia  . Hypertension  . Diabetes   Subjective:     HPI  Diabetes Mellitus Type II, Follow-up:   Lab Results  Component Value Date   HGBA1C 5.6 02/18/2019   HGBA1C 5.7 (H) 11/06/2018   HGBA1C 5.7 (H) 11/02/2017   Last seen for diabetes 3 months ago.  Management since then includes on 11/06/18 we discontinued Meformin to see if patient condition stays well controlled with just diet and exercise.  Current symptoms include none and have been stable. Home blood sugar records: not being checked  Episodes of hypoglycemia? no   Current Insulin Regimen: not on insulin Most Recent Eye Exam: patient reports that it has been over a year,he states that he sees Dr. Ellin Mayhew Weight trend: stable Prior visit with dietician: no Current diet: well balanced Current exercise: no regular exercise  ------------------------------------------------------------------------   Hypertension, follow-up:  BP Readings from Last 3 Encounters:  02/18/19 124/76  12/02/18 (!) 162/78  11/06/18 125/76    He was last seen for hypertension 3 months ago.  BP at that visit was 125/76. Management since that visit includes none.He reports excellent compliance with treatment. He is not having side effects.  He is not exercising. He is adherent to low salt diet.   Outside blood pressures are not being checked. He is experiencing none.  Patient denies chest pain, chest pressure/discomfort, claudication, dyspnea, exertional chest pressure/discomfort, fatigue, irregular heart beat, lower extremity edema, near-syncope, orthopnea, palpitations, paroxysmal nocturnal dyspnea, syncope and tachypnea.   Cardiovascular risk factors include advanced age (older than 54 for men, 34 for women), diabetes mellitus,  hypertension and male gender.  Use of agents associated with hypertension: NSAIDS.   ------------------------------------------------------------------------    Lipid/Cholesterol, Follow-up:   Last seen for this 3 months ago.  Management since that visit includes none.  Last Lipid Panel:    Component Value Date/Time   CHOL 186 11/06/2018 0925   CHOL 177 04/09/2012 0432   TRIG 143 11/06/2018 0925   TRIG 226 (H) 04/09/2012 0432   HDL 41 11/06/2018 0925   HDL 30 (L) 04/09/2012 0432   CHOLHDL 5.2 (H) 02/05/2017 1057   CHOLHDL 6.0 (H) 06/15/2015 1336   VLDL 72 (H) 06/15/2015 1336   VLDL 45 (H) 04/09/2012 0432   LDLCALC 119 (H) 11/06/2018 0925   LDLCALC 102 (H) 04/09/2012 0432    He reports excellent compliance with treatment. He is not having side effects.   Wt Readings from Last 3 Encounters:  02/18/19 185 lb 6.4 oz (84.1 kg)  12/02/18 175 lb (79.4 kg)  11/06/18 178 lb (80.7 kg)    ------------------------------------------------------------------------  No Known Allergies   Current Outpatient Medications:  .  aspirin EC 81 MG tablet, Take 81 mg by mouth daily., Disp: , Rfl:  .  Blood Glucose Calibration (ACCU-CHEK COMPACT PLUS CONTROL) SOLN, Test blood sugar daily. Dx code: E11.9, Disp: 1 each, Rfl: 0 .  blood glucose meter kit and supplies KIT, Dispense based on patient and insurance preference. Check sugar once daily as directed. (FOR ICD-9 250.00, 250.01)., Disp: 1 each, Rfl: 0 .  Blood Glucose Monitoring Suppl (BLOOD GLUCOSE METER KIT AND SUPPLIES) KIT, Test blood sugar daily. Dx code: E11.9, Disp: 1 each, Rfl: 0 .  clopidogrel (PLAVIX) 75 MG  tablet, TAKE 1 TABLET BY MOUTH EVERY DAY, Disp: 90 tablet, Rfl: 1 .  diazepam (VALIUM) 5 MG tablet, Take 1/2 to 1 po daily as needed with onset of panic attack, Disp: 20 tablet, Rfl: 0 .  gabapentin (NEURONTIN) 300 MG capsule, TAKE 1 CAPSULE(300 MG) BY MOUTH AT BEDTIME, Disp: 30 capsule, Rfl: 3 .  glucose blood test strip,  Test blood sugar daily. Dx Code: E11.9, Disp: 100 each, Rfl: 3 .  hydrochlorothiazide (HYDRODIURIL) 25 MG tablet, Take 1 tablet (25 mg total) by mouth daily., Disp: 90 tablet, Rfl: 1 .  Lancets MISC, Test blood sugar daily. Dx code: E11.9, Disp: 100 each, Rfl: 3 .  lisinopril (ZESTRIL) 20 MG tablet, Take 1 tablet (20 mg total) by mouth daily., Disp: 90 tablet, Rfl: 1 .  lovastatin (MEVACOR) 20 MG tablet, take 1 tablet by mouth at bedtime, Disp: 90 tablet, Rfl: 1 .  sildenafil (REVATIO) 20 MG tablet, 2-5 tabs 1 hour prior to intercourse, Disp: 30 tablet, Rfl: 0 .  tamsulosin (FLOMAX) 0.4 MG CAPS capsule, Take 1 capsule (0.4 mg total) by mouth daily after breakfast., Disp: 90 capsule, Rfl: 3 .  metFORMIN (GLUCOPHAGE) 500 MG tablet, Take 1 tablet (500 mg total) by mouth 2 (two) times daily with a meal. (Patient not taking: Reported on 12/02/2018), Disp: 180 tablet, Rfl: 3  Review of Systems  Constitutional: Negative.   Respiratory: Negative.   Cardiovascular: Negative.   Neurological: Negative.   Psychiatric/Behavioral: Negative.     Social History   Tobacco Use  . Smoking status: Former Smoker    Packs/day: 0.50    Years: 20.00    Pack years: 10.00    Types: Cigarettes    Quit date: 01/09/1986    Years since quitting: 33.1  . Smokeless tobacco: Current User    Types: Snuff  . Tobacco comment: chews about 1/2 a pack of tobacco currently;   Substance Use Topics  . Alcohol use: Yes    Alcohol/week: 8.0 - 10.0 standard drinks    Types: 8 - 10 Cans of beer per week    Comment: 4-5 beers per weekend night.  DUI in 2000.      Objective:   BP 124/76   Pulse 80   Temp (!) 96.6 F (35.9 C) (Oral)   Resp 16   Wt 185 lb 6.4 oz (84.1 kg)   SpO2 97%   BMI 25.14 kg/m  Vitals:   02/18/19 1104  BP: 124/76  Pulse: 80  Resp: 16  Temp: (!) 96.6 F (35.9 C)  TempSrc: Oral  SpO2: 97%  Weight: 185 lb 6.4 oz (84.1 kg)  Body mass index is 25.14 kg/m.   Physical Exam Vitals reviewed.   Constitutional:      General: He is not in acute distress.    Appearance: Normal appearance. He is not diaphoretic.  HENT:     Head: Normocephalic and atraumatic.  Eyes:     General: No scleral icterus.    Conjunctiva/sclera: Conjunctivae normal.  Cardiovascular:     Rate and Rhythm: Normal rate and regular rhythm.     Pulses: Normal pulses.     Heart sounds: Normal heart sounds. No murmur.  Pulmonary:     Effort: Pulmonary effort is normal. No respiratory distress.     Breath sounds: Normal breath sounds. No wheezing or rhonchi.  Abdominal:     General: There is no distension.     Palpations: Abdomen is soft.     Tenderness: There is  no abdominal tenderness.  Musculoskeletal:     Cervical back: Neck supple.     Right lower leg: No edema.     Left lower leg: No edema.  Lymphadenopathy:     Cervical: No cervical adenopathy.  Skin:    General: Skin is warm and dry.     Capillary Refill: Capillary refill takes less than 2 seconds.     Findings: No rash.  Neurological:     Mental Status: He is alert and oriented to person, place, and time.     Cranial Nerves: No cranial nerve deficit.  Psychiatric:        Mood and Affect: Mood normal.        Behavior: Behavior normal.      Results for orders placed or performed in visit on 02/18/19  POCT glycosylated hemoglobin (Hb A1C)  Result Value Ref Range   Hemoglobin A1C 5.6 4.0 - 5.6 %   HbA1c POC (<> result, manual entry)     HbA1c, POC (prediabetic range)     HbA1c, POC (controlled diabetic range)         Assessment & Plan    Problem List Items Addressed This Visit      Cardiovascular and Mediastinum   Essential hypertension, benign    Well controlled Continue current medications Recheck metabolic panel F/u in 6 months      Relevant Medications   lovastatin (MEVACOR) 40 MG tablet     Endocrine   Hyperlipidemia associated with type 2 diabetes mellitus ([place])    Tolerating increased statin dose well without  myalgias Repeat FLP and CMP Goal LDL less than 70 in the setting of type 2 diabetes and history of TIA      Relevant Medications   lovastatin (MEVACOR) 40 MG tablet   Other Relevant Orders   Lipid panel (Completed)   Comprehensive metabolic panel (Completed)   Diabetes mellitus ([place]) - Primary    Chronic and well-controlled Associated with/complicated by HTN and HLD Denies any hypoglycemia Now diet controlled, with no medications We will send ROI for last eye exam On statin and ACE inhibitor Up-to-date on vaccinations and foot exam Follow-up in 6 months      Relevant Medications   lovastatin (MEVACOR) 40 MG tablet   Other Relevant Orders   POCT glycosylated hemoglobin (Hb A1C) (Completed)       Return in about 4 months (around 06/18/2019) for chronic disease f/u.   The entirety of the information documented in the History of Present Illness, Review of Systems and Physical Exam were personally obtained by me. Portions of this information were initially documented by Ashley Royalty, CMA and reviewed by me for thoroughness and accuracy.    Sydni Elizarraraz, Dionne Bucy, MD MPH [facility]

## 2019-02-19 ENCOUNTER — Telehealth: Payer: Self-pay

## 2019-02-19 LAB — COMPREHENSIVE METABOLIC PANEL
ALT: 22 IU/L (ref 0–44)
AST: 25 IU/L (ref 0–40)
Albumin/Globulin Ratio: 1.4 (ref 1.2–2.2)
Albumin: 4.1 g/dL (ref 3.7–4.7)
Alkaline Phosphatase: 82 IU/L (ref 39–117)
BUN/Creatinine Ratio: 15 (ref 10–24)
BUN: 11 mg/dL (ref 8–27)
Bilirubin Total: 0.5 mg/dL (ref 0.0–1.2)
CO2: 23 mmol/L (ref 20–29)
Calcium: 9.7 mg/dL (ref 8.6–10.2)
Chloride: 101 mmol/L (ref 96–106)
Creatinine, Ser: 0.74 mg/dL — ABNORMAL LOW (ref 0.76–1.27)
GFR calc Af Amer: 101 mL/min/{1.73_m2} (ref >59)
GFR calc non Af Amer: 87 mL/min/{1.73_m2} (ref >59)
Globulin, Total: 3 g/dL (ref 1.5–4.5)
Glucose: 98 mg/dL (ref 65–99)
Potassium: 4 mmol/L (ref 3.5–5.2)
Sodium: 141 mmol/L (ref 134–144)
Total Protein: 7.1 g/dL (ref 6.0–8.5)

## 2019-02-19 LAB — LIPID PANEL
Chol/HDL Ratio: 3.9 ratio (ref 0.0–5.0)
Cholesterol, Total: 173 mg/dL (ref 100–199)
HDL: 44 mg/dL (ref >39)
LDL Chol Calc (NIH): 109 mg/dL — ABNORMAL HIGH (ref 0–99)
Triglycerides: 112 mg/dL (ref 0–149)
VLDL Cholesterol Cal: 20 mg/dL (ref 5–40)

## 2019-02-19 NOTE — Assessment & Plan Note (Signed)
Tolerating increased statin dose well without myalgias Repeat FLP and CMP Goal LDL less than 70 in the setting of type 2 diabetes and history of TIA

## 2019-02-19 NOTE — Telephone Encounter (Signed)
Pt advised.   Thanks,   -Lydell Moga  

## 2019-02-19 NOTE — Assessment & Plan Note (Signed)
Chronic and well-controlled Associated with/complicated by HTN and HLD Denies any hypoglycemia Now diet controlled, with no medications We will send ROI for last eye exam On statin and ACE inhibitor Up-to-date on vaccinations and foot exam Follow-up in 6 months

## 2019-02-19 NOTE — Telephone Encounter (Signed)
-----   Message from Virginia Crews, MD sent at 02/19/2019 12:20 PM EST ----- Normal labs. Cholesterol has improved, but still not to goal. Make sure he is taking Lovastatin 40mg  daily.

## 2019-03-05 ENCOUNTER — Ambulatory Visit: Payer: Medicare PPO | Admitting: Urology

## 2019-04-02 ENCOUNTER — Other Ambulatory Visit: Payer: Self-pay | Admitting: *Deleted

## 2019-04-02 MED ORDER — SILDENAFIL CITRATE 20 MG PO TABS: ORAL_TABLET | ORAL | 0 refills | Status: DC

## 2019-04-21 ENCOUNTER — Other Ambulatory Visit: Payer: Self-pay | Admitting: Family Medicine

## 2019-04-21 MED ORDER — HYDROCHLOROTHIAZIDE 25 MG PO TABS: 25.0000 mg | ORAL_TABLET | Freq: Every day | ORAL | 1 refills | Status: DC

## 2019-04-21 NOTE — Telephone Encounter (Signed)
[facility] faxed refill request for the following medications:  hydrochlorothiazide (HYDRODIURIL) 25 MG tablet  Last Rx: 10/21/2018 LOV: 02/18/2019 Please advise. Thanks TNP

## 2019-05-02 NOTE — Progress Notes (Deleted)
Established patient visit   Patient: Todd Diaz   DOB: 1940-06-26   79 y.o. Male  MRN: [id number] Visit Date: 05/02/2019  Today's healthcare provider: Lavon Paganini, MD   No chief complaint on file.  Subjective    HPI  Diabetes Mellitus Type II, follow-up  Lab Results  Component Value Date   HGBA1C 5.6 02/18/2019   HGBA1C 5.7 (H) 11/06/2018   HGBA1C 5.7 (H) 11/02/2017   Last seen for diabetes {1-12:18279} {days/wks/mos/yrs:310907} ago.  Management since then includes {management:2::"continuing the same treatment."} He reports {excellent/good/fair/poor:19665} compliance with treatment. He {is/is not:21021397} having side effects. {document side effects if present:1}  Home blood sugar records: {diabetes glucometry results:16657}  Episodes of hypoglycemia? {Yes/No:20286} {enter details if yes:1}   Current insulin regiment: None  Most Recent Eye Exam: Pt needs a referral for an eye exam  ----------------------------------------------------------------------------------------- Hypertension, follow-up  BP Readings from Last 3 Encounters:  02/18/19 124/76  12/02/18 (!) 162/78  11/06/18 125/76   He was last seen for hypertension {NUMBERS 1-12:18279} {days/wks/mos/yrs:310907} ago.  BP at that visit was ***. Management since that visit includes ***. He reports {excellent/good/fair/poor:19665} compliance with treatment. He {is/is not:9024} having side effects. {document side effects if present:1} He {is/is not:9024} exercising. He {is/is not:9024} adherent to low salt diet.   Outside blood pressures are {enter patient reported home BP, or 'not being checked':1}.  He {does/does not:200015} smoke.  Use of agents associated with hypertension: {bp agents assoc with hypertension:511::"none"}.   ----------------------------------------------------------------------------------------- Lipid/Cholesterol, follow-up  Last Lipid Panel: Lab Results  Component Value  Date   CHOL 173 02/18/2019   LDLCALC 109 (H) 02/18/2019   HDL 44 02/18/2019   TRIG 112 02/18/2019   ALT 22 02/18/2019   AST 25 02/18/2019   PLT 202 11/06/2018    He was last seen for this {1-12:18279} {days/wks/mos/yrs:310907} ago.  Management since that visit includes ***.  He reports {excellent/good/fair/poor:19665} compliance with treatment. He {is/is not:9024} having side effects. {document side effects if present:1}  Symptoms: {Yes/No:20286} appetite changes {Yes/No:20286} foot ulcerations {Yes/No:20286} chest pain {Yes/No:20286} chest pressure/discomfort {Yes/No:20286} dyspnea {Yes/No:20286} fatigue {Yes/No:20286} lower extremity edema {Yes/No:20286} nausea {Yes/No:20286} numbness or tingling of extremity {Yes/No:20286} orthopnea {Yes/No:20286} palpitations {Yes/No:20286} paroxysmal nocturnal dyspnea {Yes/No:20286} polydipsia {Yes/No:20286} polyuria {Yes/No:20286} speech difficulty {Yes/No:20286} syncope {Yes/No:20286} visual disturbances   He is following a {diet:21022986} diet. Current exercise: {exercise types:16438}  Wt Readings from Last 3 Encounters:  02/18/19 185 lb 6.4 oz (84.1 kg)  12/02/18 175 lb (79.4 kg)  11/06/18 178 lb (80.7 kg)   Last metabolic panel Lab Results  Component Value Date   GLUCOSE 98 02/18/2019   NA 141 02/18/2019   K 4.0 02/18/2019   BUN 11 02/18/2019   CREATININE 0.74 (L) 02/18/2019   GFRNONAA 87 02/18/2019   GFRAA 101 02/18/2019   CALCIUM 9.7 02/18/2019   AST 25 02/18/2019   ALT 22 02/18/2019   The ASCVD Risk score (Goff DC Jr., et al., 2013) failed to calculate for the following reasons:   The 2013 ASCVD risk score is only valid for ages 28 to 6  -----------------------------------------------------------------------------------------    {Show patient history (optional):23778::" "}   Medications: Outpatient Medications Prior to Visit  Medication Sig  . aspirin EC 81 MG tablet Take 81 mg by mouth daily.  .  Blood Glucose Calibration (ACCU-CHEK COMPACT PLUS CONTROL) SOLN Test blood sugar daily. Dx code: E11.9  . blood glucose meter kit and supplies KIT Dispense based on patient and insurance preference. Check sugar once daily  as directed. (FOR ICD-9 250.00, 250.01).  . Blood Glucose Monitoring Suppl (BLOOD GLUCOSE METER KIT AND SUPPLIES) KIT Test blood sugar daily. Dx code: E11.9  . clopidogrel (PLAVIX) 75 MG tablet TAKE 1 TABLET BY MOUTH EVERY DAY  . diazepam (VALIUM) 5 MG tablet Take 1/2 to 1 po daily as needed with onset of panic attack  . gabapentin (NEURONTIN) 300 MG capsule TAKE 1 CAPSULE(300 MG) BY MOUTH AT BEDTIME  . glucose blood test strip Test blood sugar daily. Dx Code: E11.9  . hydrochlorothiazide (HYDRODIURIL) 25 MG tablet Take 1 tablet (25 mg total) by mouth daily.  . Lancets MISC Test blood sugar daily. Dx code: E11.9  . lisinopril (ZESTRIL) 20 MG tablet Take 1 tablet (20 mg total) by mouth daily.  Marland Kitchen lovastatin (MEVACOR) 40 MG tablet Take 1 tablet (40 mg total) by mouth at bedtime.  . sildenafil (REVATIO) 20 MG tablet 2-5 tabs 1 hour prior to intercourse  . tamsulosin (FLOMAX) 0.4 MG CAPS capsule Take 1 capsule (0.4 mg total) by mouth daily after breakfast.   No facility-administered medications prior to visit.    Review of Systems  {Show previous labs (optional):23779::" "}   Objective    There were no vitals taken for this visit. {Show previous vital signs (optional):23777::" "}  Physical Exam  ***  No results found for any visits on 05/07/19.  Assessment & Plan    ***  No follow-ups on file.      {provider attestation***:1}   Lavon Paganini, MD  [facility] [phone] (phone) [phone] (fax)  [place]

## 2019-05-07 ENCOUNTER — Ambulatory Visit: Payer: Self-pay | Admitting: Family Medicine

## 2019-05-25 ENCOUNTER — Other Ambulatory Visit: Payer: Self-pay | Admitting: Neurology

## 2019-06-18 ENCOUNTER — Other Ambulatory Visit: Payer: Self-pay | Admitting: Neurology

## 2019-06-18 ENCOUNTER — Ambulatory Visit: Payer: Medicare PPO | Admitting: Family Medicine

## 2019-06-18 ENCOUNTER — Other Ambulatory Visit: Payer: Self-pay

## 2019-06-18 ENCOUNTER — Encounter: Payer: Self-pay | Admitting: Family Medicine

## 2019-06-18 VITALS — BP 96/58 | HR 69 | Temp 97.5°F | Resp 16 | Wt 180.0 lb

## 2019-06-18 DIAGNOSIS — Z7901 Long term (current) use of anticoagulants: Secondary | ICD-10-CM | POA: Diagnosis not present

## 2019-06-18 DIAGNOSIS — E1169 Type 2 diabetes mellitus with other specified complication: Secondary | ICD-10-CM | POA: Diagnosis not present

## 2019-06-18 DIAGNOSIS — I10 Essential (primary) hypertension: Secondary | ICD-10-CM

## 2019-06-18 DIAGNOSIS — I959 Hypotension, unspecified: Secondary | ICD-10-CM | POA: Diagnosis not present

## 2019-06-18 DIAGNOSIS — D692 Other nonthrombocytopenic purpura: Secondary | ICD-10-CM | POA: Insufficient documentation

## 2019-06-18 LAB — POCT GLYCOSYLATED HEMOGLOBIN (HGB A1C)
Est. average glucose Bld gHb Est-mCnc: 120
Hemoglobin A1C: 5.8 % — AB (ref 4.0–5.6)

## 2019-06-18 MED ORDER — HYDROCHLOROTHIAZIDE 12.5 MG PO TABS: 12.5000 mg | ORAL_TABLET | Freq: Every day | ORAL | 3 refills | Status: DC

## 2019-06-18 NOTE — Assessment & Plan Note (Signed)
Continue to monitor

## 2019-06-18 NOTE — Progress Notes (Signed)
Established patient visit   Patient: Todd Diaz   DOB: July 21, 1940   79 y.o. Male  MRN: [id number] Visit Date: 06/18/2019  I,Sulibeya S Dimas,acting as a scribe for Lavon Paganini, MD.,have documented all relevant documentation on the behalf of Lavon Paganini, MD,as directed by  Lavon Paganini, MD while in the presence of Lavon Paganini, MD. Today's healthcare provider: Lavon Paganini, MD   Chief Complaint  Patient presents with  . Diabetes  . Hypertension   Subjective    HPI Diabetes Mellitus Type II, Follow-up  Lab Results  Component Value Date   HGBA1C 5.8 (A) 06/18/2019   HGBA1C 5.6 02/18/2019   HGBA1C 5.7 (H) 11/06/2018   Wt Readings from Last 3 Encounters:  06/18/19 180 lb (81.6 kg)  02/18/19 185 lb 6.4 oz (84.1 kg)  12/02/18 175 lb (79.4 kg)   Last seen for diabetes 4 months ago.  Management since then includes no changes. He reports excellent compliance with treatment. No oral medication.  Symptoms: No fatigue No foot ulcerations  No appetite changes No nausea  No paresthesia of the feet  No polydipsia  No polyuria No visual disturbances   No vomiting     Home blood sugar records: stable  Episodes of hypoglycemia? Yes shaky   Current insulin regiment: none Most Recent Eye Exam: not upt Current exercise: none Current diet habits: in general, a "healthy" diet    Pertinent Labs: Lab Results  Component Value Date   CHOL 173 02/18/2019   HDL 44 02/18/2019   LDLCALC 109 (H) 02/18/2019   TRIG 112 02/18/2019   CHOLHDL 3.9 02/18/2019   Lab Results  Component Value Date   NA 141 02/18/2019   K 4.0 02/18/2019   CREATININE 0.74 (L) 02/18/2019   GFRNONAA 87 02/18/2019   GFRAA 101 02/18/2019   GLUCOSE 98 02/18/2019     --------------------------------------------------------------------------------------------------- Hypertension, follow-up  BP Readings from Last 3 Encounters:  06/18/19 (!) 96/58  02/18/19 124/76  12/02/18  (!) 162/78   Wt Readings from Last 3 Encounters:  06/18/19 180 lb (81.6 kg)  02/18/19 185 lb 6.4 oz (84.1 kg)  12/02/18 175 lb (79.4 kg)     He was last seen for hypertension 4 months ago.  BP at that visit was 124/76. Management since that visit includes no changes.  He reports excellent compliance with treatment. He is not having side effects.  He is following a Regular diet. He is exercising. He does smoke.  Use of agents associated with hypertension: none.   Outside blood pressures are not being checked. Symptoms: No chest pain No chest pressure  No palpitations No syncope  No dyspnea No orthopnea  No paroxysmal nocturnal dyspnea No lower extremity edema   Pertinent labs: Lab Results  Component Value Date   CHOL 173 02/18/2019   HDL 44 02/18/2019   LDLCALC 109 (H) 02/18/2019   TRIG 112 02/18/2019   CHOLHDL 3.9 02/18/2019   Lab Results  Component Value Date   NA 141 02/18/2019   K 4.0 02/18/2019   CREATININE 0.74 (L) 02/18/2019   GFRNONAA 87 02/18/2019   GFRAA 101 02/18/2019   GLUCOSE 98 02/18/2019     The ASCVD Risk score (Goff DC Jr., et al., 2013) failed to calculate for the following reasons:   The 2013 ASCVD risk score is only valid for ages 18 to 66   ---------------------------------------------------------------------------------------------------   Patient Active Problem List   Diagnosis Date Noted  . Senile purpura ([place]) 06/18/2019  .  Hypotension 06/18/2019  . Erectile dysfunction due to arterial insufficiency 12/02/2018  . Toenail deformity 11/06/2018  . Bilateral cataracts 11/21/2017  . Chewing tobacco use 11/21/2017  . Nephrogenic adenoma of bladder 10/07/2015  . Starting and stopping of urinary stream during micturition 10/07/2015  . Benign localized hyperplasia of prostate with urinary obstruction 08/11/2015  . Incomplete bladder emptying 08/11/2015  . Phimosis 08/11/2015  . Degenerative disc disease, lumbar 01/12/2015  . Diabetes  mellitus ([place]) 01/12/2015  . History of TIA (transient ischemic attack) 01/12/2015  . Right leg numbness 08/18/2014  . Bilateral inguinal hernia (BIH) s/p lap reapir w mesh 03/03/2013 02/26/2013  . Panic attack 05/20/2012  . Essential hypertension, benign 12/26/2011  . Impotence 12/26/2011  . Hyperlipidemia associated with type 2 diabetes mellitus ([place]) 12/26/2011  . Avascular necrosis of medial femoral condyle ([facility])    Social History   Tobacco Use  . Smoking status: Former Smoker    Packs/day: 0.50    Years: 20.00    Pack years: 10.00    Types: Cigarettes    Quit date: 01/09/1986    Years since quitting: 33.4  . Smokeless tobacco: Current User    Types: Snuff  . Tobacco comment: chews about 1/2 a pack of tobacco currently;   Substance Use Topics  . Alcohol use: Yes    Alcohol/week: 8.0 - 10.0 standard drinks    Types: 8 - 10 Cans of beer per week    Comment: 4-5 beers per weekend night.  DUI in 2000.  . Drug use: No       Medications: Outpatient Medications Prior to Visit  Medication Sig  . aspirin EC 81 MG tablet Take 81 mg by mouth daily.  . Blood Glucose Calibration (ACCU-CHEK COMPACT PLUS CONTROL) SOLN Test blood sugar daily. Dx code: E11.9  . blood glucose meter kit and supplies KIT Dispense based on patient and insurance preference. Check sugar once daily as directed. (FOR ICD-9 250.00, 250.01).  . Blood Glucose Monitoring Suppl (BLOOD GLUCOSE METER KIT AND SUPPLIES) KIT Test blood sugar daily. Dx code: E11.9  . clopidogrel (PLAVIX) 75 MG tablet TAKE 1 TABLET BY MOUTH EVERY DAY  . diazepam (VALIUM) 5 MG tablet Take 1/2 to 1 po daily as needed with onset of panic attack  . glucose blood test strip Test blood sugar daily. Dx Code: E11.9  . Lancets MISC Test blood sugar daily. Dx code: E11.9  . lisinopril (ZESTRIL) 20 MG tablet Take 1 tablet (20 mg total) by mouth daily.  Marland Kitchen lovastatin (MEVACOR) 40 MG tablet Take 1 tablet (40 mg total) by mouth at bedtime.  . sildenafil  (REVATIO) 20 MG tablet 2-5 tabs 1 hour prior to intercourse  . tamsulosin (FLOMAX) 0.4 MG CAPS capsule Take 1 capsule (0.4 mg total) by mouth daily after breakfast.  . [DISCONTINUED] hydrochlorothiazide (HYDRODIURIL) 25 MG tablet Take 1 tablet (25 mg total) by mouth daily.  Marland Kitchen gabapentin (NEURONTIN) 300 MG capsule TAKE 1 CAPSULE(300 MG) BY MOUTH AT BEDTIME (Patient not taking: Reported on 06/18/2019)   No facility-administered medications prior to visit.    Review of Systems  Constitutional: Negative.   Eyes: Negative.   Respiratory: Negative.   Cardiovascular: Negative.   Gastrointestinal: Negative.   Endocrine: Negative.     Last lipids Lab Results  Component Value Date   CHOL 173 02/18/2019   HDL 44 02/18/2019   LDLCALC 109 (H) 02/18/2019   TRIG 112 02/18/2019   CHOLHDL 3.9 02/18/2019      Objective  BP (!) 96/58 (BP Location: Left Arm, Patient Position: Sitting, Cuff Size: Normal)   Pulse 69   Temp (!) 97.5 F (36.4 C) (Temporal)   Resp 16   Wt 180 lb (81.6 kg)   BMI 24.41 kg/m  BP Readings from Last 3 Encounters:  06/18/19 (!) 96/58  02/18/19 124/76  12/02/18 (!) 162/78   Wt Readings from Last 3 Encounters:  06/18/19 180 lb (81.6 kg)  02/18/19 185 lb 6.4 oz (84.1 kg)  12/02/18 175 lb (79.4 kg)      Physical Exam Constitutional:      General: He is not in acute distress.    Appearance: Normal appearance. He is not diaphoretic.  HENT:     Head: Normocephalic and atraumatic.  Eyes:     Conjunctiva/sclera: Conjunctivae normal.  Cardiovascular:     Rate and Rhythm: Normal rate and regular rhythm.     Heart sounds: Normal heart sounds.  Pulmonary:     Effort: Pulmonary effort is normal.     Breath sounds: Normal breath sounds.  Musculoskeletal:     Cervical back: Neck supple.     Right lower leg: No edema.     Left lower leg: No edema.  Lymphadenopathy:     Cervical: No cervical adenopathy.  Skin:    General: Skin is warm and dry.     Findings: No  rash.  Neurological:     Mental Status: He is alert and oriented to person, place, and time. Mental status is at baseline.  Psychiatric:        Mood and Affect: Mood normal.        Behavior: Behavior normal.      Results for orders placed or performed in visit on 06/18/19  POCT glycosylated hemoglobin (Hb A1C)  Result Value Ref Range   Hemoglobin A1C 5.8 (A) 4.0 - 5.6 %   Est. average glucose Bld gHb Est-mCnc 120     Assessment & Plan     Problem List Items Addressed This Visit      Cardiovascular and Mediastinum   Essential hypertension, benign    Low today Decrease HCTZ to 12.5 mg Repeat BMP F/u in 1 month      Relevant Medications   hydrochlorothiazide (HYDRODIURIL) 12.5 MG tablet   Senile purpura ([facility])    Continue to monitor      Relevant Medications   hydrochlorothiazide (HYDRODIURIL) 12.5 MG tablet   Hypotension    Blood pressure low today Patient denies any symptoms Decrease hydrochlorothiazide to 12.5 mg daily Check labs today Follow up in 1 month       Relevant Medications   hydrochlorothiazide (HYDRODIURIL) 12.5 MG tablet   Other Relevant Orders   Basic metabolic panel     Endocrine   Diabetes mellitus ([place]) - Primary    Well controlled with last A1c 5.8 Associated with HTN, HLD Diet controlled UTD on vaccines, foot exam Advised on need for eye exam On ACEi On Statin Discussed diet and exercise F/u in 3 months       Relevant Orders   POCT glycosylated hemoglobin (Hb A1C) (Completed)    Other Visit Diagnoses    Long term (current) use of anticoagulants       Relevant Orders   CBC       Return in about 4 weeks (around 07/16/2019) for chronic disease f/u.      I, Lavon Paganini, MD, have reviewed all documentation for this visit. The documentation on 06/18/19 for the exam, diagnosis,  procedures, and orders are all accurate and complete.   Nike Southers, Dionne Bucy, MD, MPH [facility]

## 2019-06-18 NOTE — Assessment & Plan Note (Addendum)
Well controlled with last A1c 5.8 Associated with HTN, HLD Diet controlled UTD on vaccines, foot exam Advised on need for eye exam On ACEi On Statin Discussed diet and exercise F/u in 3 months

## 2019-06-18 NOTE — Assessment & Plan Note (Addendum)
Low today Decrease HCTZ to 12.5 mg Repeat BMP F/u in 1 month

## 2019-06-18 NOTE — Patient Instructions (Signed)
Hypotension As your heart beats, it forces blood through your body. This force is called blood pressure. If you have hypotension, you have low blood pressure. When your blood pressure is too low, you may not get enough blood to your brain or other parts of your body. This may cause you to feel weak, light-headed, have a fast heartbeat, or even pass out (faint). Low blood pressure may be harmless, or it may cause serious problems. What are the causes?  Blood loss.  Not enough water in the body (dehydration).  Heart problems.  Hormone problems.  Pregnancy.  A very bad infection.  Not having enough of certain nutrients.  Very bad allergic reactions.  Certain medicines. What increases the risk?  Age. The risk increases as you get older.  Conditions that affect the heart or the brain and spinal cord (central nervous system).  Taking certain medicines.  Being pregnant. What are the signs or symptoms?  Feeling: ? Weak. ? Light-headed. ? Dizzy. ? Tired (fatigued).  Blurred vision.  Fast heartbeat.  Passing out, in very bad cases. How is this treated?  Changing your diet. This may involve eating more salt (sodium) or drinking more water.  Taking medicines to raise your blood pressure.  Changing how much you take (the dosage) of some of your medicines.  Wearing compression stockings. These stockings help to prevent blood clots and reduce swelling in your legs. In some cases, you may need to go to the hospital for:  Fluid replacement. This means you will receive fluids through an IV tube.  Blood replacement. This means you will receive donated blood through an IV tube (transfusion).  Treating an infection or heart problems, if this applies.  Monitoring. You may need to be monitored while medicines that you are taking wear off. Follow these instructions at home: Eating and drinking   Drink enough fluids to keep your pee (urine) pale yellow.  Eat a healthy diet.  Follow instructions from your doctor about what you can eat or drink. A healthy diet includes: ? Fresh fruits and vegetables. ? Whole grains. ? Low-fat (lean) meats. ? Low-fat dairy products.  Eat extra salt only as told. Do not add extra salt to your diet unless your doctor tells you to.  Eat small meals often.  Avoid standing up quickly after you eat. Medicines  Take over-the-counter and prescription medicines only as told by your doctor. ? Follow instructions from your doctor about changing how much you take of your medicines, if this applies. ? Do not stop or change any of your medicines on your own. General instructions   Wear compression stockings as told by your doctor.  Get up slowly from lying down or sitting.  Avoid hot showers and a lot of heat as told by your doctor.  Return to your normal activities as told by your doctor. Ask what activities are safe for you.  Do not use any products that contain nicotine or tobacco, such as cigarettes, e-cigarettes, and chewing tobacco. If you need help quitting, ask your doctor.  Keep all follow-up visits as told by your doctor. This is important. Contact a doctor if:  You throw up (vomit).  You have watery poop (diarrhea).  You have a fever for more than 2-3 days.  You feel more thirsty than normal.  You feel weak and tired. Get help right away if:  You have chest pain.  You have a fast or uneven heartbeat.  You lose feeling (have numbness) in any   part of your body.  You cannot move your arms or your legs.  You have trouble talking.  You get sweaty or feel light-headed.  You pass out.  You have trouble breathing.  You have trouble staying awake.  You feel mixed up (confused). Summary  Hypotension is also called low blood pressure. It is when the force of blood pumping through your arteries is too weak.  Hypotension may be harmless, or it may cause serious problems.  Treatment may include changing  your diet and medicines, and wearing compression stockings.  In very bad cases, you may need to go to the hospital. This information is not intended to replace advice given to you by your health care provider. Make sure you discuss any questions you have with your health care provider. Document Revised: 06/21/2017 Document Reviewed: 06/21/2017 Elsevier Patient Education  2020 [employer]  

## 2019-06-18 NOTE — Assessment & Plan Note (Signed)
Blood pressure low today Patient denies any symptoms Decrease hydrochlorothiazide to 12.5 mg daily Check labs today Follow up in 1 month

## 2019-06-19 ENCOUNTER — Telehealth: Payer: Self-pay

## 2019-06-19 LAB — BASIC METABOLIC PANEL
BUN/Creatinine Ratio: 11 (ref 10–24)
BUN: 10 mg/dL (ref 8–27)
CO2: 23 mmol/L (ref 20–29)
Calcium: 9.7 mg/dL (ref 8.6–10.2)
Chloride: 101 mmol/L (ref 96–106)
Creatinine, Ser: 0.87 mg/dL (ref 0.76–1.27)
GFR calc Af Amer: 94 mL/min/{1.73_m2} (ref >59)
GFR calc non Af Amer: 82 mL/min/{1.73_m2} (ref >59)
Glucose: 102 mg/dL — ABNORMAL HIGH (ref 65–99)
Potassium: 4 mmol/L (ref 3.5–5.2)
Sodium: 139 mmol/L (ref 134–144)

## 2019-06-19 LAB — CBC
Hematocrit: 47.4 % (ref 37.5–51.0)
Hemoglobin: 16.3 g/dL (ref 13.0–17.7)
MCH: 31 pg (ref 26.6–33.0)
MCHC: 34.4 g/dL (ref 31.5–35.7)
MCV: 90 fL (ref 79–97)
Platelets: 204 10*3/uL (ref 150–450)
RBC: 5.26 x10E6/uL (ref 4.14–5.80)
RDW: 12.2 % (ref 11.6–15.4)
WBC: 5.2 10*3/uL (ref 3.4–10.8)

## 2019-06-19 NOTE — Telephone Encounter (Signed)
LMTCB 06/19/2019. PEC please advise pt of lab results.    Thanks,   -Mickel Baas

## 2019-06-19 NOTE — Telephone Encounter (Signed)
-----   Message from Virginia Crews, MD sent at 06/19/2019 10:32 AM EDT ----- Normal labs

## 2019-07-08 ENCOUNTER — Other Ambulatory Visit: Payer: Self-pay | Admitting: *Deleted

## 2019-07-08 MED ORDER — SILDENAFIL CITRATE 20 MG PO TABS: ORAL_TABLET | ORAL | 3 refills | Status: DC

## 2019-07-18 ENCOUNTER — Other Ambulatory Visit: Payer: Self-pay | Admitting: Family Medicine

## 2019-07-18 MED ORDER — LISINOPRIL 20 MG PO TABS: 20.0000 mg | ORAL_TABLET | Freq: Every day | ORAL | 1 refills | Status: DC

## 2019-07-18 NOTE — Telephone Encounter (Signed)
[facility] faxed refill request for the following medications:   lisinopril (ZESTRIL) 20 MG tablet   Please advise.  Thanks, [employer]

## 2019-07-21 ENCOUNTER — Ambulatory Visit: Payer: Self-pay | Admitting: Family Medicine

## 2019-08-25 ENCOUNTER — Other Ambulatory Visit: Payer: Self-pay

## 2019-08-25 ENCOUNTER — Telehealth: Payer: Self-pay

## 2019-08-25 ENCOUNTER — Ambulatory Visit: Payer: Medicare PPO | Admitting: Family Medicine

## 2019-08-25 ENCOUNTER — Encounter: Payer: Self-pay | Admitting: Family Medicine

## 2019-08-25 DIAGNOSIS — I10 Essential (primary) hypertension: Secondary | ICD-10-CM

## 2019-08-25 DIAGNOSIS — I959 Hypotension, unspecified: Secondary | ICD-10-CM | POA: Diagnosis not present

## 2019-08-25 MED ORDER — GABAPENTIN 300 MG PO CAPS: ORAL_CAPSULE | ORAL | 3 refills | Status: DC

## 2019-08-25 NOTE — Patient Instructions (Addendum)
When you get home, check your Hydrochlorathiazide bottle:  - If the bottle says 25 mg, take 12.5 mg daily  -If the bottle says 12.5mg , STOP taking the medication  Continue all other medications as prescribed  Follow-up in 1 month

## 2019-08-25 NOTE — Progress Notes (Addendum)
Established Patient Office Visit  Subjective:  Patient ID: Todd Diaz, male    DOB: 1940/12/22  Age: 79 y.o. MRN: [id number]  CC:  Chief Complaint  Patient presents with  . Hypotension    HPI Todd Diaz presents for follow up of hypotension. At his previous visit in June, his blood pressure was 98/56, and his hydrochlorathiazide was decreased from 25 to 01.3 mg. Todd Diaz is uncertain if the dose is changed on the bottle.  Todd Diaz reports once a week he feels dizzy/"floozy" after taking his blood pressure medications in the morning. He has not noticed any other changes. He drinks 4-5 bottles of water per day to stay hydrated. He is still working and stays active playing golf once a week.   Past Medical History:  Diagnosis Date  . Arthritis    Knees  . Avascular necrosis of medial femoral condyle ([place])   . Chronic prostatitis   . Diabetes mellitus ([place])    controlled with medication  . Erectile dysfunction   . Hearing loss   . Hyperlipidemia   . Hypertension    controlled with medication  . Hypertrophy of prostate without urinary obstruction and other lower urinary tract symptoms (LUTS)   . Infection of urinary tract    ASSESSMENT:Recurrent MRSA UTI F/B Dr. Clayborn Diaz and Dr. Jacqlyn Diaz  . TIA (transient ischemic attack) 02/23/2014   R sided weakness and numbness; duration 1 hour; evaluated [facility].  . Tobacco use disorder     Past Surgical History:  Procedure Laterality Date  . Admission  03/09/2012   [facility]; 24 hour admission; LE numbness/tingling.  CT head, MRI brain, carotid dopplers, 2D-echo.  . AMPUTATION  2009   L fifth finger surgery for near amputation. [place].  Marland Kitchen BLADDER STONE REMOVAL    . EYE SURGERY  08/09/2013   B cataract surgery; [place]  . HERNIA REPAIR  04/09/2013   3 repaired; [place].  . JOINT REPLACEMENT    . PARTIAL KNEE ARTHROPLASTY  07/24/2011   Procedure: UNICOMPARTMENTAL KNEE;  Surgeon: Lorn Junes, MD;  Location: [facility];  Service: Orthopedics;  Laterality:  Left;  . PROSTATE SURGERY    . RETINAL TEAR REPAIR CRYOTHERAPY  08/09/2013   [name]    Family History  Problem Relation Age of Onset  . Hypertension Mother   . Cancer Mother        lung  . Heart attack Father   . Hypertension Father   . Hyperlipidemia Father   . Aneurysm Father 30       Brain  . Heart disease Father 52       AMI as cause of death  . Diabetes Son   . Healthy Sister     Social History   Socioeconomic History  . Marital status: Widowed    Spouse name: Not on file  . Number of children: 4  . Years of education: 82  . Highest education level: High school graduate  Occupational History  . Occupation: Retired  . Occupation: Engineer, agricultural  Tobacco Use  . Smoking status: Former Smoker    Packs/day: 0.50    Years: 20.00    Pack years: 10.00    Types: Cigarettes    Quit date: 01/09/1986    Years since quitting: 33.6  . Smokeless tobacco: Current User    Types: Snuff  . Tobacco comment: chews about 1/2 a pack of tobacco currently;   Vaping Use  . Vaping Use: Never used  Substance and Sexual Activity  .  Alcohol use: Yes    Alcohol/week: 8.0 - 10.0 standard drinks    Types: 8 - 10 Cans of beer per week    Comment: 4-5 beers per weekend night.  DUI in 2000.  . Drug use: No  . Sexual activity: Not Currently    Partners: Female  Other Topics Concern  . Not on file  Social History Narrative   Widowed since 02/2009 after 35 years second marriage       Children: 6 children, 2 stepchildren; 8 grandchildren, 2 gg.      Lives: with cat, dog.      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      Tobacco: quit smoking age 20; smoked x 22 years.      Alcohol:  Beer every weekend (5-6 beers per night on weekends).      Seatbelt:  Always uses seat belts, Smoke alarm in the home,      Guns: >5 guns in the home loaded and locked up.      Caffeine NWG:NFAOZH, Tea, Carbonated beverages; consumes a moderate amount.      Exercise: Inactive.     Advanced  Directives:  +LIVING WILL; FULL CODE, No HCPOA.      ADLs: independent with ADLs.  Drives.   Social Determinants of Health   Financial Resource Strain:   . Difficulty of Paying Living Expenses:   Food Insecurity:   . Worried About Charity fundraiser in the Last Year:   . Arboriculturist in the Last Year:   Transportation Needs:   . Film/video editor (Medical):   Marland Kitchen Lack of Transportation (Non-Medical):   Physical Activity:   . Days of Exercise per Week:   . Minutes of Exercise per Session:   Stress:   . Feeling of Stress :   Social Connections:   . Frequency of Communication with Friends and Family:   . Frequency of Social Gatherings with Friends and Family:   . Attends Religious Services:   . Active Member of Clubs or Organizations:   . Attends Archivist Meetings:   Marland Kitchen Marital Status:   Intimate Partner Violence:   . Fear of Current or Ex-Partner:   . Emotionally Abused:   Marland Kitchen Physically Abused:   . Sexually Abused:     Outpatient Medications Prior to Visit  Medication Sig Dispense Refill  . aspirin EC 81 MG tablet Take 81 mg by mouth daily.    . Blood Glucose Calibration (ACCU-CHEK COMPACT PLUS CONTROL) SOLN Test blood sugar daily. Dx code: E11.9 1 each 0  . blood glucose meter kit and supplies KIT Dispense based on patient and insurance preference. Check sugar once daily as directed. (FOR ICD-9 250.00, 250.01). 1 each 0  . Blood Glucose Monitoring Suppl (BLOOD GLUCOSE METER KIT AND SUPPLIES) KIT Test blood sugar daily. Dx code: E11.9 1 each 0  . clopidogrel (PLAVIX) 75 MG tablet TAKE 1 TABLET BY MOUTH EVERY DAY 90 tablet 1  . diazepam (VALIUM) 5 MG tablet Take 1/2 to 1 po daily as needed with onset of panic attack 20 tablet 0  . glucose blood test strip Test blood sugar daily. Dx Code: E11.9 100 each 3  . hydrochlorothiazide (HYDRODIURIL) 12.5 MG tablet Take 1 tablet (12.5 mg total) by mouth daily. 30 tablet 3  . Lancets MISC Test blood sugar daily. Dx code:  E11.9 100 each 3  . lisinopril (ZESTRIL) 20 MG tablet Take 1 tablet (20 mg total) by  mouth daily. 90 tablet 1  . lovastatin (MEVACOR) 40 MG tablet Take 1 tablet (40 mg total) by mouth at bedtime. 90 tablet 3  . sildenafil (REVATIO) 20 MG tablet 2-5 tabs 1 hour prior to intercourse 30 tablet 3  . tamsulosin (FLOMAX) 0.4 MG CAPS capsule Take 1 capsule (0.4 mg total) by mouth daily after breakfast. 90 capsule 3  . gabapentin (NEURONTIN) 300 MG capsule TAKE 1 CAPSULE(300 MG) BY MOUTH AT BEDTIME 30 capsule 3   No facility-administered medications prior to visit.    No Known Allergies  ROS Review of Systems  Eyes: Negative for visual disturbance.  Respiratory: Negative for chest tightness and shortness of breath.   Cardiovascular: Negative for chest pain and palpitations.  Endocrine: Positive for cold intolerance.  Genitourinary: Negative for difficulty urinating.  Skin: Positive for wound.  Neurological: Positive for light-headedness. Negative for syncope.      Objective:    Physical Exam Constitutional:      General: He is not in acute distress.    Appearance: Normal appearance. He is normal weight. He is not toxic-appearing or diaphoretic.  HENT:     Head: Atraumatic.     Comments: Cyst on scalp    Right Ear: External ear normal.     Left Ear: External ear normal.     Nose: Nose normal.  Eyes:     General: No scleral icterus.    Comments: "Fat" on the temporal side of both eyes  Cardiovascular:     Rate and Rhythm: Normal rate and regular rhythm.     Heart sounds: Normal heart sounds.  Pulmonary:     Effort: Pulmonary effort is normal. No respiratory distress.     Breath sounds: Normal breath sounds. No stridor. No wheezing.  Abdominal:     General: Abdomen is flat.     Palpations: Abdomen is soft.     Tenderness: There is no abdominal tenderness. There is no guarding or rebound.  Musculoskeletal:        General: No swelling. Normal range of motion.     Cervical back:  Normal range of motion and neck supple.  Skin:    General: Skin is warm and dry.     Findings: Bruising present.     Comments: Purpura on both arms  Neurological:     General: No focal deficit present.     Mental Status: He is alert. Mental status is at baseline.     Motor: No weakness.     Gait: Gait normal.  Psychiatric:        Mood and Affect: Mood normal.        Behavior: Behavior normal.        Thought Content: Thought content normal.        Judgment: Judgment normal.     BP 114/60 (BP Location: Left Arm, Patient Position: Sitting, Cuff Size: Large)   Pulse 93   Temp 97.8 F (36.6 C) (Oral)   Wt 182 lb (82.6 kg)   SpO2 96%   BMI 24.68 kg/m  Wt Readings from Last 3 Encounters:  08/25/19 182 lb (82.6 kg)  06/18/19 180 lb (81.6 kg)  02/18/19 185 lb 6.4 oz (84.1 kg)     Health Maintenance Due  Topic Date Due  . COVID-19 Vaccine (1) Never done  . OPHTHALMOLOGY EXAM  07/15/2015  . INFLUENZA VACCINE  08/10/2019    There are no preventive care reminders to display for this patient.  Lab Results  Component Value  Date   TSH 0.91 06/15/2015   Lab Results  Component Value Date   WBC 5.2 06/18/2019   HGB 16.3 06/18/2019   HCT 47.4 06/18/2019   MCV 90 06/18/2019   PLT 204 06/18/2019   Lab Results  Component Value Date   NA 139 06/18/2019   K 4.0 06/18/2019   CO2 23 06/18/2019   GLUCOSE 102 (H) 06/18/2019   BUN 10 06/18/2019   CREATININE 0.87 06/18/2019   BILITOT 0.5 02/18/2019   ALKPHOS 82 02/18/2019   AST 25 02/18/2019   ALT 22 02/18/2019   PROT 7.1 02/18/2019   ALBUMIN 4.1 02/18/2019   CALCIUM 9.7 06/18/2019   ANIONGAP 8 09/09/2015   Lab Results  Component Value Date   CHOL 173 02/18/2019   Lab Results  Component Value Date   HDL 44 02/18/2019   Lab Results  Component Value Date   LDLCALC 109 (H) 02/18/2019   Lab Results  Component Value Date   TRIG 112 02/18/2019   Lab Results  Component Value Date   CHOLHDL 3.9 02/18/2019   Lab  Results  Component Value Date   HGBA1C 5.8 (A) 06/18/2019      Assessment & Plan:   Problem List Items Addressed This Visit      Cardiovascular and Mediastinum   Essential hypertension, benign    Due to concerns of hypotension: - If HCTZ is 25, cut it to 12.5; if HCTZ is 12.19m, stop the HCTZ  Meds ordered this encounter  Medications  . gabapentin (NEURONTIN) 300 MG capsule    Sig: TAKE 1 CAPSULE(300 MG) BY MOUTH AT BEDTIME    Dispense:  90 capsule    Refill:  3    Follow-up: Return in about 4 weeks (around 09/22/2019) for hypotension.   ADurenda Age Medical Student

## 2019-08-25 NOTE — Telephone Encounter (Signed)
Copied from [place] [phone]. Topic: General - Other >> Aug 25, 2019  2:26 PM Keene Breath wrote: Reason for CRM: Patient called to get clarification on his medication for hydrochlorothiazide (HYDRODIURIL) 12.5 MG tablet.  He stated the doctor changed the dosage but the pharmacy still has the old dosage which is double what the doctor wanted him to take.  He would like to know if he should cut the medication in half

## 2019-08-25 NOTE — Progress Notes (Deleted)
Established patient visit   Patient: Todd Diaz   DOB: 1940-11-10   79 y.o. Male  MRN: [id number] Visit Date: 08/25/2019  Today's healthcare provider: Lavon Paganini, MD   Chief Complaint  Patient presents with  . Hypotension   Subjective    HPI  Follow up for hypotension  The patient was last seen for this 1 months ago. Changes made at last visit include decrease HCTZ to 12.5 mg daily.  He reports excellent compliance with treatment. He feels that condition is Improved. He is not having side effects.   -----------------------------------------------------------------------------------------    Patient Active Problem List   Diagnosis Date Noted  . Senile purpura ([place]) 06/18/2019  . Hypotension 06/18/2019  . Erectile dysfunction due to arterial insufficiency 12/02/2018  . Toenail deformity 11/06/2018  . Bilateral cataracts 11/21/2017  . Chewing tobacco use 11/21/2017  . Nephrogenic adenoma of bladder 10/07/2015  . Starting and stopping of urinary stream during micturition 10/07/2015  . Benign localized hyperplasia of prostate with urinary obstruction 08/11/2015  . Incomplete bladder emptying 08/11/2015  . Phimosis 08/11/2015  . Degenerative disc disease, lumbar 01/12/2015  . Diabetes mellitus ([place]) 01/12/2015  . History of TIA (transient ischemic attack) 01/12/2015  . Right leg numbness 08/18/2014  . Bilateral inguinal hernia (BIH) s/p lap reapir w mesh 03/03/2013 02/26/2013  . Panic attack 05/20/2012  . Essential hypertension, benign 12/26/2011  . Impotence 12/26/2011  . Hyperlipidemia associated with type 2 diabetes mellitus ([place]) 12/26/2011  . Avascular necrosis of medial femoral condyle ([facility])    Social History   Tobacco Use  . Smoking status: Former Smoker    Packs/day: 0.50    Years: 20.00    Pack years: 10.00    Types: Cigarettes    Quit date: 01/09/1986    Years since quitting: 33.6  . Smokeless tobacco: Current User    Types: Snuff  .  Tobacco comment: chews about 1/2 a pack of tobacco currently;   Vaping Use  . Vaping Use: Never used  Substance Use Topics  . Alcohol use: Yes    Alcohol/week: 8.0 - 10.0 standard drinks    Types: 8 - 10 Cans of beer per week    Comment: 4-5 beers per weekend night.  DUI in 2000.  . Drug use: No   No Known Allergies     Medications: Outpatient Medications Prior to Visit  Medication Sig  . aspirin EC 81 MG tablet Take 81 mg by mouth daily.  . Blood Glucose Calibration (ACCU-CHEK COMPACT PLUS CONTROL) SOLN Test blood sugar daily. Dx code: E11.9  . blood glucose meter kit and supplies KIT Dispense based on patient and insurance preference. Check sugar once daily as directed. (FOR ICD-9 250.00, 250.01).  . Blood Glucose Monitoring Suppl (BLOOD GLUCOSE METER KIT AND SUPPLIES) KIT Test blood sugar daily. Dx code: E11.9  . clopidogrel (PLAVIX) 75 MG tablet TAKE 1 TABLET BY MOUTH EVERY DAY  . diazepam (VALIUM) 5 MG tablet Take 1/2 to 1 po daily as needed with onset of panic attack  . gabapentin (NEURONTIN) 300 MG capsule TAKE 1 CAPSULE(300 MG) BY MOUTH AT BEDTIME  . glucose blood test strip Test blood sugar daily. Dx Code: E11.9  . hydrochlorothiazide (HYDRODIURIL) 12.5 MG tablet Take 1 tablet (12.5 mg total) by mouth daily.  . Lancets MISC Test blood sugar daily. Dx code: E11.9  . lisinopril (ZESTRIL) 20 MG tablet Take 1 tablet (20 mg total) by mouth daily.  Marland Kitchen lovastatin (MEVACOR) 40 MG  tablet Take 1 tablet (40 mg total) by mouth at bedtime.  . sildenafil (REVATIO) 20 MG tablet 2-5 tabs 1 hour prior to intercourse  . tamsulosin (FLOMAX) 0.4 MG CAPS capsule Take 1 capsule (0.4 mg total) by mouth daily after breakfast.   No facility-administered medications prior to visit.    Review of Systems  Constitutional: Negative.   Respiratory: Negative.   Cardiovascular: Negative.   Neurological: Positive for light-headedness. Negative for dizziness and headaches.    Last metabolic panel Lab  Results  Component Value Date   GLUCOSE 102 (H) 06/18/2019   NA 139 06/18/2019   K 4.0 06/18/2019   CL 101 06/18/2019   CO2 23 06/18/2019   BUN 10 06/18/2019   CREATININE 0.87 06/18/2019   GFRNONAA 82 06/18/2019   GFRAA 94 06/18/2019   CALCIUM 9.7 06/18/2019   PROT 7.1 02/18/2019   ALBUMIN 4.1 02/18/2019   LABGLOB 3.0 02/18/2019   AGRATIO 1.4 02/18/2019   BILITOT 0.5 02/18/2019   ALKPHOS 82 02/18/2019   AST 25 02/18/2019   ALT 22 02/18/2019   ANIONGAP 8 09/09/2015      Objective    BP 114/60 (BP Location: Left Arm, Patient Position: Sitting, Cuff Size: Large)   Pulse 93   Temp 97.8 F (36.6 C) (Oral)   Wt 182 lb (82.6 kg)   SpO2 96%   BMI 24.68 kg/m  BP Readings from Last 3 Encounters:  08/25/19 114/60  06/18/19 (!) 96/58  02/18/19 124/76   Wt Readings from Last 3 Encounters:  08/25/19 182 lb (82.6 kg)  06/18/19 180 lb (81.6 kg)  02/18/19 185 lb 6.4 oz (84.1 kg)      Physical Exam  ***  No results found for any visits on 08/25/19.  Assessment & Plan     ***  No follow-ups on file.      {provider attestation***:1}   Lavon Paganini, MD  [facility] [phone] (phone) [phone] (fax)  [place]

## 2019-08-26 ENCOUNTER — Encounter: Payer: Self-pay | Admitting: Family Medicine

## 2019-08-26 NOTE — Telephone Encounter (Signed)
Pt. Reports he has been taking the whole tablet of 25 mg. Will cut the tablet in half for 12.5 mg dose. Instructed to call back if any problems arise.

## 2019-08-26 NOTE — Telephone Encounter (Signed)
lmtcb okay for [facility] triage nurse to advise patient that in system HCTZ is 1 tablet by mouth daily at 12.5mg . According to yesterdays office note it is written  "Plan to further cut back on HCTZ (he is unsure if he cut back from 25 to 12.5mg  daily after last visit) - will d/c if he is currently taking 12.5mg  daily"

## 2019-08-26 NOTE — Assessment & Plan Note (Signed)
Remains on the low end of normal Still having intermittent symptoms of hypotension Plan to further cut back on HCTZ (he is unsure if he cut back from 25 to 12.5mg  daily after last visit) - will d/c if he is currently taking 12.5mg  daily Reviewed last metabolic panel-recheck at next visit Follow-up in 1 month and reassess Return precautions discussed

## 2019-08-26 NOTE — Assessment & Plan Note (Signed)
Continues to have intermittent symptoms of hypotension with low normal blood pressure Decrease HCTZ again as above or DC if he is taking 12.5 mg daily Follow-up in 1 month and reassess

## 2019-09-02 ENCOUNTER — Other Ambulatory Visit: Payer: Self-pay | Admitting: Neurology

## 2019-09-08 ENCOUNTER — Other Ambulatory Visit: Payer: Self-pay | Admitting: Family Medicine

## 2019-09-08 NOTE — Telephone Encounter (Signed)
[facility] faxed refill request for the following medications:  clopidogrel (PLAVIX) 75 MG tablet  90 days supply Last Rx: 10/30/2018 by another provider outside [facility] I don't see that any provider at [facility] has scribed this medication for pt. LOV: 08/25/2019 Please advise. Thanks TNP

## 2019-09-09 MED ORDER — CLOPIDOGREL BISULFATE 75 MG PO TABS: 75.0000 mg | ORAL_TABLET | Freq: Every day | ORAL | 1 refills | Status: DC

## 2019-10-01 ENCOUNTER — Ambulatory Visit: Payer: Medicare PPO | Admitting: Family Medicine

## 2019-10-06 ENCOUNTER — Encounter: Payer: Self-pay | Admitting: Family Medicine

## 2019-10-06 ENCOUNTER — Other Ambulatory Visit: Payer: Self-pay

## 2019-10-06 ENCOUNTER — Ambulatory Visit: Payer: Medicare PPO | Admitting: Family Medicine

## 2019-10-06 VITALS — BP 152/81 | HR 80 | Temp 98.3°F | Wt 189.0 lb

## 2019-10-06 DIAGNOSIS — Z23 Encounter for immunization: Secondary | ICD-10-CM

## 2019-10-06 DIAGNOSIS — E1169 Type 2 diabetes mellitus with other specified complication: Secondary | ICD-10-CM

## 2019-10-06 DIAGNOSIS — E1159 Type 2 diabetes mellitus with other circulatory complications: Secondary | ICD-10-CM

## 2019-10-06 DIAGNOSIS — I10 Essential (primary) hypertension: Secondary | ICD-10-CM

## 2019-10-06 DIAGNOSIS — F1010 Alcohol abuse, uncomplicated: Secondary | ICD-10-CM | POA: Diagnosis not present

## 2019-10-06 DIAGNOSIS — E785 Hyperlipidemia, unspecified: Secondary | ICD-10-CM | POA: Diagnosis not present

## 2019-10-06 DIAGNOSIS — I152 Hypertension secondary to endocrine disorders: Secondary | ICD-10-CM

## 2019-10-06 LAB — POCT GLYCOSYLATED HEMOGLOBIN (HGB A1C): Hemoglobin A1C: 5.5 % (ref 4.0–5.6)

## 2019-10-06 NOTE — Assessment & Plan Note (Addendum)
Elevated, uncontrolled Continue current medications, but resume HCTZ 12.5 mg daily that he stopped after last visit Check home BPs Recheck metabolic panel F/u in 1 month at upcoming CPE

## 2019-10-06 NOTE — Assessment & Plan Note (Signed)
Previously well controlled Continue statin Repeat FLP and CMP Goal LDL < 70 

## 2019-10-06 NOTE — Patient Instructions (Addendum)
Take lisinopril 20mg  daily Move lovastatin to the evening Restart HCTZ 12.5mg  daily because BP is high today Check home BP if able at least 2 x/wk

## 2019-10-06 NOTE — Progress Notes (Signed)
Established patient visit   Patient: Todd Diaz   DOB: 07/11/1940   79 y.o. Male  MRN: [id number] Visit Date: 10/06/2019  Today's healthcare provider: Lavon Paganini, MD   Chief Complaint  Patient presents with  . Diabetes  . Hyperlipidemia  . Hypertension   Subjective    HPI  Diabetes Mellitus Type II, follow-up  Lab Results  Component Value Date   HGBA1C 5.5 10/06/2019   HGBA1C 5.8 (A) 06/18/2019   HGBA1C 5.6 02/18/2019   Last seen for diabetes 3 months ago.  Management since then includes continuing the same treatment. He reports excellent compliance with treatment. He is not having side effects.   Home blood sugar records: not checked --------------------------------------------------------------------------------------------------- Hypertension, follow-up  BP Readings from Last 3 Encounters:  10/06/19 (!) 152/81  08/25/19 114/60  06/18/19 (!) 96/58   Wt Readings from Last 3 Encounters:  10/06/19 189 lb (85.7 kg)  08/25/19 182 lb (82.6 kg)  06/18/19 180 lb (81.6 kg)     He was last seen for hypertension 1 months ago.  BP at that visit was 114/60. Management since that visit includes reducing HCTZ. Pt states he completely stopped HCTZ   He reports excellent compliance with treatment. He is not having side effects.  He is exercising. He is adherent to low salt diet.   Outside blood pressures are not being checked.  He does not smoke.  Use of agents associated with hypertension: none.   --------------------------------------------------------------------------------------------------- Lipid/Cholesterol, follow-up  Last Lipid Panel: Lab Results  Component Value Date   CHOL 173 02/18/2019   LDLCALC 109 (H) 02/18/2019   HDL 44 02/18/2019   TRIG 112 02/18/2019    He was last seen for this 3 months ago.  Management since that visit includes no changes.  He reports good compliance with treatment. He is not having side effects.   Last  metabolic panel Lab Results  Component Value Date   GLUCOSE 102 (H) 06/18/2019   NA 139 06/18/2019   K 4.0 06/18/2019   BUN 10 06/18/2019   CREATININE 0.87 06/18/2019   GFRNONAA 82 06/18/2019   GFRAA 94 06/18/2019   CALCIUM 9.7 06/18/2019   AST 25 02/18/2019   ALT 22 02/18/2019   The ASCVD Risk score (Goff DC Jr., et al., 2013) failed to calculate for the following reasons:   The 2013 ASCVD risk score is only valid for ages 51 to 58  ---------------------------------------------------------------------------------------------------   Social History   Tobacco Use  . Smoking status: Former Smoker    Packs/day: 0.50    Years: 20.00    Pack years: 10.00    Types: Cigarettes    Quit date: 01/09/1986    Years since quitting: 33.7  . Smokeless tobacco: Current User    Types: Snuff  . Tobacco comment: chews about 1/2 a pack of tobacco currently;   Vaping Use  . Vaping Use: Never used  Substance Use Topics  . Alcohol use: Yes    Alcohol/week: 8.0 - 10.0 standard drinks    Types: 8 - 10 Cans of beer per week    Comment: 4-5 beers per weekend night.  DUI in 2000.  . Drug use: No       Medications: Outpatient Medications Prior to Visit  Medication Sig  . aspirin EC 81 MG tablet Take 81 mg by mouth daily.  . Blood Glucose Calibration (ACCU-CHEK COMPACT PLUS CONTROL) SOLN Test blood sugar daily. Dx code: E11.9  . blood glucose meter kit  and supplies KIT Dispense based on patient and insurance preference. Check sugar once daily as directed. (FOR ICD-9 250.00, 250.01).  . Blood Glucose Monitoring Suppl (BLOOD GLUCOSE METER KIT AND SUPPLIES) KIT Test blood sugar daily. Dx code: E11.9  . clopidogrel (PLAVIX) 75 MG tablet Take 1 tablet (75 mg total) by mouth daily.  . diazepam (VALIUM) 5 MG tablet Take 1/2 to 1 po daily as needed with onset of panic attack  . gabapentin (NEURONTIN) 300 MG capsule TAKE 1 CAPSULE(300 MG) BY MOUTH AT BEDTIME  . glucose blood test strip Test blood sugar  daily. Dx Code: E11.9  . hydrochlorothiazide (HYDRODIURIL) 12.5 MG tablet Take 1 tablet (12.5 mg total) by mouth daily.  . Lancets MISC Test blood sugar daily. Dx code: E11.9  . lisinopril (ZESTRIL) 20 MG tablet Take 1 tablet (20 mg total) by mouth daily.  Marland Kitchen lovastatin (MEVACOR) 40 MG tablet Take 1 tablet (40 mg total) by mouth at bedtime.  . sildenafil (REVATIO) 20 MG tablet 2-5 tabs 1 hour prior to intercourse  . tamsulosin (FLOMAX) 0.4 MG CAPS capsule Take 1 capsule (0.4 mg total) by mouth daily after breakfast.   No facility-administered medications prior to visit.    Review of Systems     Objective    BP (!) 152/81 (BP Location: Left Arm, Patient Position: Sitting, Cuff Size: Large)   Pulse 80   Temp 98.3 F (36.8 C)   Wt 189 lb (85.7 kg)   BMI 25.63 kg/m  BP Readings from Last 3 Encounters:  10/06/19 (!) 152/81  08/25/19 114/60  06/18/19 (!) 96/58   Wt Readings from Last 3 Encounters:  10/06/19 189 lb (85.7 kg)  08/25/19 182 lb (82.6 kg)  06/18/19 180 lb (81.6 kg)      Physical Exam Vitals reviewed.  Constitutional:      General: He is not in acute distress.    Appearance: Normal appearance. He is not diaphoretic.  HENT:     Head: Normocephalic and atraumatic.  Eyes:     General: No scleral icterus.    Conjunctiva/sclera: Conjunctivae normal.  Cardiovascular:     Rate and Rhythm: Normal rate and regular rhythm.     Pulses: Normal pulses.     Heart sounds: Normal heart sounds. No murmur heard.   Pulmonary:     Effort: Pulmonary effort is normal. No respiratory distress.     Breath sounds: Normal breath sounds. No wheezing or rhonchi.  Abdominal:     General: There is no distension.     Palpations: Abdomen is soft.     Tenderness: There is no abdominal tenderness.  Musculoskeletal:     Cervical back: Neck supple.     Right lower leg: No edema.     Left lower leg: No edema.  Lymphadenopathy:     Cervical: No cervical adenopathy.  Skin:    General:  Skin is warm and dry.     Capillary Refill: Capillary refill takes less than 2 seconds.     Findings: No rash.  Neurological:     Mental Status: He is alert and oriented to person, place, and time.     Cranial Nerves: No cranial nerve deficit.  Psychiatric:        Mood and Affect: Mood normal.        Behavior: Behavior normal.       Results for orders placed or performed in visit on 10/06/19  POCT glycosylated hemoglobin (Hb A1C)  Result Value Ref Range  Hemoglobin A1C 5.5 4.0 - 5.6 %    Assessment & Plan     Problem List Items Addressed This Visit      Cardiovascular and Mediastinum   Essential hypertension, benign    Elevated, uncontrolled Continue current medications, but resume HCTZ 12.5 mg daily that he stopped after last visit Check home BPs Recheck metabolic panel F/u in 1 month at upcoming CPE        Endocrine   Hyperlipidemia associated with type 2 diabetes mellitus ([place])    Previously well controlled Continue statin Repeat FLP and CMP Goal LDL < 70      Relevant Orders   Lipid panel   Comprehensive metabolic panel   Diabetes mellitus ([place]) - Primary    Well controlled Associated with HTN and HLD Diet controlled UTD on vaccines and foot exam Referral for eye exam On ACEi and statin Discussed diet and exercise F/u in 6 months      Relevant Orders   POCT glycosylated hemoglobin (Hb A1C) (Completed)   Ambulatory referral to Ophthalmology     Other   Alcohol use disorder, mild, abuse    Discussed appropriate level of intake Avoid more than 3 drinks per day       Other Visit Diagnoses    Need for influenza vaccination       Relevant Orders   Flu Vaccine QUAD High Dose(Fluad) (Completed)       Return in about 4 weeks (around 11/03/2019) for CPE, AWV.      I, Lavon Paganini, MD, have reviewed all documentation for this visit. The documentation on 10/06/19 for the exam, diagnosis, procedures, and orders are all accurate and  complete.   Bacigalupo, Dionne Bucy, MD, MPH [facility]

## 2019-10-06 NOTE — Assessment & Plan Note (Signed)
Discussed appropriate level of intake Avoid more than 3 drinks per day

## 2019-10-06 NOTE — Assessment & Plan Note (Signed)
Well controlled Associated with HTN and HLD Diet controlled UTD on vaccines and foot exam Referral for eye exam On ACEi and statin Discussed diet and exercise F/u in 6 months

## 2019-10-07 ENCOUNTER — Telehealth: Payer: Self-pay

## 2019-10-07 LAB — COMPREHENSIVE METABOLIC PANEL
ALT: 17 IU/L (ref 0–44)
AST: 22 IU/L (ref 0–40)
Albumin/Globulin Ratio: 1.5 (ref 1.2–2.2)
Albumin: 4 g/dL (ref 3.7–4.7)
Alkaline Phosphatase: 99 IU/L (ref 44–121)
BUN/Creatinine Ratio: 10 (ref 10–24)
BUN: 9 mg/dL (ref 8–27)
Bilirubin Total: 0.3 mg/dL (ref 0.0–1.2)
CO2: 23 mmol/L (ref 20–29)
Calcium: 9.1 mg/dL (ref 8.6–10.2)
Chloride: 103 mmol/L (ref 96–106)
Creatinine, Ser: 0.89 mg/dL (ref 0.76–1.27)
GFR calc Af Amer: 93 mL/min/{1.73_m2} (ref >59)
GFR calc non Af Amer: 81 mL/min/{1.73_m2} (ref >59)
Globulin, Total: 2.6 g/dL (ref 1.5–4.5)
Glucose: 155 mg/dL — ABNORMAL HIGH (ref 65–99)
Potassium: 3.8 mmol/L (ref 3.5–5.2)
Sodium: 139 mmol/L (ref 134–144)
Total Protein: 6.6 g/dL (ref 6.0–8.5)

## 2019-10-07 LAB — LIPID PANEL
Chol/HDL Ratio: 4.4 ratio (ref 0.0–5.0)
Cholesterol, Total: 148 mg/dL (ref 100–199)
HDL: 34 mg/dL — ABNORMAL LOW (ref >39)
LDL Chol Calc (NIH): 77 mg/dL (ref 0–99)
Triglycerides: 223 mg/dL — ABNORMAL HIGH (ref 0–149)
VLDL Cholesterol Cal: 37 mg/dL (ref 5–40)

## 2019-10-07 NOTE — Telephone Encounter (Signed)
Pt advised.   Thanks,   -Tsuruko Murtha  

## 2019-10-07 NOTE — Telephone Encounter (Signed)
-----   Message from Virginia Crews, MD sent at 10/07/2019  2:06 PM EDT ----- Normal labs

## 2019-10-16 ENCOUNTER — Ambulatory Visit (INDEPENDENT_AMBULATORY_CARE_PROVIDER_SITE_OTHER): Payer: Medicare PPO

## 2019-10-16 ENCOUNTER — Other Ambulatory Visit: Payer: Self-pay

## 2019-10-16 DIAGNOSIS — Z23 Encounter for immunization: Secondary | ICD-10-CM | POA: Diagnosis not present

## 2019-11-05 NOTE — Progress Notes (Signed)
Subjective:   Todd Diaz is a 79 y.o. male who presents for Medicare Annual/Subsequent preventive examination.  Review of Systems    N/A  Cardiac Risk Factors include: diabetes mellitus;advanced age (>63mn, >>4women);dyslipidemia;male gender;hypertension;sedentary lifestyle     Objective:    Today's Vitals   11/06/19 0854 11/06/19 0855  BP: 136/62   Pulse: (!) 47   Temp: 98.2 F (36.8 C)   TempSrc: Oral   SpO2: 93%   Weight: 188 lb (85.3 kg)   Height: 6' (1.829 m)   PainSc: 5  5   PainLoc: Neck    Body mass index is 25.5 kg/m.  Advanced Directives 11/06/2019 07/17/2018 07/11/2017 06/15/2015 03/09/2015 01/11/2015 05/05/2014  Does Patient Have a Medical Advance Directive? Yes Yes Yes Yes Yes Yes Yes  Type of AParamedicof [place]Living will HM[place]Living will HYampaLiving will HSienna PlantationLiving will HBix[place]Living will - [place]qu[place]Living will;Advance instruction for mental health treatment;Mental Health Advance Directive  Does patient want to make changes to medical advance directive? - - - - No - Patient declined - No - Patient declined  Copy of HFarsonin Chart? No - copy requested No - copy requested No - copy requested No - copy requested Yes No - copy requested Yes  Would patient like information on creating a medical advance directive? - - - - - - -  Pre-existing out of facility DNR order (yellow form or pink MOST form) - - - - - - -    Current Medications (verified) Outpatient Encounter Medications as of 11/06/2019  Medication Sig   aspirin EC 81 MG tablet Take 81 mg by mouth daily.   Blood Glucose Calibration (ACCU-CHEK COMPACT PLUS CONTROL) SOLN Test blood sugar daily. Dx code: E11.9   Blood Glucose Monitoring Suppl (BLOOD GLUCOSE METER KIT AND SUPPLIES) KIT Test blood sugar daily. Dx code: E11.9   clopidogrel  (PLAVIX) 75 MG tablet Take 1 tablet (75 mg total) by mouth daily.   diazepam (VALIUM) 5 MG tablet Take 1/2 to 1 po daily as needed with onset of panic attack   gabapentin (NEURONTIN) 300 MG capsule TAKE 1 CAPSULE(300 MG) BY MOUTH AT BEDTIME   glucose blood test strip Test blood sugar daily. Dx Code: E11.9   hydrochlorothiazide (HYDRODIURIL) 12.5 MG tablet Take 1 tablet (12.5 mg total) by mouth daily.   Lancets MISC Test blood sugar daily. Dx code: E11.9   lisinopril (ZESTRIL) 20 MG tablet Take 1 tablet (20 mg total) by mouth daily.   lovastatin (MEVACOR) 40 MG tablet Take 1 tablet (40 mg total) by mouth at bedtime.   sildenafil (REVATIO) 20 MG tablet 2-5 tabs 1 hour prior to intercourse   tamsulosin (FLOMAX) 0.4 MG CAPS capsule Take 1 capsule (0.4 mg total) by mouth daily after breakfast.   blood glucose meter kit and supplies KIT Dispense based on patient and insurance preference. Check sugar once daily as directed. (FOR ICD-9 250.00, 250.01). (Patient not taking: Reported on 11/06/2019)   No facility-administered encounter medications on file as of 11/06/2019.    Allergies (verified) Patient has no known allergies.   History: Past Medical History:  Diagnosis Date   Arthritis    Knees   Avascular necrosis of medial femoral condyle (HCC)    Chronic prostatitis    Diabetes mellitus ([place]    controlled with medication   Erectile dysfunction    Hearing loss  Hyperlipidemia    Hypertension    controlled with medication   Hypertrophy of prostate without urinary obstruction and other lower urinary tract symptoms (LUTS)    Infection of urinary tract    ASSESSMENT:Recurrent MRSA UTI F/B Dr. Clayborn Bigness and Dr. Jacqlyn Larsen   TIA (transient ischemic attack) 02/23/2014   R sided weakness and numbness; duration 1 hour; evaluated [facility].   Tobacco use disorder    Past Surgical History:  Procedure Laterality Date   Admission  03/09/2012   [place]; 24 hour admission; LE  numbness/tingling.  CT head, MRI brain, carotid dopplers, 2D-echo.   AMPUTATION  2009   L fifth finger surgery for near amputation. [place].   BLADDER STONE REMOVAL     EYE SURGERY  08/09/2013   B cataract surgery; [place]  04/09/2013   3 repaired; [place].   JOINT REPLACEMENT     PARTIAL KNEE ARTHROPLASTY  07/24/2011   Procedure: UNICOMPARTMENTAL KNEE;  Surgeon: Lorn Junes, MD;  Location: [facility];  Service: Orthopedics;  Laterality: Left;   PROSTATE SURGERY     RETINAL TEAR REPAIR CRYOTHERAPY  08/09/2013   [name]   Family History  Problem Relation Age of Onset   Hypertension Mother    Cancer Mother        lung   Heart attack Father    Hypertension Father    Hyperlipidemia Father    Aneurysm Father 11       Brain   Heart disease Father 56       AMI as cause of death   Diabetes Son    Healthy Sister    Social History   Socioeconomic History   Marital status: Widowed    Spouse name: Not on file   Number of children: 4   Years of education: 12   Highest education level: High school graduate  Occupational History   Occupation: Engineer, agricultural  Tobacco Use   Smoking status: Former Smoker    Packs/day: 0.50    Years: 20.00    Pack years: 10.00    Types: Cigarettes    Quit date: 01/09/1986    Years since quitting: 33.8   Smokeless tobacco: Current User    Types: Chew   Tobacco comment: chews about 1/2 a pack of tobacco currently;   Vaping Use   Vaping Use: Never used  Substance and Sexual Activity   Alcohol use: Yes    Alcohol/week: 8.0 - 10.0 standard drinks    Types: 8 - 10 Cans of beer per week    Comment: 4-5 beers per weekend night.  DUI in 2000.   Drug use: No   Sexual activity: Not Currently    Partners: Female  Other Topics Concern   Not on file  Social History Narrative   Widowed since 02/2009 after 86 years second marriage       Children: 6 children, 2 stepchildren; 8 grandchildren, 2 gg.      Lives: with  cat, dog.      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      Tobacco: quit smoking age 74; smoked x 22 years.      Alcohol:  Beer every weekend (5-6 beers per night on weekends).      Seatbelt:  Always uses seat belts, Smoke alarm in the home,      Guns: >5 guns in the home loaded and locked up.      Caffeine TIR:WERXVQ, Tea, Carbonated beverages; consumes a moderate amount.  Exercise: Inactive.     Advanced Directives:  +LIVING WILL; FULL CODE, No HCPOA.      ADLs: independent with ADLs.  Drives.   Social Determinants of Health   Financial Resource Strain: Low Risk    Difficulty of Paying Living Expenses: Not hard at all  Food Insecurity: No Food Insecurity   Worried About Charity fundraiser in the Last Year: Never true   [place] in the Last Year: Never true  Transportation Needs: No Transportation Needs   Lack of Transportation (Medical): No   Lack of Transportation (Non-Medical): No  Physical Activity: Inactive   Days of Exercise per Week: 0 days   Minutes of Exercise per Session: 0 min  Stress: No Stress Concern Present   Feeling of Stress : Not at all  Social Connections: Socially Isolated   Frequency of Communication with Friends and Family: More than three times a week   Frequency of Social Gatherings with Friends and Family: More than three times a week   Attends Religious Services: Never   Marine scientist or Organizations: No   Attends Archivist Meetings: Never   Marital Status: Widowed    Tobacco Counseling Ready to quit: Not Answered Counseling given: Not Answered Comment: chews about 1/2 a pack of tobacco currently;    Clinical Intake:  Pre-visit preparation completed: Yes  Pain : 0-10 Pain Score: 5  Pain Type: Acute pain Pain Location: Neck Pain Descriptors / Indicators: Aching Pain Frequency: Intermittent Pain Relieving Factors: Using icey hot as needed for pain.  Pain Relieving Factors: Using  icey hot as needed for pain.  Nutritional Risks: None Diabetes: Yes  How often do you need to have someone help you when you read instructions, pamphlets, or other written materials from your doctor or pharmacy?: 1 - Never  Diabetic? Yes  Nutrition Risk Assessment:  Has the patient had any N/V/D within the last 2 months?  No  Does the patient have any non-healing wounds?  No  Has the patient had any unintentional weight loss or weight gain?  No   Diabetes:  Is the patient diabetic?  Yes  If diabetic, was a CBG obtained today?  No  Did the patient bring in their glucometer from home?  No  How often do you monitor your CBG's? Once a day.   Financial Strains and Diabetes Management:  Are you having any financial strains with the device, your supplies or your medication? No .  Does the patient want to be seen by Chronic Care Management for management of their diabetes?  No  Would the patient like to be referred to a Nutritionist or for Diabetic Management?  No   Diabetic Exams:  Diabetic Eye Exam: Overdue for diabetic eye exam. Pt has been advised about the importance in completing this exam. Patient advised to call and schedule an eye exam. Ordered 10/06/19. Apt scheduled with Dr Ellin Mayhew 11/2019. Diabetic Foot Exam: Overdue, Pt has been advised about the importance in completing this exam. Note made to follow up on this at today's in office apt.    Interpreter Needed?: No  Information entered by :: [facility], LPN   Activities of Daily Living In your present state of health, do you have any difficulty performing the following activities: 11/06/2019 10/06/2019  Hearing? Y Y  Comment Has hearing loss. Does not wear hearing aids. -  Vision? N N  Difficulty concentrating or making decisions? N N  Walking or climbing stairs?  N N  Dressing or bathing? N N  Doing errands, shopping? N N  Preparing Food and eating ? N -  Using the Toilet? N -  In the past six months, have you  accidently leaked urine? N -  Do you have problems with loss of bowel control? N -  Managing your Medications? N -  Managing your Finances? N -  Housekeeping or managing your Housekeeping? N -  Some recent data might be hidden    Patient Care Team: Brita Romp Dionne Bucy, MD as PCP - General (Family Medicine) Pieter Partridge, DO as Consulting Physician (Neurology) [name], MD as Consulting Physician (Urology) Elsie Saas, MD as Consulting Physician (Orthopedic Surgery) Anell Barr, OD as Consulting Physician (Optometry)  Indicate any recent Medical Services you may have received from other than [facility] providers in the past year (date may be approximate).     Assessment:   This is a routine wellness examination for [place].  Hearing/Vision screen No exam data present  Dietary issues and exercise activities discussed: Current Exercise Habits: The patient has a physically strenuous job, but has no regular exercise apart from work., Exercise limited by: None identified  Goals     Reduce alcohol intake     Recommend to cut back on alcohol intake by half (3-4 beers on the weekend).       Depression Screen PHQ 2/9 Scores 11/06/2019 10/06/2019 07/17/2018 07/11/2017 01/18/2017 02/16/2016 06/15/2015  PHQ - 2 Score 0 0 0 0 0 0 0  PHQ- 9 Score 0 1 - - - - -    Fall Risk Fall Risk  11/06/2019 10/06/2019 07/17/2018 02/04/2018 07/11/2017  Falls in the past year? 0 0 0 0 No  Number falls in past yr: 0 0 - - -  Injury with Fall? 0 0 - - -  Risk for fall due to : - No Fall Risks - - -  Follow up - Falls evaluation completed - Falls evaluation completed -    Any stairs in or around the home? Yes  If so, are there any without handrails? No  Home free of loose throw rugs in walkways, pet beds, electrical cords, etc? Yes  Adequate lighting in your home to reduce risk of falls? Yes   ASSISTIVE DEVICES UTILIZED TO PREVENT FALLS:  Life alert? No  Use of a cane, walker or w/c? No  Grab bars  in the bathroom? Yes  Shower chair or bench in shower? Yes  Elevated toilet seat or a handicapped toilet? No   TIMED UP AND GO:  Was the test performed? Yes .  Length of time to ambulate 10 feet: 10 sec.   Gait steady and fast without use of assistive device  Cognitive Function: Declined today.     6CIT Screen 07/11/2017  What Year? 0 points  What month? 0 points  What time? 0 points  Count back from 20 0 points  Months in reverse 2 points  Repeat phrase 4 points  Total Score 6    Immunizations Immunization History  Administered Date(s) Administered   DTaP 03/10/2011   Fluad Quad(high Dose 65+) 11/06/2018, 10/06/2019   Influenza Split 09/10/2010   Influenza, High Dose Seasonal PF 01/18/2017, 11/02/2017   Influenza,inj,Quad PF,6+ Mos 12/11/2012, 10/13/2013, 01/11/2015, 02/05/2016   PFIZER SARS-COV-2 Vaccination 10/16/2019, 11/06/2019   Pneumococcal Conjugate-13 10/13/2013   Pneumococcal-Unspecified 03/10/2011    TDAP status: Up to date Flu Vaccine status: Up to date Pneumococcal vaccine status: Up to date Covid-19  vaccine status: Completed vaccines  Qualifies for Shingles Vaccine? Yes   Zostavax completed No   Shingrix Completed?: No.    Education has been provided regarding the importance of this vaccine. Patient has been advised to call insurance company to determine out of pocket expense if they have not yet received this vaccine. Advised may also receive vaccine at local pharmacy or Health Dept. Verbalized acceptance and understanding.  Screening Tests Health Maintenance  Topic Date Due   OPHTHALMOLOGY EXAM  07/15/2015   FOOT EXAM  11/06/2019   HEMOGLOBIN A1C  04/04/2020   TETANUS/TDAP  12/18/2020   INFLUENZA VACCINE  Completed   COVID-19 Vaccine  Completed   PNA vac Low Risk Adult  Completed    Health Maintenance  Health Maintenance Due  Topic Date Due   OPHTHALMOLOGY EXAM  07/15/2015   FOOT EXAM  11/06/2019    Colorectal cancer  screening: No longer required.   Lung Cancer Screening: (Low Dose CT Chest recommended if Age 41-80 years, 30 pack-year currently smoking OR have quit w/in 15years.) does not qualify.    Additional Screening:  Vision Screening: Recommended annual ophthalmology exams for early detection of glaucoma and other disorders of the eye. Is the patient up to date with their annual eye exam?  Yes  Who is the provider or what is the name of the office in which the patient attends annual eye exams? Dr Ellin Mayhew If pt is not established with a provider, would they like to be referred to a provider to establish care? No .   Dental Screening: Recommended annual dental exams for proper oral hygiene  Community Resource Referral / Chronic Care Management: CRR required this visit?  No   CCM required this visit?  No      Plan:     I have personally reviewed and noted the following in the patients chart:    Medical and social history  Use of alcohol, tobacco or illicit drugs   Current medications and supplements  Functional ability and status  Nutritional status  Physical activity  Advanced directives  List of other physicians  Hospitalizations, surgeries, and ER visits in previous 12 months  Vitals  Screenings to include cognitive, depression, and falls  Referrals and appointments  In addition, I have reviewed and discussed with patient certain preventive protocols, quality metrics, and best practice recommendations. A written personalized care plan for preventive services as well as general preventive health recommendations were provided to patient.     Hadassa Cermak [place], [place]   08/21/8869   Nurse Notes: Pt needs a diabetic foot exam today.

## 2019-11-06 ENCOUNTER — Encounter: Payer: Self-pay | Admitting: Family Medicine

## 2019-11-06 ENCOUNTER — Ambulatory Visit (INDEPENDENT_AMBULATORY_CARE_PROVIDER_SITE_OTHER): Payer: Medicare PPO

## 2019-11-06 ENCOUNTER — Ambulatory Visit (INDEPENDENT_AMBULATORY_CARE_PROVIDER_SITE_OTHER): Payer: Medicare PPO | Admitting: Family Medicine

## 2019-11-06 ENCOUNTER — Other Ambulatory Visit: Payer: Self-pay

## 2019-11-06 VITALS — BP 136/62 | HR 47

## 2019-11-06 VITALS — BP 136/62 | HR 47 | Temp 98.2°F | Ht 72.0 in | Wt 188.0 lb

## 2019-11-06 DIAGNOSIS — E663 Overweight: Secondary | ICD-10-CM | POA: Diagnosis not present

## 2019-11-06 DIAGNOSIS — Z0000 Encounter for general adult medical examination without abnormal findings: Secondary | ICD-10-CM

## 2019-11-06 DIAGNOSIS — Z23 Encounter for immunization: Secondary | ICD-10-CM | POA: Diagnosis not present

## 2019-11-06 DIAGNOSIS — M25512 Pain in left shoulder: Secondary | ICD-10-CM | POA: Diagnosis not present

## 2019-11-06 DIAGNOSIS — M62838 Other muscle spasm: Secondary | ICD-10-CM | POA: Diagnosis not present

## 2019-11-06 DIAGNOSIS — H9193 Unspecified hearing loss, bilateral: Secondary | ICD-10-CM

## 2019-11-06 DIAGNOSIS — Z720 Tobacco use: Secondary | ICD-10-CM | POA: Diagnosis not present

## 2019-11-06 DIAGNOSIS — E1169 Type 2 diabetes mellitus with other specified complication: Secondary | ICD-10-CM

## 2019-11-06 DIAGNOSIS — M67912 Unspecified disorder of synovium and tendon, left shoulder: Secondary | ICD-10-CM | POA: Diagnosis not present

## 2019-11-06 DIAGNOSIS — M25519 Pain in unspecified shoulder: Secondary | ICD-10-CM | POA: Insufficient documentation

## 2019-11-06 NOTE — Assessment & Plan Note (Deleted)
6 day history of L shoulder pain Assessment: Weakness of external roation, mild tenderness to deep palpation and weak open can test is most consistent with a rotator cuff  Injury or strain of the trapezius muscle Rest, PRN NSAIDs, Icy hot and physical therapy. Surgery is not indicated at this time F/U 6 months unless pains becomes worse of debilatating

## 2019-11-06 NOTE — Assessment & Plan Note (Signed)
Difficulty with masked conversation and lower frequencies Patient expressed interest in hearing aids Refer to audiology

## 2019-11-06 NOTE — Patient Instructions (Signed)
 The CDC recommends two doses of Shingrix (the shingles vaccine) separated by 2 to 6 months for adults age 81 years and older. I recommend checking with your insurance plan regarding coverage for this vaccine.     Preventive Care 65 Years and Older, Male Preventive care refers to lifestyle choices and visits with your health care provider that can promote health and wellness. This includes:  A yearly physical exam. This is also called an annual well check.  Regular dental and eye exams.  Immunizations.  Screening for certain conditions.  Healthy lifestyle choices, such as diet and exercise. What can I expect for my preventive care visit? Physical exam Your health care provider will check:  Height and weight. These may be used to calculate body mass index (BMI), which is a measurement that tells if you are at a healthy weight.  Heart rate and blood pressure.  Your skin for abnormal spots. Counseling Your health care provider may ask you questions about:  Alcohol, tobacco, and drug use.  Emotional well-being.  Home and relationship well-being.  Sexual activity.  Eating habits.  History of falls.  Memory and ability to understand (cognition).  Work and work environment. What immunizations do I need?  Influenza (flu) vaccine  This is recommended every year. Tetanus, diphtheria, and pertussis (Tdap) vaccine  You may need a Td booster every 10 years. Varicella (chickenpox) vaccine  You may need this vaccine if you have not already been vaccinated. Zoster (shingles) vaccine  You may need this after age 60. Pneumococcal conjugate (PCV13) vaccine  One dose is recommended after age 65. Pneumococcal polysaccharide (PPSV23) vaccine  One dose is recommended after age 65. Measles, mumps, and rubella (MMR) vaccine  You may need at least one dose of MMR if you were born in 1957 or later. You may also need a second dose. Meningococcal conjugate (MenACWY)  vaccine  You may need this if you have certain conditions. Hepatitis A vaccine  You may need this if you have certain conditions or if you travel or work in places where you may be exposed to hepatitis A. Hepatitis B vaccine  You may need this if you have certain conditions or if you travel or work in places where you may be exposed to hepatitis B. Haemophilus influenzae type b (Hib) vaccine  You may need this if you have certain conditions. You may receive vaccines as individual doses or as more than one vaccine together in one shot (combination vaccines). Talk with your health care provider about the risks and benefits of combination vaccines. What tests do I need? Blood tests  Lipid and cholesterol levels. These may be checked every 5 years, or more frequently depending on your overall health.  Hepatitis C test.  Hepatitis B test. Screening  Lung cancer screening. You may have this screening every year starting at age 55 if you have a 30-pack-year history of smoking and currently smoke or have quit within the past 15 years.  Colorectal cancer screening. All adults should have this screening starting at age 81 and continuing until age 75. Your health care provider may recommend screening at age 45 if you are at increased risk. You will have tests every 1-10 years, depending on your results and the type of screening test.  Prostate cancer screening. Recommendations will vary depending on your family history and other risks.  Diabetes screening. This is done by checking your blood sugar (glucose) after you have not eaten for a while (fasting). You may   may have this done every 1-3 years.  Abdominal aortic aneurysm (AAA) screening. You may need this if you are a current or former smoker.  Sexually transmitted disease (STD) testing. Follow these instructions at home: Eating and drinking  Eat a diet that includes fresh fruits and vegetables, whole grains, lean protein, and low-fat dairy  products. Limit your intake of foods with high amounts of sugar, saturated fats, and salt.  Take vitamin and mineral supplements as recommended by your health care provider.  Do not drink alcohol if your health care provider tells you not to drink.  If you drink alcohol: ? Limit how much you have to 0-2 drinks a day. ? Be aware of how much alcohol is in your drink. In the [place] one drink equals one 12 oz bottle of beer (355 mL), one 5 oz glass of wine (148 mL), or one 1 oz glass of hard liquor (44 mL). Lifestyle  Take daily care of your teeth and gums.  Stay active. Exercise for at least 30 minutes on 5 or more days each week.  Do not use any products that contain nicotine or tobacco, such as cigarettes, e-cigarettes, and chewing tobacco. If you need help quitting, ask your health care provider.  If you are sexually active, practice safe sex. Use a condom or other form of protection to prevent STIs (sexually transmitted infections).  Talk with your health care provider about taking a low-dose aspirin or statin. What's next?  Visit your health care provider once a year for a well check visit.  Ask your health care provider how often you should have your eyes and teeth checked.  Stay up to date on all vaccines. This information is not intended to replace advice given to you by your health care provider. Make sure you discuss any questions you have with your health care provider. Document Revised: 12/20/2017 Document Reviewed: 12/20/2017 Elsevier Patient Education  2020 [employer].

## 2019-11-06 NOTE — Assessment & Plan Note (Signed)
6 day history of L shoulder pain Assessment: Weakness of external roation, mild tenderness to deep palpation and weak open can test is most consistent with a rotator cuff  Injury or strain of the trapezius muscle Rest, PRN NSAIDs, Icy hot and physical therapy. Surgery is not indicated at this time F/U 6 months unless pains becomes worse of debilatating

## 2019-11-06 NOTE — Progress Notes (Signed)
Complete physical exam   Patient: Todd Diaz   DOB: November 12, 1940   79 y.o. Male  MRN: [id number] Visit Date: 11/06/2019  Today's healthcare provider: Lavon Paganini, MD   Chief Complaint  Patient presents with  . Annual Exam  . Neck Pain   Subjective    Todd Diaz is a 79 y.o. male who presents today for a complete physical exam.  He reports consuming a general diet. The patient has a physically strenuous job, but has no regular exercise apart from work.  He generally feels well. He reports sleeping well. Today he wished to discuss some shoulder pain that has been bothering him for the past week. The pain is localized the left shoulder behin his neck. 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 The pain is described as a deep ache aggravated by heavy lifting and non-radiating. He has been using icy hot that has given him minimal relief. He has not had shoulder pain like this before.  Additionally, he has been drinking 5 beers on weekends while occassionally singing karaoke. He also chews tobacco and completes a pack in about 3 days.   Past Medical History:  Diagnosis Date  . Arthritis    Knees  . Avascular necrosis of medial femoral condyle ([place])   . Chronic prostatitis   . Diabetes mellitus ([place])    controlled with medication  . Erectile dysfunction   . Hearing loss   . Hyperlipidemia   . Hypertension    controlled with medication  . Hypertrophy of prostate without urinary obstruction and other lower urinary tract symptoms (LUTS)   . Infection of urinary tract    ASSESSMENT:Recurrent MRSA UTI F/B Dr. Clayborn Bigness and Dr. Jacqlyn Larsen  . TIA (transient ischemic attack) 02/23/2014   R sided weakness and numbness; duration 1 hour; evaluated [facility].  . Tobacco use disorder    Past Surgical History:  Procedure Laterality Date  . Admission  03/09/2012   [facility]; 24 hour admission; LE numbness/tingling.  CT head, MRI brain, carotid dopplers, 2D-echo.  .  AMPUTATION  2009   L fifth finger surgery for near amputation. [place].  Marland Kitchen BLADDER STONE REMOVAL    . EYE SURGERY  08/09/2013   B cataract surgery; [place]  . HERNIA REPAIR  04/09/2013   3 repaired; [place].  . JOINT REPLACEMENT    . PARTIAL KNEE ARTHROPLASTY  07/24/2011   Procedure: UNICOMPARTMENTAL KNEE;  Surgeon: Lorn Junes, MD;  Location: [facility];  Service: Orthopedics;  Laterality: Left;  . PROSTATE SURGERY    . RETINAL TEAR REPAIR CRYOTHERAPY  08/09/2013   [name]   Social History   Socioeconomic History  . Marital status: Widowed    Spouse name: Not on file  . Number of children: 4  . Years of education: 37  . Highest education level: High school graduate  Occupational History  . Occupation: Engineer, agricultural  Tobacco Use  . Smoking status: Former Smoker    Packs/day: 0.50    Years: 20.00    Pack years: 10.00    Types: Cigarettes    Quit date: 01/09/1986    Years since quitting: 33.8  . Smokeless tobacco: Current User    Types: Chew  . Tobacco comment: chews about 1/2 a pack of tobacco currently;   Vaping Use  . Vaping Use: Never used  Substance and Sexual Activity  . Alcohol use: Yes    Alcohol/week: 8.0 - 10.0 standard drinks  Types: 8 - 10 Cans of beer per week    Comment: 4-5 beers per weekend night.  DUI in 2000.  . Drug use: No  . Sexual activity: Not Currently    Partners: Female  Other Topics Concern  . Not on file  Social History Narrative   Widowed since 02/2009 after 16 years second marriage       Children: 6 children, 2 stepchildren; 8 grandchildren, 2 gg.      Lives: with cat, dog.      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      Tobacco: quit smoking age 56; smoked x 22 years.      Alcohol:  Beer every weekend (5-6 beers per night on weekends).      Seatbelt:  Always uses seat belts, Smoke alarm in the home,      Guns: >5 guns in the home loaded and locked up.      Caffeine YQM:VHQION, Tea, Carbonated beverages; consumes a moderate  amount.      Exercise: Inactive.     Advanced Directives:  +LIVING WILL; FULL CODE, No HCPOA.      ADLs: independent with ADLs.  Drives.   Social Determinants of Health   Financial Resource Strain: Low Risk   . Difficulty of Paying Living Expenses: Not hard at all  Food Insecurity: No Food Insecurity  . Worried About Charity fundraiser in the Last Year: Never true  . Ran Out of Food in the Last Year: Never true  Transportation Needs: No Transportation Needs  . Lack of Transportation (Medical): No  . Lack of Transportation (Non-Medical): No  Physical Activity: Inactive  . Days of Exercise per Week: 0 days  . Minutes of Exercise per Session: 0 min  Stress: No Stress Concern Present  . Feeling of Stress : Not at all  Social Connections: Socially Isolated  . Frequency of Communication with Friends and Family: More than three times a week  . Frequency of Social Gatherings with Friends and Family: More than three times a week  . Attends Religious Services: Never  . Active Member of Clubs or Organizations: No  . Attends Archivist Meetings: Never  . Marital Status: Widowed  Intimate Partner Violence: Not At Risk  . Fear of Current or Ex-Partner: No  . Emotionally Abused: No  . Physically Abused: No  . Sexually Abused: No   Family Status  Relation Name Status  . Mother  Deceased at age 43       Lung cancer  . Father  Deceased at age 42       Massive Heart Attack  . Son #2 Alive  . Sister  Alive  . Son Joangel Vanosdol Deceased at age 59       Died from heart failure Sep 09, 2017   Family History  Problem Relation Age of Onset  . Hypertension Mother   . Cancer Mother        lung  . Heart attack Father   . Hypertension Father   . Hyperlipidemia Father   . Aneurysm Father 35       Brain  . Heart disease Father 57       AMI as cause of death  . Diabetes Son   . Healthy Sister    No Known Allergies  Patient Care Team: Ladesha Pacini, Dionne Bucy, MD as PCP - General (Family  Medicine) Pieter Partridge, DO as Consulting Physician (Neurology) [name], MD as Consulting Physician (  Urology) Elsie Saas, MD as Consulting Physician (Orthopedic Surgery) Anell Barr, OD as Consulting Physician (Optometry)   Medications: Outpatient Medications Prior to Visit  Medication Sig  . aspirin EC 81 MG tablet Take 81 mg by mouth daily.  . Blood Glucose Calibration (ACCU-CHEK COMPACT PLUS CONTROL) SOLN Test blood sugar daily. Dx code: E11.9  . blood glucose meter kit and supplies KIT Dispense based on patient and insurance preference. Check sugar once daily as directed. (FOR ICD-9 250.00, 250.01).  . Blood Glucose Monitoring Suppl (BLOOD GLUCOSE METER KIT AND SUPPLIES) KIT Test blood sugar daily. Dx code: E11.9  . clopidogrel (PLAVIX) 75 MG tablet Take 1 tablet (75 mg total) by mouth daily.  Marland Kitchen gabapentin (NEURONTIN) 300 MG capsule TAKE 1 CAPSULE(300 MG) BY MOUTH AT BEDTIME  . glucose blood test strip Test blood sugar daily. Dx Code: E11.9  . hydrochlorothiazide (HYDRODIURIL) 12.5 MG tablet Take 1 tablet (12.5 mg total) by mouth daily.  . Lancets MISC Test blood sugar daily. Dx code: E11.9  . lisinopril (ZESTRIL) 20 MG tablet Take 1 tablet (20 mg total) by mouth daily.  Marland Kitchen lovastatin (MEVACOR) 40 MG tablet Take 1 tablet (40 mg total) by mouth at bedtime.  . sildenafil (REVATIO) 20 MG tablet 2-5 tabs 1 hour prior to intercourse  . tamsulosin (FLOMAX) 0.4 MG CAPS capsule Take 1 capsule (0.4 mg total) by mouth daily after breakfast.  . [DISCONTINUED] diazepam (VALIUM) 5 MG tablet Take 1/2 to 1 po daily as needed with onset of panic attack   No facility-administered medications prior to visit.    Review of Systems  Constitutional: Negative.   HENT: Negative.   Eyes: Negative.   Respiratory: Negative.   Cardiovascular: Negative.   Gastrointestinal: Negative.   Endocrine: Negative.   Genitourinary: Negative.   Musculoskeletal: Negative.   Skin: Negative.     Allergic/Immunologic: Negative.   Neurological: Negative.   Hematological: Negative.   Psychiatric/Behavioral: Negative.       Objective    BP 136/62   Pulse (!) 47    Physical Exam   General: Well appearing male in NAD, pleasant conversation and affect HEENT: Hearing impairment, normal external ear canal, tympanic membranes intact and clear bilaterally.  Lungs: CTA Bilaterlally, normal WOB Cards: RRR, no murmurs rubs or gallops GI: Nondistended nontender MSK: Weakness of L  shoulder external rotation, positive open can test of L arm, difficulty abducting l shoulder. Tenderness to deep palpation over left suprascapular region. Extremities: Nontender subcutaneous nodules on fingers and lateral deformities of DIPJ of R index finger.   Last depression screening scores PHQ 2/9 Scores 11/06/2019 10/06/2019 07/17/2018  PHQ - 2 Score 0 0 0  PHQ- 9 Score 0 1 -   Last fall risk screening Fall Risk  11/06/2019  Falls in the past year? 0  Number falls in past yr: 0  Injury with Fall? 0  Risk for fall due to : -  Follow up -   Last Audit-C alcohol use screening Alcohol Use Disorder Test (AUDIT) 11/06/2019  1. How often do you have a drink containing alcohol? 3  2. How many drinks containing alcohol do you have on a typical day when you are drinking? 1  3. How often do you have six or more drinks on one occasion? 0  AUDIT-C Score 4  4. How often during the last year have you found that you were not able to stop drinking once you had started? 0  5. How often during the last  year have you failed to do what was normally expected from you because of drinking? 0  6. How often during the last year have you needed a first drink in the morning to get yourself going after a heavy drinking session? 0  7. How often during the last year have you had a feeling of guilt of remorse after drinking? 0  8. How often during the last year have you been unable to remember what happened the night before  because you had been drinking? 0  9. Have you or someone else been injured as a result of your drinking? 0  10. Has a relative or friend or a doctor or another health worker been concerned about your drinking or suggested you cut down? 0  Alcohol Use Disorder Identification Test Final Score (AUDIT) 4  Alcohol Brief Interventions/Follow-up Brief Advice;Continued Monitoring;Patient Refused   A score of 3 or more in women, and 4 or more in men indicates increased risk for alcohol abuse, EXCEPT if all of the points are from question 1   No results found for any visits on 11/06/19.  Assessment & Plan    Routine Health Maintenance and Physical Exam  Exercise Activities and Dietary recommendations Goals    . Reduce alcohol intake     Recommend to cut back on alcohol intake by half (3-4 beers on the weekend).        Immunization History  Administered Date(s) Administered  . DTaP 03/10/2011  . Fluad Quad(high Dose 65+) 11/06/2018, 10/06/2019  . Influenza Split 09/10/2010  . Influenza, High Dose Seasonal PF 01/18/2017, 11/02/2017  . Influenza,inj,Quad PF,6+ Mos 12/11/2012, 10/13/2013, 01/11/2015, 02/05/2016  . PFIZER SARS-COV-2 Vaccination 10/16/2019, 11/06/2019  . Pneumococcal Conjugate-13 10/13/2013  . Pneumococcal-Unspecified 03/10/2011    Health Maintenance  Topic Date Due  . OPHTHALMOLOGY EXAM  07/15/2015  . HEMOGLOBIN A1C  04/04/2020  . FOOT EXAM  11/05/2020  . TETANUS/TDAP  12/18/2020  . INFLUENZA VACCINE  Completed  . COVID-19 Vaccine  Completed  . PNA vac Low Risk Adult  Completed    Discussed health benefits of physical activity, and encouraged him to engage in regular exercise appropriate for his age and condition. . Problem List Items Addressed This Visit      Endocrine   Diabetes mellitus ([place])    Well controlled Associated with HTN and HLD UTD on vaccines Passed DFT today, scheduled for eye exam next month On ACEi and Statin No changes in treatment  course F/u 6 months        Nervous and Auditory   Bilateral hearing loss    Difficulty with masked conversation and lower frequencies Patient expressed interest in hearing aids Refer to audiology      Relevant Orders   Ambulatory referral to Audiology     Musculoskeletal and Integument   Rotator cuff disorder, left    6 day history of L shoulder pain Assessment: Weakness of external roation, mild tenderness to deep palpation and weak open can test is most consistent with a rotator cuff  Injury or strain of the trapezius muscle Rest, PRN NSAIDs, Icy hot and physical therapy. Surgery is not indicated at this time F/U 6 months unless pains becomes worse of debilatating      Relevant Orders   Ambulatory referral to Physical Therapy   Trapezius muscle spasm     Other   Chewing tobacco use    Understands the harms of chewing tobacco Had a short conversation about reducing amount Continue to follow  Overweight   RESOLVED: Shoulder pain, acute    Other Visit Diagnoses    Encounter for annual physical exam    -  Primary      Return in about 6 months (around 05/06/2020) for chronic disease f/u.     Note was initiated by Stark Klein, MS3  Patient seen along with MS3 student [facility]. I personally evaluated this patient along with the student, and verified all aspects of the history, physical exam, and medical decision making as documented by the student. I agree with the student's documentation and have made all necessary edits.  Galia Rahm, Dionne Bucy, MD, MPH [facility]

## 2019-11-06 NOTE — Assessment & Plan Note (Addendum)
Well controlled Associated with HTN and HLD UTD on vaccines Passed DFT today, scheduled for eye exam next month On ACEi and Statin No changes in treatment course F/u 6 months

## 2019-11-06 NOTE — Assessment & Plan Note (Signed)
Understands the harms of chewing tobacco Had a short conversation about reducing amount Continue to follow

## 2019-11-06 NOTE — Patient Instructions (Signed)
[name] , Thank you for taking time to come for your Medicare Wellness Visit. I appreciate your ongoing commitment to your health goals. Please review the following plan we discussed and let me know if I can assist you in the future.   Screening recommendations/referrals: Colonoscopy: No longer required.  Recommended yearly ophthalmology/optometry visit for glaucoma screening and checkup Recommended yearly dental visit for hygiene and checkup  Vaccinations: Influenza vaccine: Done 10/06/19 Pneumococcal vaccine: Completed series Tdap vaccine: Up to date, due 12/2020 Shingles vaccine: Shingrix discussed. Please contact your pharmacy for coverage information.     Advanced directives: Please bring a copy of your POA (Power of Attorney) and/or Living Will to your next appointment.   Conditions/risks identified: Recommend to decrease alcohol intake by half (3-4 drinks a week).  Next appointment: 10:00 AM today with Dr Brita Romp  Preventive Care 81 Years and Older, Male Preventive care refers to lifestyle choices and visits with your health care provider that can promote health and wellness. What does preventive care include?  A yearly physical exam. This is also called an annual well check.  Dental exams once or twice a year.  Routine eye exams. Ask your health care provider how often you should have your eyes checked.  Personal lifestyle choices, including:  Daily care of your teeth and gums.  Regular physical activity.  Eating a healthy diet.  Avoiding tobacco and drug use.  Limiting alcohol use.  Practicing safe sex.  Taking low doses of aspirin every day.  Taking vitamin and mineral supplements as recommended by your health care provider. What happens during an annual well check? The services and screenings done by your health care provider during your annual well check will depend on your age, overall health, lifestyle risk factors, and family history of  disease. Counseling  Your health care provider may ask you questions about your:  Alcohol use.  Tobacco use.  Drug use.  Emotional well-being.  Home and relationship well-being.  Sexual activity.  Eating habits.  History of falls.  Memory and ability to understand (cognition).  Work and work Statistician. Screening  You may have the following tests or measurements:  Height, weight, and BMI.  Blood pressure.  Lipid and cholesterol levels. These may be checked every 5 years, or more frequently if you are over 45 years old.  Skin check.  Lung cancer screening. You may have this screening every year starting at age 41 if you have a 30-pack-year history of smoking and currently smoke or have quit within the past 15 years.  Fecal occult blood test (FOBT) of the stool. You may have this test every year starting at age 67.  Flexible sigmoidoscopy or colonoscopy. You may have a sigmoidoscopy every 5 years or a colonoscopy every 10 years starting at age 82.  Prostate cancer screening. Recommendations will vary depending on your family history and other risks.  Hepatitis C blood test.  Hepatitis B blood test.  Sexually transmitted disease (STD) testing.  Diabetes screening. This is done by checking your blood sugar (glucose) after you have not eaten for a while (fasting). You may have this done every 1-3 years.  Abdominal aortic aneurysm (AAA) screening. You may need this if you are a current or former smoker.  Osteoporosis. You may be screened starting at age 50 if you are at high risk. Talk with your health care provider about your test results, treatment options, and if necessary, the need for more tests. Vaccines  Your health care provider  may recommend certain vaccines, such as:  Influenza vaccine. This is recommended every year.  Tetanus, diphtheria, and acellular pertussis (Tdap, Td) vaccine. You may need a Td booster every 10 years.  Zoster vaccine. You may  need this after age 39.  Pneumococcal 13-valent conjugate (PCV13) vaccine. One dose is recommended after age 56.  Pneumococcal polysaccharide (PPSV23) vaccine. One dose is recommended after age 45. Talk to your health care provider about which screenings and vaccines you need and how often you need them. This information is not intended to replace advice given to you by your health care provider. Make sure you discuss any questions you have with your health care provider. Document Released: 01/22/2015 Document Revised: 09/15/2015 Document Reviewed: 10/27/2014 Elsevier Interactive Patient Education  2017 [place] Prevention in the Home Falls can cause injuries. They can happen to people of all ages. There are many things you can do to make your home safe and to help prevent falls. What can I do on the outside of my home?  Regularly fix the edges of walkways and driveways and fix any cracks.  Remove anything that might make you trip as you walk through a door, such as a raised step or threshold.  Trim any bushes or trees on the path to your home.  Use bright outdoor lighting.  Clear any walking paths of anything that might make someone trip, such as rocks or tools.  Regularly check to see if handrails are loose or broken. Make sure that both sides of any steps have handrails.  Any raised decks and porches should have guardrails on the edges.  Have any leaves, snow, or ice cleared regularly.  Use sand or salt on walking paths during winter.  Clean up any spills in your garage right away. This includes oil or grease spills. What can I do in the bathroom?  Use night lights.  Install grab bars by the toilet and in the tub and shower. Do not use towel bars as grab bars.  Use non-skid mats or decals in the tub or shower.  If you need to sit down in the shower, use a plastic, non-slip stool.  Keep the floor dry. Clean up any water that spills on the floor as soon as it  happens.  Remove soap buildup in the tub or shower regularly.  Attach bath mats securely with double-sided non-slip rug tape.  Do not have throw rugs and other things on the floor that can make you trip. What can I do in the bedroom?  Use night lights.  Make sure that you have a light by your bed that is easy to reach.  Do not use any sheets or blankets that are too big for your bed. They should not hang down onto the floor.  Have a firm chair that has side arms. You can use this for support while you get dressed.  Do not have throw rugs and other things on the floor that can make you trip. What can I do in the kitchen?  Clean up any spills right away.  Avoid walking on wet floors.  Keep items that you use a lot in easy-to-reach places.  If you need to reach something above you, use a strong step stool that has a grab bar.  Keep electrical cords out of the way.  Do not use floor polish or wax that makes floors slippery. If you must use wax, use non-skid floor wax.  Do not have throw rugs  and other things on the floor that can make you trip. What can I do with my stairs?  Do not leave any items on the stairs.  Make sure that there are handrails on both sides of the stairs and use them. Fix handrails that are broken or loose. Make sure that handrails are as long as the stairways.  Check any carpeting to make sure that it is firmly attached to the stairs. Fix any carpet that is loose or worn.  Avoid having throw rugs at the top or bottom of the stairs. If you do have throw rugs, attach them to the floor with carpet tape.  Make sure that you have a light switch at the top of the stairs and the bottom of the stairs. If you do not have them, ask someone to add them for you. What else can I do to help prevent falls?  Wear shoes that:  Do not have high heels.  Have rubber bottoms.  Are comfortable and fit you well.  Are closed at the toe. Do not wear sandals.  If you  use a stepladder:  Make sure that it is fully opened. Do not climb a closed stepladder.  Make sure that both sides of the stepladder are locked into place.  Ask someone to hold it for you, if possible.  Clearly mark and make sure that you can see:  Any grab bars or handrails.  First and last steps.  Where the edge of each step is.  Use tools that help you move around (mobility aids) if they are needed. These include:  Canes.  Walkers.  Scooters.  Crutches.  Turn on the lights when you go into a dark area. Replace any light bulbs as soon as they burn out.  Set up your furniture so you have a clear path. Avoid moving your furniture around.  If any of your floors are uneven, fix them.  If there are any pets around you, be aware of where they are.  Review your medicines with your doctor. Some medicines can make you feel dizzy. This can increase your chance of falling. Ask your doctor what other things that you can do to help prevent falls. This information is not intended to replace advice given to you by your health care provider. Make sure you discuss any questions you have with your health care provider. Document Released: 10/22/2008 Document Revised: 06/03/2015 Document Reviewed: 01/30/2014 Elsevier Interactive Patient Education  2017 [employer].

## 2019-11-11 ENCOUNTER — Other Ambulatory Visit: Payer: Self-pay | Admitting: Physician Assistant

## 2019-11-13 ENCOUNTER — Ambulatory Visit: Payer: Self-pay

## 2019-12-05 ENCOUNTER — Other Ambulatory Visit: Payer: Self-pay | Admitting: Urology

## 2019-12-10 ENCOUNTER — Ambulatory Visit: Payer: Medicare PPO | Attending: Family Medicine | Admitting: Physical Therapy

## 2019-12-10 ENCOUNTER — Other Ambulatory Visit: Payer: Self-pay

## 2019-12-10 ENCOUNTER — Encounter: Payer: Self-pay | Admitting: Physical Therapy

## 2019-12-10 DIAGNOSIS — R293 Abnormal posture: Secondary | ICD-10-CM | POA: Diagnosis not present

## 2019-12-10 DIAGNOSIS — M25512 Pain in left shoulder: Secondary | ICD-10-CM | POA: Insufficient documentation

## 2019-12-10 DIAGNOSIS — M6281 Muscle weakness (generalized): Secondary | ICD-10-CM | POA: Insufficient documentation

## 2019-12-10 NOTE — Therapy (Signed)
[facility] [address] [place], [address] Phone: [phone]   Fax:  [phone]  Physical Therapy Evaluation  Patient Details  Name: Todd Diaz MRN: [id number] Date of Birth: 06-22-1940 No data recorded  Encounter Date: 12/10/2019   PT End of Session - 12/10/19 1457    Visit Number 1    Number of Visits 17    Date for PT Re-Evaluation 12/31/19    Authorization Type 1    Authorization Time Period 10    PT Start Time 0145    PT Stop Time 0235    PT Time Calculation (min) 50 min    Activity Tolerance Patient tolerated treatment well;No increased pain    Behavior During Therapy WFL for tasks assessed/performed           Past Medical History:  Diagnosis Date  . Arthritis    Knees  . Avascular necrosis of medial femoral condyle ([place])   . Chronic prostatitis   . Diabetes mellitus ([place])    controlled with medication  . Erectile dysfunction   . Hearing loss   . Hyperlipidemia   . Hypertension    controlled with medication  . Hypertrophy of prostate without urinary obstruction and other lower urinary tract symptoms (LUTS)   . Infection of urinary tract    ASSESSMENT:Recurrent MRSA UTI F/B Dr. Clayborn Bigness and Dr. Jacqlyn Larsen  . TIA (transient ischemic attack) 02/23/2014   R sided weakness and numbness; duration 1 hour; evaluated [facility].  . Tobacco use disorder     Past Surgical History:  Procedure Laterality Date  . Admission  03/09/2012   [facility]; 24 hour admission; LE numbness/tingling.  CT head, MRI brain, carotid dopplers, 2D-echo.  . AMPUTATION  2009   L fifth finger surgery for near amputation. [place].  [facility] BLADDER STONE REMOVAL    . EYE SURGERY  08/09/2013   B cataract surgery; [facility]  . HERNIA REPAIR  04/09/2013   3 repaired; [facility].  . JOINT REPLACEMENT    . PARTIAL KNEE ARTHROPLASTY  07/24/2011   Procedure: UNICOMPARTMENTAL KNEE;  Surgeon: Lorn Junes, MD;  Location: [facility];  Service: Orthopedics;   Laterality: Left;  . PROSTATE SURGERY    . RETINAL TEAR REPAIR CRYOTHERAPY  08/09/2013   [facility]    There were no vitals filed for this visit.     Subjective Assessment - 12/10/19 1449    Pertinent History Patient is a 79 year old male presenting with L shoulder pain that he describes as "achy" at superior aspect of shoulder and scapula. Has had pain for a few weeks and noticed it the day after he was lifting a heavy carpet for work. Denies immediate pain/popping etc, was just sore the days following, without subsiding. Pt reports worst pain 8/10, best 0/10 exacerbated by heavy lifting, overhead reaching, pushing/pulling. Denies numbness, tingling, or redicular symptoms. 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 Pt likes to golf but has been unable to do so since hurting his L shoulder. Pt is unable to sleep on L side due pain. Pt lives alone and is independent with ADLs.    Limitations Lifting    How long can you sit comfortably? 10 min    How long can you stand comfortably? NA    How long can you walk comfortably? NA    Patient Stated Goals decrease pain    Currently in Pain? Yes    Pain Score  7     Pain Location Neck    Pain Orientation Left;Posterior    Pain Descriptors / Indicators Aching;Dull    Pain Type Acute pain    Pain Onset More than a month ago    Pain Frequency Intermittent    Aggravating Factors  lifting heavy objects overhead    Pain Relieving Factors icy hot, heat pad              OBJECTIVE    Cervical Screen AROM: Ext/flex WFL, pain with PROM ext   Rotation R: 40 pain L: 60   Lateral bending R:8  L: 15  Elbow Screen Elbow AROM/PROM: WFL no pain   Palpation TTP and painful on L supraspinatus (concordant pain sign), TTP on L UT Hypomobile upper t- spine/lower cspine   Posture Upper cross syndrome with increased thoracic kyphosis, tight pecs, protracted scapulae, and forward head  Scapular dyskinesis with overhead  mobility    Strength R/L Shoulder flexion 2+/2+ Shoulder abduction 2+/2+  Shoulder external rotation 5/3+ Shoulder internal rotation 5/5  Shoulder horizontal abduction 5/2- Shoulder horizontal adduction 5/4+ Shoulder shrug (upper traps) 5   Limited ROM on R/L  5/5 within available range for flexion and abduction   Elbow flexion 5/5  Elbow extension 5/5   Wrist Extension 5/5 Wrist Flexion 5/5  Finger adduction     Prone ** check next visit  Y lower trap T middle trap    AROM (measure w/goni) R/L Shoulder flexion 155/145 Shoulder abduction 105/97 Shoulder external rotation 64/48 (C7 w/LUE) Shoulder internal rotation T12 w/LUE Shoulder extension WNL    PROM R/L Shoulder flexion 163/157 Shoulder abduction 110/105 Shoulder external rotation 70/58 Shoulder internal rotation 71/68 Shoulder extension   End feels: firm end feel, increased capsular tightness restricting full ROM   SPECIAL TESTS   Rotator Cuff  Drop Arm Test: negative Painful Arc (Pain from 60 to 120 degrees scaption): Positive; unable to surpass 105d d/t pain/lack of mobility  Infraspinatus Muscle Test: negative   Subacromial Impingement Hawkins-Kennedy: negative Neer: negative Painful Arc: negative Empty Can: positive  Belly test: negative External Rotation Resistance: Negative; positive for weakness, no pain   Objective measurements completed on examination: See above findings.   Therex PT reviewed the following HEP with patient with patient able to demonstrate a set of the following with min cuing for correction needed. PT educated patient on parameters of therex (how/when to inc/decrease intensity, frequency, rep/set range, stretch hold time, and purpose of therex) with verbalized understanding.  HEP Scapular squeezes 2x12 with 3 sec holds Isometric ER doorway with towel 1x10, 5 sec hold Doorway pec stretch 2x30sec        PT Short Term Goals - 12/10/19 1639      PT SHORT TERM  GOAL #1   Title Patient will independent with HEP in order to improve shoulder mobility and strength to be able to improve function at work and home    Baseline 12/10/19 HEP given    Time 4    Period Weeks    Status New    Target Date 01/07/20             PT Long Term Goals - 12/10/19 1649      Additional Long Term Goals   Additional Long Term Goals --      PT LONG TERM GOAL #7   Title --    Baseline --    Time --    Period --    Status --  Target Date --                  Plan - 12/10/19 1505    Clinical Impression Statement Pt is a 79 year old male presenting with L shoulder pain consistent with supraspinatus muscle strain and upper crossed syndrome.Patient has impairments in L shoulder ROM, decreased scapulohumeral rhythm, postural deficits, decreased neuromuscular control, thoracic hypomobility, upper crossed pattern of muscle tightness, and muscle weakness (tension of suboccipitals/pec major/minor; weakness of scapular retractors and deep cerical flexors). Pt has limitations with lying flat due to protracted shoulder that limit his ability to sleep without discomfort, pushing/pulling, and lifting heavy objects overhead that limit full participation at work. Pt will benefit from skilled PT to address mentioned deficits to be able to sleep on L side, lift objects overhead, and return to playing golf. Pt reports 2/10 pain at the end of intial evaluation visit following exercises given for HEP.    Personal Factors and Comorbidities Age;Sex;Education;Comorbidity 1;Comorbidity 2    Comorbidities HTN, DM, arthiritis    Examination-Activity Limitations Lift;Reach Overhead;Sleep    Examination-Participation Restrictions Occupation    Stability/Clinical Decision Making Evolving/Moderate complexity    Clinical Decision Making Moderate    Rehab Potential Good    PT Frequency 2x / week    PT Duration 8 weeks    PT Treatment/Interventions ADLs/Self Care Home  Management;Cryotherapy;Electrical Stimulation;Moist Heat;Traction;Balance training;Therapeutic exercise;Therapeutic activities;Functional mobility training;Stair training;Gait training;Neuromuscular re-education;Ultrasound;Patient/family education;Energy conservation;Dry needling;Manual techniques;Joint Manipulations;Spinal Manipulations;Passive range of motion    PT Next Visit Plan MMT prone y and t, review HEP    PT Home Exercise Plan scapular retractions, pec stretch, isometric ER    Consulted and Agree with Plan of Care Patient           Patient will benefit from skilled therapeutic intervention in order to improve the following deficits and impairments:  Hypomobility, Decreased activity tolerance, Pain, Improper body mechanics, Postural dysfunction, Impaired flexibility, Decreased mobility, Decreased range of motion, Decreased strength, Decreased coordination  Visit Diagnosis: Acute pain of left shoulder  Abnormal posture  Muscle weakness (generalized)     Problem List Patient Active Problem List   Diagnosis Date Noted  . Overweight 11/06/2019  . Bilateral hearing loss 11/06/2019  . Rotator cuff disorder, left 11/06/2019  . Trapezius muscle spasm 11/06/2019  . Alcohol use disorder, mild, abuse 10/06/2019  . Senile purpura ([place]) 06/18/2019  . Erectile dysfunction due to arterial insufficiency 12/02/2018  . Toenail deformity 11/06/2018  . Bilateral cataracts 11/21/2017  . Chewing tobacco use 11/21/2017  . Nephrogenic adenoma of bladder 10/07/2015  . Starting and stopping of urinary stream during micturition 10/07/2015  . Benign localized hyperplasia of prostate with urinary obstruction 08/11/2015  . Incomplete bladder emptying 08/11/2015  . Phimosis 08/11/2015  . Degenerative disc disease, lumbar 01/12/2015  . Diabetes mellitus ([place]) 01/12/2015  . History of TIA (transient ischemic attack) 01/12/2015  . Right leg numbness 08/18/2014  . Bilateral inguinal hernia (BIH) s/p  lap reapir w mesh 03/03/2013 02/26/2013  . Panic attack 05/20/2012  . Essential hypertension, benign 12/26/2011  . Impotence 12/26/2011  . Hyperlipidemia associated with type 2 diabetes mellitus ([place]) 12/26/2011  . Avascular necrosis of medial femoral condyle [facility])     [name] DPT [facility], SPT [name] 12/11/2019, 1:57 PM  [facility] [address] [place], [address] Phone: [phone]   Fax:  [phone]  Name: ANTAVION BARTOSZEK MRN: [id number] Date of Birth:  12/27/1940  

## 2019-12-11 ENCOUNTER — Other Ambulatory Visit: Payer: Self-pay | Admitting: Urology

## 2019-12-11 DIAGNOSIS — N138 Other obstructive and reflux uropathy: Secondary | ICD-10-CM

## 2019-12-16 ENCOUNTER — Other Ambulatory Visit: Payer: Self-pay

## 2019-12-16 ENCOUNTER — Encounter: Payer: Self-pay | Admitting: Physical Therapy

## 2019-12-16 ENCOUNTER — Ambulatory Visit: Payer: Medicare PPO | Admitting: Physical Therapy

## 2019-12-16 DIAGNOSIS — M25512 Pain in left shoulder: Secondary | ICD-10-CM

## 2019-12-16 DIAGNOSIS — M6281 Muscle weakness (generalized): Secondary | ICD-10-CM

## 2019-12-16 DIAGNOSIS — R293 Abnormal posture: Secondary | ICD-10-CM

## 2019-12-16 NOTE — Therapy (Signed)
[facility] [address], [place], [address] Phone: [phone]   Fax:  [phone]  Physical Therapy Treatment  Patient Details  Name: Todd Diaz MRN: [id number] Date of Birth: 1940-12-07 No data recorded  Encounter Date: 12/16/2019   PT End of Session - 12/16/19 1650    Visit Number 2    Number of Visits 17    Date for PT Re-Evaluation 12/31/19    Authorization Type 2    Authorization Time Period 10    PT Start Time 0230    PT Stop Time 0310    PT Time Calculation (min) 40 min    Activity Tolerance Patient tolerated treatment well;No increased pain    Behavior During Therapy WFL for tasks assessed/performed           Past Medical History:  Diagnosis Date  . Arthritis    Knees  . Avascular necrosis of medial femoral condyle ([place])   . Chronic prostatitis   . Diabetes mellitus ([place])    controlled with medication  . Erectile dysfunction   . Hearing loss   . Hyperlipidemia   . Hypertension    controlled with medication  . Hypertrophy of prostate without urinary obstruction and other lower urinary tract symptoms (LUTS)   . Infection of urinary tract    ASSESSMENT:Recurrent MRSA UTI F/B Dr. Clayborn Bigness and Dr. Jacqlyn Larsen  . TIA (transient ischemic attack) 02/23/2014   R sided weakness and numbness; duration 1 hour; evaluated [facility].  . Tobacco use disorder     Past Surgical History:  Procedure Laterality Date  . Admission  03/09/2012   [facility]; 24 hour admission; LE numbness/tingling.  CT head, MRI brain, carotid dopplers, 2D-echo.  . AMPUTATION  2009   L fifth finger surgery for near amputation. [facility].  Marland Kitchen BLADDER STONE REMOVAL    . EYE SURGERY  08/09/2013   B cataract surgery; [facility]  . HERNIA REPAIR  04/09/2013   3 repaired; [facility].  . JOINT REPLACEMENT    . PARTIAL KNEE ARTHROPLASTY  07/24/2011   Procedure: UNICOMPARTMENTAL KNEE;  Surgeon: Lorn Junes, MD;  Location: [facility];  Service: Orthopedics;   Laterality: Left;  . PROSTATE SURGERY    . RETINAL TEAR REPAIR CRYOTHERAPY  08/09/2013   [facility]    There were no vitals filed for this visit.   Subjective Assessment - 12/16/19 1432    Subjective Patient reports very minimal pain in L shoulder and rates it a 2/10. Pt states he is able to work without having too much pain and the exercises have been helping.    Pertinent History Patient is a 79 year old male presenting with L shoulder pain that he describes as "achy" at superior aspect of shoulder and scapula. Has had pain for a few weeks and noticed it the day after he was lifting a heavy carpet for work. Denies immediate pain/popping etc, was just sore the days following, without subsiding. Pt reports worst pain 8/10, best 0/10 exacerbated by heavy lifting, overhead reaching, pushing/pulling. Denies numbness, tingling, or redicular symptoms. 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 Pt likes to golf but has been unable to do so since hurting his L shoulder. Pt is unable to sleep on L side due pain. Pt lives alone and is independent with ADLs.    Limitations Lifting    How long can you sit comfortably? 10 min    How long can you  stand comfortably? NA    How long can you walk comfortably? NA    Patient Stated Goals decrease pain    Currently in Pain? Yes    Pain Score 2     Pain Location Neck    Pain Orientation Left;Posterior    Pain Descriptors / Indicators Aching;Dull    Pain Type Acute pain    Pain Onset More than a month ago          Therex  Warm up on arm bike L1 37mins L2 31mins   AAROM with wall walks and 3sec holds at the top  1x10 with 5sec holds flexion and abduction   Prone T 2x10 with cues to squeeze shoulder blades together and keep arms at 90d   Pec stretch 2x30sec  ER ISO 2x12 5sec holds  Scapular retractions with GTB 2x12 with cues to squeeze shoulder blades together and elbow by side   Seated thoracic extension over half  foam roll 2x12   Unable to do prone Y, slight movement in standing bilaterally 2/2                         PT Education - 12/16/19 1650    Education provided Yes    Education Details therex form/technique    Person(s) Educated Patient    Methods Explanation;Demonstration;Verbal cues    Comprehension Verbalized understanding;Returned demonstration;Verbal cues required            PT Short Term Goals - 12/10/19 1639      PT SHORT TERM GOAL #1   Title Patient will independent with HEP in order to improve shoulder mobility and strength to be able to improve function at work and home    Baseline 12/10/19 HEP given    Time 4    Period Weeks    Status New    Target Date 01/07/20             PT Long Term Goals - 12/10/19 1649      Additional Long Term Goals   Additional Long Term Goals --      PT LONG TERM GOAL #7   Title --    Baseline --    Time --    Period --    Status --    Target Date --                 Plan - 12/16/19 1807    Clinical Impression Statement Pt returns to first PT treatment and reports little to no pain in L neck and shoulder and states he felt significantly better following the eval and completing HEP. Upon reassessment of posture, patient has increase lateral shift that presents as a right sided curve in spine. Pt also has increased R shoulder hiking and tightness in bilat pec minor and major. PT session focused on increasing motor control of scapulohumeral rhythm and L shoulder ROM. Pt is able to complete all therex with heavy cues for correct form and technique and no increase in pain or difficulty. PT will progress as able,    Personal Factors and Comorbidities Age;Sex;Education;Comorbidity 1;Comorbidity 2    Comorbidities HTN, DM, arthiritis    Examination-Activity Limitations Lift;Reach Overhead;Sleep    Examination-Participation Restrictions Occupation    Stability/Clinical Decision Making Evolving/Moderate complexity     Clinical Decision Making Moderate    Rehab Potential Good    Clinical Impairments Affecting Rehab Potential positive: motivated, response to exercise; negative: co-morbidities; patient's clinical presentation  is stable as he is responding to exercise and symptoms easily resolve.    PT Frequency 2x / week    PT Duration 8 weeks    PT Treatment/Interventions ADLs/Self Care Home Management;Cryotherapy;Electrical Stimulation;Moist Heat;Traction;Balance training;Therapeutic exercise;Therapeutic activities;Functional mobility training;Stair training;Gait training;Neuromuscular re-education;Ultrasound;Patient/family education;Energy conservation;Dry needling;Manual techniques;Joint Manipulations;Spinal Manipulations;Passive range of motion    PT Next Visit Plan MMT prone y and t, review HEP    PT Home Exercise Plan scapular retractions, pec stretch, isometric ER    Consulted and Agree with Plan of Care Patient           Patient will benefit from skilled therapeutic intervention in order to improve the following deficits and impairments:  Hypomobility, Decreased activity tolerance, Pain, Improper body mechanics, Postural dysfunction, Impaired flexibility, Decreased mobility, Decreased range of motion, Decreased strength, Decreased coordination  Visit Diagnosis: Acute pain of left shoulder  Abnormal posture  Muscle weakness (generalized)     Problem List Patient Active Problem List   Diagnosis Date Noted  . Overweight 11/06/2019  . Bilateral hearing loss 11/06/2019  . Rotator cuff disorder, left 11/06/2019  . Trapezius muscle spasm 11/06/2019  . Alcohol use disorder, mild, abuse 10/06/2019  . Senile purpura ([place]) 06/18/2019  . Erectile dysfunction due to arterial insufficiency 12/02/2018  . Toenail deformity 11/06/2018  . Bilateral cataracts 11/21/2017  . Chewing tobacco use 11/21/2017  . Nephrogenic adenoma of bladder 10/07/2015  . Starting and stopping of urinary stream during  micturition 10/07/2015  . Benign localized hyperplasia of prostate with urinary obstruction 08/11/2015  . Incomplete bladder emptying 08/11/2015  . Phimosis 08/11/2015  . Degenerative disc disease, lumbar 01/12/2015  . Diabetes mellitus ([place]) 01/12/2015  . History of TIA (transient ischemic attack) 01/12/2015  . Right leg numbness 08/18/2014  . Bilateral inguinal hernia (BIH) s/p lap reapir w mesh 03/03/2013 02/26/2013  . Panic attack 05/20/2012  . Essential hypertension, benign 12/26/2011  . Impotence 12/26/2011  . Hyperlipidemia associated with type 2 diabetes mellitus ([place]) 12/26/2011  . Avascular necrosis of medial femoral condyle [facility])      [name] DPT [facility], SPT [name] 12/17/2019, 11:17 AM  [facility] [address], [place], [address] Phone: [phone]   Fax:  [phone]  Name: Todd Diaz MRN: [id number] Date of Birth: 06/08/1940

## 2019-12-17 ENCOUNTER — Other Ambulatory Visit: Payer: Self-pay | Admitting: Family Medicine

## 2019-12-17 DIAGNOSIS — I10 Essential (primary) hypertension: Secondary | ICD-10-CM

## 2019-12-17 MED ORDER — HYDROCHLOROTHIAZIDE 12.5 MG PO TABS: 12.5000 mg | ORAL_TABLET | Freq: Every day | ORAL | 3 refills | Status: DC

## 2019-12-17 NOTE — Telephone Encounter (Signed)
Verified with patient, he is taking HCTZ 12.5mg .

## 2019-12-17 NOTE — Telephone Encounter (Signed)
[facility] faxed refill request for the following medications:  2nd request: hydrochlorothiazide (HYDRODIURIL)  (25mg  tablets on fax)   Please advise. Thanks, [employer]

## 2019-12-18 ENCOUNTER — Encounter: Payer: Self-pay | Admitting: Urology

## 2019-12-18 ENCOUNTER — Ambulatory Visit: Payer: Medicare PPO | Admitting: Physical Therapy

## 2019-12-18 ENCOUNTER — Other Ambulatory Visit: Payer: Self-pay

## 2019-12-18 ENCOUNTER — Ambulatory Visit: Payer: Medicare PPO | Admitting: Urology

## 2019-12-18 VITALS — BP 159/92 | HR 101 | Ht 73.0 in | Wt 180.0 lb

## 2019-12-18 DIAGNOSIS — R339 Retention of urine, unspecified: Secondary | ICD-10-CM

## 2019-12-18 DIAGNOSIS — N5201 Erectile dysfunction due to arterial insufficiency: Secondary | ICD-10-CM | POA: Diagnosis not present

## 2019-12-18 DIAGNOSIS — N138 Other obstructive and reflux uropathy: Secondary | ICD-10-CM

## 2019-12-18 DIAGNOSIS — N401 Enlarged prostate with lower urinary tract symptoms: Secondary | ICD-10-CM

## 2019-12-18 LAB — BLADDER SCAN AMB NON-IMAGING: Scan Result: 273

## 2019-12-18 MED ORDER — TAMSULOSIN HCL 0.4 MG PO CAPS: ORAL_CAPSULE | ORAL | 3 refills | Status: DC

## 2019-12-18 MED ORDER — SILDENAFIL CITRATE 20 MG PO TABS
ORAL_TABLET | ORAL | 3 refills | Status: DC
Start: 2019-12-18 — End: 2020-05-24

## 2019-12-18 NOTE — Progress Notes (Signed)
12/18/2019 2:13 PM   Todd Diaz 14-Mar-1940 [id number]  Referring provider: Virginia Crews, MD [address] [place],  [place] [address]  Chief Complaint  Patient presents with  . Benign Prostatic Hypertrophy    Urologic history: 1.BPH with lower urinary tract symptoms -Prior TURP several years ago -On tamsulosin with stableLUTS -Incomplete emptying with PVRs ranging from 130-415; typically in 200 range  2.History of bladder calculus  3.Erectile dysfunction   HPI: 79 y.o. male presents for annual follow-up.   Denies any problems since last years visit  Stable LUTS on tamsulosin  Denies dysuria, gross hematuria  Denies flank, abdominal or pelvic pain  Was given trial of sildenafil last year which he states has been effective at a 60 mg dose   PMH: Past Medical History:  Diagnosis Date  . Arthritis    Knees  . Avascular necrosis of medial femoral condyle ([place])   . Chronic prostatitis   . Diabetes mellitus ([place])    controlled with medication  . Erectile dysfunction   . Hearing loss   . Hyperlipidemia   . Hypertension    controlled with medication  . Hypertrophy of prostate without urinary obstruction and other lower urinary tract symptoms (LUTS)   . Infection of urinary tract    ASSESSMENT:Recurrent MRSA UTI F/B Dr. Clayborn Bigness and Dr. Jacqlyn Larsen  . TIA (transient ischemic attack) 02/23/2014   R sided weakness and numbness; duration 1 hour; evaluated [facility].  . Tobacco use disorder     Surgical History: Past Surgical History:  Procedure Laterality Date  . Admission  03/09/2012   [facility]; 24 hour admission; LE numbness/tingling.  CT head, MRI brain, carotid dopplers, 2D-echo.  . AMPUTATION  2009   L fifth finger surgery for near amputation. [facility].  Marland Kitchen BLADDER STONE REMOVAL    . EYE SURGERY  08/09/2013   B cataract surgery; [facility]  . HERNIA REPAIR  04/09/2013   3 repaired; [facility].  . JOINT REPLACEMENT    . PARTIAL KNEE ARTHROPLASTY   07/24/2011   Procedure: UNICOMPARTMENTAL KNEE;  Surgeon: Lorn Junes, MD;  Location: [facility];  Service: Orthopedics;  Laterality: Left;  . PROSTATE SURGERY    . RETINAL TEAR REPAIR CRYOTHERAPY  08/09/2013   Todd Diaz    Home Medications:  Allergies as of 12/18/2019   No Known Allergies     Medication List       Accurate as of December 18, 2019  2:13 PM. If you have any questions, ask your nurse or doctor.        Accu-Chek Compact Plus Control Soln Test blood sugar daily. Dx code: E11.9   aspirin EC 81 MG tablet Take 81 mg by mouth daily.   blood glucose meter kit and supplies Kit Test blood sugar daily. Dx code: E11.9   blood glucose meter kit and supplies Kit Dispense based on patient and insurance preference. Check sugar once daily as directed. (FOR ICD-9 250.00, 250.01).   clopidogrel 75 MG tablet Commonly known as: PLAVIX Take 1 tablet (75 mg total) by mouth daily.   gabapentin 300 MG capsule Commonly known as: NEURONTIN TAKE 1 CAPSULE(300 MG) BY MOUTH AT BEDTIME   glucose blood test strip Test blood sugar daily. Dx Code: E11.9   hydrochlorothiazide 12.5 MG tablet Commonly known as: HYDRODIURIL Take 1 tablet (12.5 mg total) by mouth daily.   Lancets Misc Test blood sugar daily. Dx code: E11.9   lisinopril 20 MG tablet Commonly known as: ZESTRIL Take 1 tablet (  20 mg total) by mouth daily.   lovastatin 40 MG tablet Commonly known as: MEVACOR Take 1 tablet (40 mg total) by mouth at bedtime.   sildenafil 20 MG tablet Commonly known as: REVATIO TAKE 2 TO 5 TABLETS BY MOUTH 1 HOUR BEFORE SEXUAL ACTIVITY   tamsulosin 0.4 MG Caps capsule Commonly known as: FLOMAX TAKE 1 CAPSULE(0.4 MG) BY MOUTH DAILY AFTER BREAKFAST       Allergies: No Known Allergies  Family History: Family History  Problem Relation Age of Onset  . Hypertension Mother   . Cancer Mother        lung  . Heart attack Father   . Hypertension Father   . Hyperlipidemia Father   .  Aneurysm Father 56       Brain  . Heart disease Father 76       AMI as cause of death  . Diabetes Son   . Healthy Sister     Social History:  reports that he quit smoking about 33 years ago. His smoking use included cigarettes. He has a 10.00 pack-year smoking history. His smokeless tobacco use includes chew. He reports current alcohol use of about 8.0 - 10.0 standard drinks of alcohol per week. He reports that he does not use drugs.   Physical Exam: BP (!) 159/92   Pulse (!) 101   Ht _0  (1.854 m)   Wt 180 lb (81.6 kg)   BMI 23.75 kg/m   Constitutional:  Alert and oriented, No acute distress. HEENT: [place] AT, moist mucus membranes.  Trachea midline, no masses. Cardiovascular: No clubbing, cyanosis, or edema. Respiratory: Normal respiratory effort, no increased work of breathing. Skin: No rashes, bruises or suspicious lesions. Neurologic: Grossly intact, no focal deficits, moving all 4 extremities. Psychiatric: Normal mood and affect.   Assessment & Plan:    1. Benign localized hyperplasia of prostate with incomplete bladder emptying  Bladder scan PVR stable 273 mL  Tamsulosin refilled  Continue annual follow-up  2.  Erectile dysfunction  Sildenafil refilled   [name], MD  [facility] [address] [place], [place] [address] [phone]

## 2019-12-19 LAB — MICROSCOPIC EXAMINATION: Bacteria, UA: NONE SEEN

## 2019-12-19 LAB — URINALYSIS, COMPLETE
Bilirubin, UA: NEGATIVE
Glucose, UA: NEGATIVE
Ketones, UA: NEGATIVE
Leukocytes,UA: NEGATIVE
Nitrite, UA: NEGATIVE
Protein,UA: NEGATIVE
RBC, UA: NEGATIVE
Specific Gravity, UA: 1.02 (ref 1.005–1.030)
Urobilinogen, Ur: 0.2 mg/dL (ref 0.2–1.0)
pH, UA: 7 (ref 5.0–7.5)

## 2019-12-23 ENCOUNTER — Ambulatory Visit: Payer: Medicare PPO | Admitting: Physical Therapy

## 2019-12-23 ENCOUNTER — Other Ambulatory Visit: Payer: Self-pay

## 2019-12-23 ENCOUNTER — Encounter: Payer: Self-pay | Admitting: Physical Therapy

## 2019-12-23 DIAGNOSIS — M6281 Muscle weakness (generalized): Secondary | ICD-10-CM

## 2019-12-23 DIAGNOSIS — R293 Abnormal posture: Secondary | ICD-10-CM | POA: Diagnosis not present

## 2019-12-23 DIAGNOSIS — M25512 Pain in left shoulder: Secondary | ICD-10-CM

## 2019-12-23 NOTE — Therapy (Signed)
[facility] [address], [place], [address] Phone: [phone]   Fax:  [phone]  Physical Therapy Treatment  Patient Details  Name: Todd Diaz MRN: [id number] Date of Birth: 1940-04-08 No data recorded  Encounter Date: 12/23/2019    Past Medical History:  Diagnosis Date  . Arthritis    Knees  . Avascular necrosis of medial femoral condyle ([place])   . Chronic prostatitis   . Diabetes mellitus ([place])    controlled with medication  . Erectile dysfunction   . Hearing loss   . Hyperlipidemia   . Hypertension    controlled with medication  . Hypertrophy of prostate without urinary obstruction and other lower urinary tract symptoms (LUTS)   . Infection of urinary tract    ASSESSMENT:Recurrent MRSA UTI F/B Dr. Clayborn Bigness and Dr. Jacqlyn Larsen  . TIA (transient ischemic attack) 02/23/2014   R sided weakness and numbness; duration 1 hour; evaluated [facility].  . Tobacco use disorder     Past Surgical History:  Procedure Laterality Date  . Admission  03/09/2012   [facility]; 24 hour admission; LE numbness/tingling.  CT head, MRI brain, carotid dopplers, 2D-echo.  . AMPUTATION  2009   L fifth finger surgery for near amputation. [facility].  Marland Kitchen BLADDER STONE REMOVAL    . EYE SURGERY  08/09/2013   B cataract surgery; [facility]  . HERNIA REPAIR  04/09/2013   3 repaired; [facility].  . JOINT REPLACEMENT    . PARTIAL KNEE ARTHROPLASTY  07/24/2011   Procedure: UNICOMPARTMENTAL KNEE;  Surgeon: Lorn Junes, MD;  Location: [facility];  Service: Orthopedics;  Laterality: Left;  . PROSTATE SURGERY    . RETINAL TEAR REPAIR CRYOTHERAPY  08/09/2013   [facility]    There were no vitals filed for this visit.    Therex  Nustep UE 12 seat 12 for gentle protraction/retraction strengthening 93mins L4  AAROM dowel flex sitting with 1/2 foam at tspine for increased thoracic ext with BUE flex x15 2-3sec hold  Seated scaption 1# DB 2x 10with heavy cuing  needed for scapulohumeral rhythm with good carry over   Seated abd 1# DB 2x 10 with cuing to "feel shoulder blad against wall with decent carry over  Seated band pull aparts GTB 2x 10 with max cuing for scapular retraction with decent carry over  Standing ER GTB 3x 10 with cuing for proper technique with eccentric control with good carry over  Seated prone row 20# x10; 25# x10; 35# x10 with cuing for posture with good carry over  Pec stretch x30sec                               PT Short Term Goals - 12/10/19 1639      PT SHORT TERM GOAL #1   Title Patient will independent with HEP in order to improve shoulder mobility and strength to be able to improve function at work and home    Baseline 12/10/19 HEP given    Time 4    Period Weeks    Status New    Target Date 01/07/20             PT Long Term Goals - 12/10/19 1649      Additional Long Term Goals   Additional Long Term Goals --      PT LONG TERM GOAL #7   Title --    Baseline --  Time --    Period --    Status --    Target Date --                  Patient will benefit from skilled therapeutic intervention in order to improve the following deficits and impairments:     Visit Diagnosis: No diagnosis found.     Problem List Patient Active Problem List   Diagnosis Date Noted  . Overweight 11/06/2019  . Bilateral hearing loss 11/06/2019  . Rotator cuff disorder, left 11/06/2019  . Trapezius muscle spasm 11/06/2019  . Alcohol use disorder, mild, abuse 10/06/2019  . Senile purpura ([place]) 06/18/2019  . Erectile dysfunction due to arterial insufficiency 12/02/2018  . Toenail deformity 11/06/2018  . Bilateral cataracts 11/21/2017  . Chewing tobacco use 11/21/2017  . Nephrogenic adenoma of bladder 10/07/2015  . Starting and stopping of urinary stream during micturition 10/07/2015  . Benign localized hyperplasia of prostate with urinary obstruction 08/11/2015  .  Incomplete bladder emptying 08/11/2015  . Phimosis 08/11/2015  . Degenerative disc disease, lumbar 01/12/2015  . Diabetes mellitus ([place]) 01/12/2015  . History of TIA (transient ischemic attack) 01/12/2015  . Right leg numbness 08/18/2014  . Bilateral inguinal hernia (BIH) s/p lap reapir w mesh 03/03/2013 02/26/2013  . Panic attack 05/20/2012  . Essential hypertension, benign 12/26/2011  . Impotence 12/26/2011  . Hyperlipidemia associated with type 2 diabetes mellitus ([place]) 12/26/2011  . Avascular necrosis of medial femoral condyle ([place])    [name] DPT [name] 12/23/2019, 1:46 PM  [facility] [address], [place], [address] Phone: [phone]   Fax:  [phone]  Name: Todd Diaz MRN: [id number] Date of Birth: 06-03-1940

## 2019-12-25 ENCOUNTER — Ambulatory Visit: Payer: Medicare PPO | Admitting: Physical Therapy

## 2019-12-25 ENCOUNTER — Other Ambulatory Visit: Payer: Self-pay

## 2019-12-25 ENCOUNTER — Encounter: Payer: Self-pay | Admitting: Physical Therapy

## 2019-12-25 DIAGNOSIS — R293 Abnormal posture: Secondary | ICD-10-CM

## 2019-12-25 DIAGNOSIS — E119 Type 2 diabetes mellitus without complications: Secondary | ICD-10-CM | POA: Diagnosis not present

## 2019-12-25 DIAGNOSIS — M6281 Muscle weakness (generalized): Secondary | ICD-10-CM | POA: Diagnosis not present

## 2019-12-25 DIAGNOSIS — M25512 Pain in left shoulder: Secondary | ICD-10-CM

## 2019-12-25 NOTE — Therapy (Signed)
[facility] [address], [place], [address] Phone: [phone]   Fax:  [phone]  Physical Therapy Treatment  Patient Details  Name: Todd Diaz MRN: [id number] Date of Birth: Jan 04, 1941 No data recorded  Encounter Date: 12/25/2019   PT End of Session - 12/25/19 1521    Visit Number 4    Number of Visits 17    Date for PT Re-Evaluation 12/31/19    Authorization Type 4    Authorization Time Period 10    PT Start Time 0315    PT Stop Time 0400    PT Time Calculation (min) 45 min    Activity Tolerance Patient tolerated treatment well;No increased pain    Behavior During Therapy WFL for tasks assessed/performed           Past Medical History:  Diagnosis Date   Arthritis    Knees   Avascular necrosis of medial femoral condyle (HCC)    Chronic prostatitis    Diabetes mellitus ([place])    controlled with medication   Erectile dysfunction    Hearing loss    Hyperlipidemia    Hypertension    controlled with medication   Hypertrophy of prostate without urinary obstruction and other lower urinary tract symptoms (LUTS)    Infection of urinary tract    ASSESSMENT:Recurrent MRSA UTI F/B Dr. Clayborn Bigness and Dr. Jacqlyn Larsen   TIA (transient ischemic attack) 02/23/2014   R sided weakness and numbness; duration 1 hour; evaluated [facility].   Tobacco use disorder     Past Surgical History:  Procedure Laterality Date   Admission  03/09/2012   [place]; 24 hour admission; LE numbness/tingling.  CT head, MRI brain, carotid dopplers, 2D-echo.   AMPUTATION  2009   L fifth finger surgery for near amputation. [facility].   BLADDER STONE REMOVAL     EYE SURGERY  08/09/2013   B cataract surgery; [place]  04/09/2013   3 repaired; [facility].   JOINT REPLACEMENT     PARTIAL KNEE ARTHROPLASTY  07/24/2011   Procedure: UNICOMPARTMENTAL KNEE;  Surgeon: Lorn Junes, MD;  Location: [facility];  Service: Orthopedics;   Laterality: Left;   PROSTATE SURGERY     RETINAL TEAR REPAIR CRYOTHERAPY  08/09/2013   [facility]    There were no vitals filed for this visit.   Subjective Assessment - 12/25/19 1519    Subjective Reports some soreness in the L shoulder blade following last session. Reports he worked yesterday and felt better working. Compliance with HEP.    Pertinent History Patient is a 79 year old male presenting with L shoulder pain that he describes as "achy" at superior aspect of shoulder and scapula. Has had pain for a few weeks and noticed it the day after he was lifting a heavy carpet for work. Denies immediate pain/popping etc, was just sore the days following, without subsiding. Pt reports worst pain 8/10, best 0/10 exacerbated by heavy lifting, overhead reaching, pushing/pulling. Denies numbness, tingling, or redicular symptoms. 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 Pt likes to golf but has been unable to do so since hurting his L shoulder. Pt is unable to sleep on L side due pain. Pt lives alone and is independent with ADLs.    Limitations Lifting    How long can you sit comfortably? 10 min    How long can you stand comfortably? NA    How long can  you walk comfortably? NA    Patient Stated Goals decrease pain    Pain Onset More than a month ago           Therex Nustep UE 12 seat 12 for gentle protraction/retraction strengthening 54mins L4  Y on wall 2x 12 with max cuing initially for proper technique with good carry over following  Seated row 25# x10; 35# 2 x10 with cuing for posture with good carry over  Standing scaption with GTB pull apart 2x 10 with min cuing to maintain tension on band with good carry over  Standing bilat ER GTB 2x 10 with cuing for proper technique with eccentric control with good carry ove  Seated abd 1# DB 3x 10 with cuing to "feel shoulder blade against wall with decent carry over  AAROM dowel flex sitting with 1/2  foam at tspine for increased thoracic ext with BUE flex x15 2-3sec hold  Pec stretchx60sec        PT Education - 12/25/19 1510    Education provided Yes    Education Details therex form/technique    Person(s) Educated Patient    Methods Explanation;Demonstration;Verbal cues    Comprehension Returned demonstration;Verbalized understanding;Verbal cues required            PT Short Term Goals - 12/10/19 1639      PT SHORT TERM GOAL #1   Title Patient will independent with HEP in order to improve shoulder mobility and strength to be able to improve function at work and home    Baseline 12/10/19 HEP given    Time 4    Period Weeks    Status New    Target Date 01/07/20             PT Long Term Goals - 12/10/19 1649      Additional Long Term Goals   Additional Long Term Goals --      PT LONG TERM GOAL #7   Title --    Baseline --    Time --    Period --    Status --    Target Date --                 Plan - 12/25/19 1523    Clinical Impression Statement PT continued therex progression for increased shoulder/periscapular strength and scapulohumeral rhythm with good success. patient is able to comply with all cuing for proper technique, with most difficulty with motor control of overhead mobility with good carry over of cuing. Pt is motivated throughout session with no increased pain. PT will continue progression as able.    Personal Factors and Comorbidities Age;Sex;Education;Comorbidity 1;Comorbidity 2    Comorbidities HTN, DM, arthiritis    Examination-Activity Limitations Lift;Reach Overhead;Sleep    Examination-Participation Restrictions Occupation    Stability/Clinical Decision Making Evolving/Moderate complexity    Clinical Decision Making Moderate    Rehab Potential Good    Clinical Impairments Affecting Rehab Potential positive: motivated, response to exercise; negative: co-morbidities; patient's clinical presentation is stable as he is responding to  exercise and symptoms easily resolve.    PT Duration 8 weeks    PT Treatment/Interventions ADLs/Self Care Home Management;Cryotherapy;Electrical Stimulation;Moist Heat;Traction;Balance training;Therapeutic exercise;Therapeutic activities;Functional mobility training;Stair training;Gait training;Neuromuscular re-education;Ultrasound;Patient/family education;Energy conservation;Dry needling;Manual techniques;Joint Manipulations;Spinal Manipulations;Passive range of motion    PT Next Visit Plan MMT prone y and t, review HEP    PT Home Exercise Plan scapular retractions, pec stretch, isometric ER    Consulted and Agree with Plan of Care Patient  Patient will benefit from skilled therapeutic intervention in order to improve the following deficits and impairments:  Hypomobility,Decreased activity tolerance,Pain,Improper body mechanics,Postural dysfunction,Impaired flexibility,Decreased mobility,Decreased range of motion,Decreased strength,Decreased coordination  Visit Diagnosis: Acute pain of left shoulder  Abnormal posture  Muscle weakness (generalized)     Problem List Patient Active Problem List   Diagnosis Date Noted   Overweight 11/06/2019   Bilateral hearing loss 11/06/2019   Rotator cuff disorder, left 11/06/2019   Trapezius muscle spasm 11/06/2019   Alcohol use disorder, mild, abuse 10/06/2019   Senile purpura ([place]) 06/18/2019   Erectile dysfunction due to arterial insufficiency 12/02/2018   Toenail deformity 11/06/2018   Bilateral cataracts 11/21/2017   Chewing tobacco use 11/21/2017   Nephrogenic adenoma of bladder 10/07/2015   Starting and stopping of urinary stream during micturition 10/07/2015   Benign localized hyperplasia of prostate with urinary obstruction 08/11/2015   Incomplete bladder emptying 08/11/2015   Phimosis 08/11/2015   Degenerative disc disease, lumbar 01/12/2015   Diabetes mellitus ([place]) 01/12/2015   History of TIA  (transient ischemic attack) 01/12/2015   Right leg numbness 08/18/2014   Bilateral inguinal hernia (BIH) s/p lap reapir w mesh 03/03/2013 02/26/2013   Panic attack 05/20/2012   Essential hypertension, benign 12/26/2011   Impotence 12/26/2011   Hyperlipidemia associated with type 2 diabetes mellitus ([place]) 12/26/2011   Avascular necrosis of medial femoral condyle ([place])    [name] DPT [name] 12/25/2019, 3:52 PM  [facility] [address], [place], [address] Phone: [phone]   Fax:  [phone]  Name: Todd Diaz MRN: [id number] Date of Birth: 12/16/1940

## 2019-12-30 ENCOUNTER — Ambulatory Visit: Payer: Medicare PPO | Admitting: Physical Therapy

## 2020-01-05 ENCOUNTER — Other Ambulatory Visit: Payer: Self-pay

## 2020-01-05 ENCOUNTER — Ambulatory Visit: Payer: Medicare PPO | Admitting: Physical Therapy

## 2020-01-05 ENCOUNTER — Encounter: Payer: Self-pay | Admitting: Physical Therapy

## 2020-01-05 DIAGNOSIS — R293 Abnormal posture: Secondary | ICD-10-CM | POA: Diagnosis not present

## 2020-01-05 DIAGNOSIS — M6281 Muscle weakness (generalized): Secondary | ICD-10-CM

## 2020-01-05 DIAGNOSIS — M25512 Pain in left shoulder: Secondary | ICD-10-CM

## 2020-01-05 NOTE — Therapy (Signed)
[facility] [address] [place], [address] Phone: [phone]   Fax:  [phone]  Physical Therapy Treatment  Patient Details  Name: Todd Diaz MRN: [id number] Date of Birth: 1940-12-01 No data recorded  Encounter Date: 01/05/2020   PT End of Session - 01/05/20 1430    Visit Number 5    Number of Visits 17    Date for PT Re-Evaluation 02/06/20    Authorization Type 5    Authorization Time Period 10    PT Start Time 0227    PT Stop Time 0305    PT Time Calculation (min) 38 min    Activity Tolerance Patient tolerated treatment well;No increased pain    Behavior During Therapy WFL for tasks assessed/performed           Past Medical History:  Diagnosis Date  . Arthritis    Knees  . Avascular necrosis of medial femoral condyle ([place])   . Chronic prostatitis   . Diabetes mellitus ([place])    controlled with medication  . Erectile dysfunction   . Hearing loss   . Hyperlipidemia   . Hypertension    controlled with medication  . Hypertrophy of prostate without urinary obstruction and other lower urinary tract symptoms (LUTS)   . Infection of urinary tract    ASSESSMENT:Recurrent MRSA UTI F/B Dr. Clayborn Bigness and Dr. Jacqlyn Larsen  . TIA (transient ischemic attack) 02/23/2014   R sided weakness and numbness; duration 1 hour; evaluated [facility].  . Tobacco use disorder     Past Surgical History:  Procedure Laterality Date  . Admission  03/09/2012   [facility]; 24 hour admission; LE numbness/tingling.  CT head, MRI brain, carotid dopplers, 2D-echo.  . AMPUTATION  2009   L fifth finger surgery for near amputation. [facility].  [facility] BLADDER STONE REMOVAL    . EYE SURGERY  08/09/2013   B cataract surgery; [place]  . HERNIA REPAIR  04/09/2013   3 repaired; [facility].  . JOINT REPLACEMENT    . PARTIAL KNEE ARTHROPLASTY  07/24/2011   Procedure: UNICOMPARTMENTAL KNEE;  Surgeon: Lorn Junes, MD;  Location: [facility];  Service: Orthopedics;   Laterality: Left;  . PROSTATE SURGERY    . RETINAL TEAR REPAIR CRYOTHERAPY  08/09/2013   [facility]    There were no vitals filed for this visit.   Subjective Assessment - 01/05/20 1429    Subjective Patient reports no pain today, and that he has been feeling much better over the weekend. Reports compliance with HEP.    Pertinent History Patient is a 79 year old male presenting with L shoulder pain that he describes as "achy" at superior aspect of shoulder and scapula. Has had pain for a few weeks and noticed it the day after he was lifting a heavy carpet for work. Denies immediate pain/popping etc, was just sore the days following, without subsiding. Pt reports worst pain 8/10, best 0/10 exacerbated by heavy lifting, overhead reaching, pushing/pulling. Denies numbness, tingling, or redicular symptoms. 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 Pt likes to golf but has been unable to do so since hurting his L shoulder. Pt is unable to sleep on L side due pain. Pt lives alone and is independent with ADLs.    Limitations Lifting    How long can you sit comfortably? 10 min    How long can you stand comfortably? NA    How long can you walk  comfortably? NA    Patient Stated Goals decrease pain             Therex Nustep UE 12 seat 12 for gentle protraction/retraction strengthening 61mins L4  Seated [employer] 4# DB bilat  3x 10 with difficulty preventing scapular protraction   Scaption with GTB 2x 10; BlueTB x10 with cuing initially for technique with good carry over following  Lat pulldown 35# x10; 45# 2x 10 with min cuing for eccentric control with good carry over  Standing 90/90 ER with back on wall x12 in limited ROM  Standing bilat ER GTB 3x 10 with cuing for proper technique with eccentric control with good carry ove  AAROMdowel flex sitting with 1/2 foam at tspine for increased thoracic ext with BUE flex x15 2-3sec hold  Pec  stretchx60sec      PT Short Term Goals - 12/10/19 1639      PT SHORT TERM GOAL #1   Title Patient will independent with HEP in order to improve shoulder mobility and strength to be able to improve function at work and home    Baseline 12/10/19 HEP given    Time 4    Period Weeks    Status New    Target Date 01/07/20             PT Long Term Goals - 12/10/19 1649      Additional Long Term Goals   Additional Long Term Goals --      PT LONG TERM GOAL #7   Title --    Baseline --    Time --    Period --    Status --    Target Date --                 Plan - 01/05/20 1447    Clinical Impression Statement PT continued therex progression for increased shoulder/periscapular strength and motor control with good success. Pt able to comply with all cuing for proper technique of therex with good motivation throughout session. PT will reasses d/c readiness next session    Personal Factors and Comorbidities Age;Sex;Education;Comorbidity 1;Comorbidity 2    Comorbidities HTN, DM, arthiritis    Examination-Activity Limitations Lift;Reach Overhead;Sleep    Examination-Participation Restrictions Occupation    Stability/Clinical Decision Making Evolving/Moderate complexity    Clinical Decision Making Moderate    Rehab Potential Good    Clinical Impairments Affecting Rehab Potential positive: motivated, response to exercise; negative: co-morbidities; patient's clinical presentation is stable as he is responding to exercise and symptoms easily resolve.    PT Frequency 2x / week    PT Duration 8 weeks    PT Treatment/Interventions ADLs/Self Care Home Management;Cryotherapy;Electrical Stimulation;Moist Heat;Traction;Balance training;Therapeutic exercise;Therapeutic activities;Functional mobility training;Stair training;Gait training;Neuromuscular re-education;Ultrasound;Patient/family education;Energy conservation;Dry needling;Manual techniques;Joint Manipulations;Spinal  Manipulations;Passive range of motion    PT Next Visit Plan REASSESS    PT Home Exercise Plan scapular retractions, pec stretch, isometric ER    Consulted and Agree with Plan of Care Patient           Patient will benefit from skilled therapeutic intervention in order to improve the following deficits and impairments:  Hypomobility,Decreased activity tolerance,Pain,Improper body mechanics,Postural dysfunction,Impaired flexibility,Decreased mobility,Decreased range of motion,Decreased strength,Decreased coordination  Visit Diagnosis: Acute pain of left shoulder  Abnormal posture  Muscle weakness (generalized)     Problem List Patient Active Problem List   Diagnosis Date Noted  . Overweight 11/06/2019  . Bilateral hearing loss 11/06/2019  . Rotator cuff disorder, left 11/06/2019  .  Trapezius muscle spasm 11/06/2019  . Alcohol use disorder, mild, abuse 10/06/2019  . Senile purpura (HCC) 06/18/2019  . Erectile dysfunction due to arterial insufficiency 12/02/2018  . Toenail deformity 11/06/2018  . Bilateral cataracts 11/21/2017  . Chewing tobacco use 11/21/2017  . Nephrogenic adenoma of bladder 10/07/2015  . Starting and stopping of urinary stream during micturition 10/07/2015  . Benign localized hyperplasia of prostate with urinary obstruction 08/11/2015  . Incomplete bladder emptying 08/11/2015  . Phimosis 08/11/2015  . Degenerative disc disease, lumbar 01/12/2015  . Diabetes mellitus (HCC) 01/12/2015  . History of TIA (transient ischemic attack) 01/12/2015  . Right leg numbness 08/18/2014  . Bilateral inguinal hernia (BIH) s/p lap reapir w mesh 03/03/2013 02/26/2013  . Panic attack 05/20/2012  . Essential hypertension, benign 12/26/2011  . Impotence 12/26/2011  . Hyperlipidemia associated with type 2 diabetes mellitus (HCC) 12/26/2011  . Avascular necrosis of medial femoral condyle (HCC)    Hilda Lias DPT Hilda Lias 01/05/2020, 3:03 PM  [facility] [address] [place], [address] Phone: [phone]   Fax:  [phone]  Name: Todd Diaz MRN: [id number] Date of Birth: [birth year]

## 2020-01-06 ENCOUNTER — Encounter: Payer: Medicare PPO | Admitting: Physical Therapy

## 2020-01-07 ENCOUNTER — Encounter: Payer: Self-pay | Admitting: Physical Therapy

## 2020-01-07 ENCOUNTER — Ambulatory Visit: Payer: Medicare PPO | Admitting: Physical Therapy

## 2020-01-07 ENCOUNTER — Other Ambulatory Visit: Payer: Self-pay

## 2020-01-07 DIAGNOSIS — M6281 Muscle weakness (generalized): Secondary | ICD-10-CM | POA: Diagnosis not present

## 2020-01-07 DIAGNOSIS — R293 Abnormal posture: Secondary | ICD-10-CM | POA: Diagnosis not present

## 2020-01-07 DIAGNOSIS — M25512 Pain in left shoulder: Secondary | ICD-10-CM

## 2020-01-07 NOTE — Therapy (Signed)
[facility] [address], [place], [address] Phone: [phone]   Fax:  [phone]  Physical Therapy Treatment/DC Summary Reporting Period 12/10/19 - 01/07/20  Patient Details  Name: Todd Diaz MRN: [id number] Date of Birth: [birth year] No data recorded  Encounter Date: 01/07/2020   PT End of Session - 01/07/20 1531    Visit Number 6    Number of Visits 17    Date for PT Re-Evaluation 02/06/20    Authorization Type 6    Authorization Time Period 10    PT Start Time 0319    PT Stop Time 5573    PT Time Calculation (min) 38 min    Activity Tolerance Patient tolerated treatment well;No increased pain    Behavior During Therapy WFL for tasks assessed/performed           Past Medical History:  Diagnosis Date  . Arthritis    Knees  . Avascular necrosis of medial femoral condyle ([place])   . Chronic prostatitis   . Diabetes mellitus ([place])    controlled with medication  . Erectile dysfunction   . Hearing loss   . Hyperlipidemia   . Hypertension    controlled with medication  . Hypertrophy of prostate without urinary obstruction and other lower urinary tract symptoms (LUTS)   . Infection of urinary tract    ASSESSMENT:Recurrent MRSA UTI F/B Dr. Clayborn Bigness and Dr. Jacqlyn Larsen  . TIA (transient ischemic attack) 02/23/2014   R sided weakness and numbness; duration 1 hour; evaluated [facility].  . Tobacco use disorder     Past Surgical History:  Procedure Laterality Date  . Admission  03/09/2012   [facility]; 24 hour admission; LE numbness/tingling.  CT head, MRI brain, carotid dopplers, 2D-echo.  . AMPUTATION  2009   L fifth finger surgery for near amputation. [place].  Marland [facility] BLADDER STONE REMOVAL    . EYE SURGERY  08/09/2013   B cataract surgery; [facility]  . HERNIA REPAIR  04/09/2013   3 repaired; [facility].  . JOINT REPLACEMENT    . PARTIAL KNEE ARTHROPLASTY  07/24/2011   Procedure: UNICOMPARTMENTAL KNEE;  Surgeon: Lorn Junes,  MD;  Location: [facility];  Service: Orthopedics;  Laterality: Left;  . PROSTATE SURGERY    . RETINAL TEAR REPAIR CRYOTHERAPY  08/09/2013   [facility]    There were no vitals filed for this visit.   Subjective Assessment - 01/07/20 1524    Subjective Pt reports no pain today, is doing well overall and is ready to d/c PT to HEP.    Pertinent History Patient is a 79 year old male presenting with L shoulder pain that he describes as "achy" at superior aspect of shoulder and scapula. Has had pain for a few weeks and noticed it the day after he was lifting a heavy carpet for work. Denies immediate pain/popping etc, was just sore the days following, without subsiding. Pt reports worst pain 8/10, best 0/10 exacerbated by heavy lifting, overhead reaching, pushing/pulling. Denies numbness, tingling, or redicular symptoms. 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 Pt likes to golf but has been unable to do so since hurting his L shoulder. Pt is unable to sleep on L side due pain. Pt lives alone and is independent with ADLs.    Limitations Lifting    How long can you sit comfortably? 10 min    How long can you stand comfortably? NA    How long  can you walk comfortably? NA    Patient Stated Goals decrease pain    Pain Onset More than a month ago              Therex Nustep UE 12 seat 12 for gentle protraction/retraction strengthening 79mns L4   PT reviewed the following HEP with patient with patient able to demonstrate a set of the following with min cuing for correction needed. PT educated patient on parameters of therex (how/when to inc/decrease intensity, frequency, rep/set range, stretch hold time, and purpose of therex) with verbalized understanding.  Access Code: [email or web address] Overhead Press with Dumbbells - 1 x daily - 2-3 x weekly - 3 sets - 10 reps Seated Shoulder Horizontal Abduction with Resistance - Thumbs Up - 1 x daily - 2-3 x weekly - 3 sets - 10  reps Standing Shoulder Scaption - 1 x daily - 2-3 x weekly - 3 sets - 10 reps Shoulder External Rotation and Scapular Retraction with Resistance - 1 x daily - 2-3 x weekly - 3 sets - 10 reps Seated Thoracic Lumbar Extension with Pectoralis Stretch - 2 x daily - 7 x weekly - 12 reps - 2sec hold Doorway Pec Stretch at 60 Degrees Abduction with Arm Straight - 7 x weekly - 30sec hold      PT Education - 01/07/20 1531    Education provided Yes    Education Details DC HEP and education    Person(s) Educated Patient    Methods Explanation;Demonstration;Verbal cues    Comprehension Verbalized understanding;Returned demonstration;Verbal cues required            PT Short Term Goals - 12/10/19 1639      PT SHORT TERM GOAL #1   Title Patient will independent with HEP in order to improve shoulder mobility and strength to be able to improve function at work and home    Baseline 12/10/19 HEP given    Time 4    Period Weeks    Status New    Target Date 01/07/20             PT Long Term Goals - 01/07/20 1526      PT LONG TERM GOAL #1   Title Patient will increase FOTO score to 72 to demonstrate predicted increase in functional mobility to complete ADLs    Baseline 12/10/19 52; 01/07/20 71    Time 8    Period Weeks    Status Achieved      PT LONG TERM GOAL #2   Title Patient will increase L RTC and periscapular MMT to at least 4+/5 for  in order to pick up and transfer carpets and mattresses at work with decreased difficulty.    Baseline 12/10/19 R/L flex 2+/2+;  abd 2+/2+; ER 5/3+;; 01/07/20 R/L flex 4+/4; abd 5/4+ ER 5/4+    Time 8    Period Weeks    Status Achieved      PT LONG TERM GOAL #3   Title Pt will report a 3 point difference with worst pain on NPRS in order to demonstrate clinically significant pain reduction    Baseline 12/10/19 worst 7/10; 01/07/20 no pain    Time 8    Period Weeks    Status Achieved      PT LONG TERM GOAL #4   Title Pt will increase L active  shoulder ROM to be WFL to be able to complete self ADLs.    Baseline 12/10/19 flexion 145, abd 97, ER 48; 01/07/20  all motions WNL, some diffiuclty with apleys IR bilat    Time 8    Period Weeks    Status Achieved                 Plan - 01/07/20 1558    Clinical Impression Statement PT reassessed goals this session where patient has met all goals to safely d/c PT to robust HEP for maintenance of gains made. Pt is able to verbalize and demonstrate understanding of all HEP recommendations with minimal cuing needed. PT educated patient on therex parameters with good understanding. Pt given clinic contact info should any further questions or concerns arise.    Personal Factors and Comorbidities Age;Sex;Education;Comorbidity 1;Comorbidity 2    Comorbidities HTN, DM, arthiritis    Examination-Activity Limitations Lift;Reach Overhead;Sleep    Examination-Participation Restrictions Occupation    Stability/Clinical Decision Making Evolving/Moderate complexity    Clinical Decision Making Moderate    Rehab Potential Good    Clinical Impairments Affecting Rehab Potential positive: motivated, response to exercise; negative: co-morbidities; patient's clinical presentation is stable as he is responding to exercise and symptoms easily resolve.    PT Frequency 2x / week    PT Duration 8 weeks    PT Treatment/Interventions ADLs/Self Care Home Management;Cryotherapy;Electrical Stimulation;Moist Heat;Traction;Balance training;Therapeutic exercise;Therapeutic activities;Functional mobility training;Stair training;Gait training;Neuromuscular re-education;Ultrasound;Patient/family education;Energy conservation;Dry needling;Manual techniques;Joint Manipulations;Spinal Manipulations;Passive range of motion    PT Home Exercise Plan see dc 12/29    Consulted and Agree with Plan of Care Patient           Patient will benefit from skilled therapeutic intervention in order to improve the following deficits and  impairments:  Hypomobility,Decreased activity tolerance,Pain,Improper body mechanics,Postural dysfunction,Impaired flexibility,Decreased mobility,Decreased range of motion,Decreased strength,Decreased coordination  Visit Diagnosis: Acute pain of left shoulder  Abnormal posture  Muscle weakness (generalized)     Problem List Patient Active Problem List   Diagnosis Date Noted  . Overweight 11/06/2019  . Bilateral hearing loss 11/06/2019  . Rotator cuff disorder, left 11/06/2019  . Trapezius muscle spasm 11/06/2019  . Alcohol use disorder, mild, abuse 10/06/2019  . Senile purpura ([place]) 06/18/2019  . Erectile dysfunction due to arterial insufficiency 12/02/2018  . Toenail deformity 11/06/2018  . Bilateral cataracts 11/21/2017  . Chewing tobacco use 11/21/2017  . Nephrogenic adenoma of bladder 10/07/2015  . Starting and stopping of urinary stream during micturition 10/07/2015  . Benign localized hyperplasia of prostate with urinary obstruction 08/11/2015  . Incomplete bladder emptying 08/11/2015  . Phimosis 08/11/2015  . Degenerative disc disease, lumbar 01/12/2015  . Diabetes mellitus ([place]) 01/12/2015  . History of TIA (transient ischemic attack) 01/12/2015  . Right leg numbness 08/18/2014  . Bilateral inguinal hernia (BIH) s/p lap reapir w mesh 03/03/2013 02/26/2013  . Panic attack 05/20/2012  . Essential hypertension, benign 12/26/2011  . Impotence 12/26/2011  . Hyperlipidemia associated with type 2 diabetes mellitus ([place]) 12/26/2011  . Avascular necrosis of medial femoral condyle ([place])    [name] DPT [name] 01/07/2020, 4:06 PM  [facility] [address] [place], [address] Phone: [phone]   Fax:  [phone]  Name: MCKOY BHAKTA MRN: [id number] Date of Birth: 21-Oct-1940

## 2020-01-08 ENCOUNTER — Encounter: Payer: Medicare PPO | Admitting: Physical Therapy

## 2020-02-11 ENCOUNTER — Other Ambulatory Visit: Payer: Self-pay | Admitting: Physician Assistant

## 2020-02-17 ENCOUNTER — Other Ambulatory Visit: Payer: Self-pay | Admitting: Family Medicine

## 2020-02-17 MED ORDER — LOVASTATIN 40 MG PO TABS
40.0000 mg | ORAL_TABLET | Freq: Every day | ORAL | 3 refills | Status: DC
Start: 2020-02-17 — End: 2021-08-02

## 2020-02-17 NOTE — Telephone Encounter (Signed)
Medication Refill - Medication:   lovastatin (MEVACOR) 40 MG tablet     Preferred Pharmacy (with phone number or street name):  [id number] [address], [place] [facility] Phone:  [phone]  Fax:  [phone]       Agent: Please be advised that RX refills may take up to 3 business days. We ask that you follow-up with your pharmacy.

## 2020-02-24 ENCOUNTER — Other Ambulatory Visit: Payer: Self-pay | Admitting: Family Medicine

## 2020-05-06 ENCOUNTER — Other Ambulatory Visit: Payer: Self-pay

## 2020-05-06 ENCOUNTER — Ambulatory Visit: Payer: Medicare PPO | Admitting: Family Medicine

## 2020-05-06 ENCOUNTER — Encounter: Payer: Self-pay | Admitting: Family Medicine

## 2020-05-06 VITALS — BP 151/92 | HR 102 | Temp 98.4°F | Resp 18 | Wt 184.0 lb

## 2020-05-06 DIAGNOSIS — E785 Hyperlipidemia, unspecified: Secondary | ICD-10-CM | POA: Diagnosis not present

## 2020-05-06 DIAGNOSIS — D692 Other nonthrombocytopenic purpura: Secondary | ICD-10-CM

## 2020-05-06 DIAGNOSIS — E1169 Type 2 diabetes mellitus with other specified complication: Secondary | ICD-10-CM | POA: Diagnosis not present

## 2020-05-06 DIAGNOSIS — M25562 Pain in left knee: Secondary | ICD-10-CM | POA: Diagnosis not present

## 2020-05-06 DIAGNOSIS — E1159 Type 2 diabetes mellitus with other circulatory complications: Secondary | ICD-10-CM | POA: Diagnosis not present

## 2020-05-06 DIAGNOSIS — I152 Hypertension secondary to endocrine disorders: Secondary | ICD-10-CM | POA: Diagnosis not present

## 2020-05-06 LAB — POCT GLYCOSYLATED HEMOGLOBIN (HGB A1C)
Est. average glucose Bld gHb Est-mCnc: 120
Hemoglobin A1C: 5.8 % — AB (ref 4.0–5.6)

## 2020-05-06 MED ORDER — LISINOPRIL 40 MG PO TABS: 40.0000 mg | ORAL_TABLET | Freq: Every day | ORAL | 3 refills | Status: DC

## 2020-05-06 NOTE — Assessment & Plan Note (Signed)
Chronic and stable Continue to monitor 

## 2020-05-06 NOTE — Patient Instructions (Signed)
Increase Lisinopril to 40mg daily.

## 2020-05-06 NOTE — Assessment & Plan Note (Signed)
Popping and some pain in left knee that was not previously present Has history of avascular necrosis of femoral condyle Referral back to orthopedist

## 2020-05-06 NOTE — Assessment & Plan Note (Signed)
Uncontrolled today on initial and recheck Not monitoring home blood pressures- encouraged Continue HCTZ at current dose Increase lisinopril to 40 mg daily Recheck BMP Follow-up in 6 to 8 weeks

## 2020-05-06 NOTE — Progress Notes (Signed)
Established patient visit   Patient: Todd Diaz   DOB: 07/20/1940   80 y.o. Male  MRN: [id number] Visit Date: 05/06/2020  Today's healthcare provider: Lavon Paganini, MD   Chief Complaint  Patient presents with  . Diabetes  . Hypertension   Subjective    Diabetes Pertinent negatives for diabetes include no chest pain and no fatigue.  Hypertension Pertinent negatives include no chest pain, neck pain, palpitations or shortness of breath.    He is here to monitor his diabetes and hypertension. He blood pressure has been elevated lately and he is here for management advice. He is compliant with taking his hydro-thiazole and lisinopril. He is experiencing bruising due to his blood thinner (left arm predominant). He is also requesting for a referral for his knee he feels "popping" in his left knee. He would like to have further evaluation. He underwent a knee procedure in the past and has some concerns that this my contributes to this. He denies chest pain, SOB, fever, abdominal pain, cough, chills, sore throat, dysuria, urinary incontinence, back pain, HA, or N/VD.    Diabetes Mellitus Type II, Follow-up  Lab Results  Component Value Date   HGBA1C 5.8 (A) 05/06/2020   HGBA1C 5.5 10/06/2019   HGBA1C 5.8 (A) 06/18/2019   Wt Readings from Last 3 Encounters:  05/06/20 184 lb (83.5 kg)  12/18/19 180 lb (81.6 kg)  11/06/19 188 lb (85.3 kg)   Last seen for diabetes 6 months ago.  Management since then includes continue same medications. He reports good compliance with treatment. He is not having side effects.  Symptoms: No fatigue No foot ulcerations  No appetite changes No nausea  No paresthesia of the feet  No polydipsia  No polyuria No visual disturbances   No vomiting     Home blood sugar records: patient unsure of home blood sugar readings  Episodes of hypoglycemia? No    Current insulin regiment: none Most Recent Eye Exam: not UTD Current exercise:  none Current diet habits: well balanced  Pertinent Labs: Lab Results  Component Value Date   CHOL 148 10/06/2019   HDL 34 (L) 10/06/2019   LDLCALC 77 10/06/2019   TRIG 223 (H) 10/06/2019   CHOLHDL 4.4 10/06/2019   Lab Results  Component Value Date   NA 139 10/06/2019   K 3.8 10/06/2019   CREATININE 0.89 10/06/2019   GFRNONAA 81 10/06/2019   GFRAA 93 10/06/2019   GLUCOSE 155 (H) 10/06/2019     --------------------------------------------------------------------------------------------------  Hypertension, follow-up  BP Readings from Last 3 Encounters:  05/06/20 (!) 151/92  12/18/19 (!) 159/92  11/06/19 136/62   Wt Readings from Last 3 Encounters:  05/06/20 184 lb (83.5 kg)  12/18/19 180 lb (81.6 kg)  11/06/19 188 lb (85.3 kg)     He was last seen for hypertension 6 months ago.  BP at that visit was 152/81. Management since that visit includes continue same medications.  He reports good compliance with treatment. He is not having side effects.  He is following a Regular diet. He is not exercising. He does not smoke.  Use of agents associated with hypertension: NSAIDS.   Outside blood pressures are not checked. Symptoms: No chest pain No chest pressure  No palpitations No syncope  No dyspnea No orthopnea  No paroxysmal nocturnal dyspnea No lower extremity edema   Pertinent labs: Lab Results  Component Value Date   CHOL 148 10/06/2019   HDL 34 (L) 10/06/2019  LDLCALC 77 10/06/2019   TRIG 223 (H) 10/06/2019   CHOLHDL 4.4 10/06/2019   Lab Results  Component Value Date   NA 139 10/06/2019   K 3.8 10/06/2019   CREATININE 0.89 10/06/2019   GFRNONAA 81 10/06/2019   GFRAA 93 10/06/2019   GLUCOSE 155 (H) 10/06/2019     The ASCVD Risk score Mikey Bussing DC Jr., et al., 2013) failed to calculate for the following reasons:   The 2013 ASCVD risk score is only valid for ages 39 to 13    ---------------------------------------------------------------------------------------------------      Medications: Outpatient Medications Prior to Visit  Medication Sig  . aspirin EC 81 MG tablet Take 81 mg by mouth daily.  . Blood Glucose Calibration (ACCU-CHEK COMPACT PLUS CONTROL) SOLN Test blood sugar daily. Dx code: E11.9  . blood glucose meter kit and supplies KIT Dispense based on patient and insurance preference. Check sugar once daily as directed. (FOR ICD-9 250.00, 250.01).  . Blood Glucose Monitoring Suppl (BLOOD GLUCOSE METER KIT AND SUPPLIES) KIT Test blood sugar daily. Dx code: E11.9  . clopidogrel (PLAVIX) 75 MG tablet TAKE 1 TABLET(75 MG) BY MOUTH DAILY  . gabapentin (NEURONTIN) 300 MG capsule TAKE 1 CAPSULE(300 MG) BY MOUTH AT BEDTIME  . glucose blood test strip Test blood sugar daily. Dx Code: E11.9  . hydrochlorothiazide (HYDRODIURIL) 12.5 MG tablet Take 1 tablet (12.5 mg total) by mouth daily.  . Lancets MISC Test blood sugar daily. Dx code: E11.9  . lovastatin (MEVACOR) 40 MG tablet Take 1 tablet (40 mg total) by mouth at bedtime.  . sildenafil (REVATIO) 20 MG tablet TAKE 2 TO 5 TABLETS BY MOUTH 1 HOUR BEFORE SEXUAL ACTIVITY  . tamsulosin (FLOMAX) 0.4 MG CAPS capsule TAKE 1 CAPSULE(0.4 MG) BY MOUTH DAILY AFTER BREAKFAST  . [DISCONTINUED] lisinopril (ZESTRIL) 20 MG tablet TAKE 1 TABLET(20 MG) BY MOUTH DAILY   No facility-administered medications prior to visit.    Review of Systems  Constitutional: Negative for appetite change, chills, fatigue and fever.  HENT: Negative for congestion, ear pain, sinus pressure, sinus pain and sore throat.   Eyes: Negative for pain.  Respiratory: Negative for chest tightness, shortness of breath and wheezing.   Cardiovascular: Negative for chest pain and palpitations.  Gastrointestinal: Negative for abdominal pain, blood in stool, nausea and vomiting.  Genitourinary: Negative for flank pain, frequency and urgency.   Musculoskeletal: Negative for back pain, joint swelling and neck pain.       Crepitus in left knee when ambulating       Objective    BP (!) 151/92 (BP Location: Right Arm, Patient Position: Sitting, Cuff Size: Normal)   Pulse (!) 102   Temp 98.4 F (36.9 C) (Oral)   Resp 18   Wt 184 lb (83.5 kg)   SpO2 95% Comment: room air  BMI 24.28 kg/m     Physical Exam Vitals reviewed.  Constitutional:      General: He is not in acute distress.    Appearance: Normal appearance. He is not diaphoretic.  HENT:     Head: Normocephalic and atraumatic.  Eyes:     General: No scleral icterus.    Conjunctiva/sclera: Conjunctivae normal.  Cardiovascular:     Rate and Rhythm: Normal rate and regular rhythm.     Pulses: Normal pulses.     Heart sounds: Normal heart sounds. No murmur heard.   Pulmonary:     Effort: Pulmonary effort is normal. No respiratory distress.     Breath sounds: Normal  breath sounds. No wheezing or rhonchi.  Abdominal:     General: There is no distension.     Palpations: Abdomen is soft.     Tenderness: There is no abdominal tenderness.  Musculoskeletal:     Cervical back: Neck supple.     Right knee: Normal. No crepitus.     Left knee: Crepitus (extension ) present. No swelling. Normal range of motion.     Right lower leg: No edema.     Left lower leg: No edema.  Lymphadenopathy:     Cervical: No cervical adenopathy.  Skin:    General: Skin is warm and dry.     Capillary Refill: Capillary refill takes less than 2 seconds.     Findings: No rash.  Neurological:     Mental Status: He is alert and oriented to person, place, and time.     Cranial Nerves: No cranial nerve deficit.  Psychiatric:        Mood and Affect: Mood normal.        Behavior: Behavior normal.     Results for orders placed or performed in visit on 05/06/20  POCT HgB A1C  Result Value Ref Range   Hemoglobin A1C 5.8 (A) 4.0 - 5.6 %   Est. average glucose Bld gHb Est-mCnc 120      Assessment & Plan     Problem List Items Addressed This Visit      Cardiovascular and Mediastinum   Hypertension associated with diabetes ([place])    Uncontrolled today on initial and recheck Not monitoring home blood pressures- encouraged Continue HCTZ at current dose Increase lisinopril to 40 mg daily Recheck BMP Follow-up in 6 to 8 weeks      Relevant Medications   lisinopril (ZESTRIL) 40 MG tablet   Other Relevant Orders   Basic Metabolic Panel (BMET)   Senile purpura ([facility])    Chronic and stable Continue to monitor      Relevant Medications   lisinopril (ZESTRIL) 40 MG tablet     Endocrine   Hyperlipidemia associated with type 2 diabetes mellitus ([place])    Well-controlled on last check Continue statin at current dose      Relevant Medications   lisinopril (ZESTRIL) 40 MG tablet   Diabetes mellitus ([place]) - Primary    Well-controlled with diet and no medications Associated with HTN and HLD Up-to-date on vaccinations On ACE inhibitor and statin Follow-up in 6 months      Relevant Medications   lisinopril (ZESTRIL) 40 MG tablet   Other Relevant Orders   POCT HgB A1C (Completed)     Other   Acute pain of left knee    Popping and some pain in left knee that was not previously present Has history of avascular necrosis of femoral condyle Referral back to orthopedist      Relevant Orders   Ambulatory referral to Orthopedic Surgery      Return in about 6 weeks (around 06/17/2020) for BP f/u.      I,Essence Turner,acting as a Education administrator for Lavon Paganini, MD.,have documented all relevant documentation on the behalf of Lavon Paganini, MD,as directed by  Lavon Paganini, MD while in the presence of Lavon Paganini, MD.  I, Lavon Paganini, MD, have reviewed all documentation for this visit. The documentation on 05/06/20 for the exam, diagnosis, procedures, and orders are all accurate and complete.   Devonne Kitchen, Dionne Bucy, MD, MPH [facility]

## 2020-05-06 NOTE — Assessment & Plan Note (Signed)
Well-controlled on last check Continue statin at current dose

## 2020-05-06 NOTE — Assessment & Plan Note (Signed)
Well-controlled with diet and no medications Associated with HTN and HLD Up-to-date on vaccinations On ACE inhibitor and statin Follow-up in 6 months

## 2020-05-07 LAB — BASIC METABOLIC PANEL
BUN/Creatinine Ratio: 10 (ref 10–24)
BUN: 9 mg/dL (ref 8–27)
CO2: 27 mmol/L (ref 20–29)
Calcium: 9.8 mg/dL (ref 8.6–10.2)
Chloride: 100 mmol/L (ref 96–106)
Creatinine, Ser: 0.92 mg/dL (ref 0.76–1.27)
Glucose: 121 mg/dL — ABNORMAL HIGH (ref 65–99)
Potassium: 4.3 mmol/L (ref 3.5–5.2)
Sodium: 141 mmol/L (ref 134–144)
eGFR: 84 mL/min/{1.73_m2} (ref >59)

## 2020-05-18 DIAGNOSIS — M25562 Pain in left knee: Secondary | ICD-10-CM | POA: Diagnosis not present

## 2020-05-24 ENCOUNTER — Other Ambulatory Visit: Payer: Self-pay | Admitting: Urology

## 2020-06-16 ENCOUNTER — Encounter: Payer: Self-pay | Admitting: Emergency Medicine

## 2020-06-16 ENCOUNTER — Emergency Department
Admission: EM | Admit: 2020-06-16 | Discharge: 2020-06-16 | Disposition: A | Discharge status: Home / Self Care | Payer: Medicare PPO | Attending: Emergency Medicine | Admitting: Emergency Medicine

## 2020-06-16 ENCOUNTER — Other Ambulatory Visit: Payer: Self-pay

## 2020-06-16 DIAGNOSIS — S6992XA Unspecified injury of left wrist, hand and finger(s), initial encounter: Secondary | ICD-10-CM | POA: Diagnosis present

## 2020-06-16 DIAGNOSIS — Z96652 Presence of left artificial knee joint: Secondary | ICD-10-CM | POA: Diagnosis not present

## 2020-06-16 DIAGNOSIS — Z7982 Long term (current) use of aspirin: Secondary | ICD-10-CM | POA: Insufficient documentation

## 2020-06-16 DIAGNOSIS — Z87891 Personal history of nicotine dependence: Secondary | ICD-10-CM | POA: Diagnosis not present

## 2020-06-16 DIAGNOSIS — W260XXA Contact with knife, initial encounter: Secondary | ICD-10-CM | POA: Insufficient documentation

## 2020-06-16 DIAGNOSIS — I10 Essential (primary) hypertension: Secondary | ICD-10-CM | POA: Insufficient documentation

## 2020-06-16 DIAGNOSIS — S61412A Laceration without foreign body of left hand, initial encounter: Secondary | ICD-10-CM | POA: Diagnosis not present

## 2020-06-16 DIAGNOSIS — Z79899 Other long term (current) drug therapy: Secondary | ICD-10-CM | POA: Diagnosis not present

## 2020-06-16 DIAGNOSIS — Z23 Encounter for immunization: Secondary | ICD-10-CM | POA: Insufficient documentation

## 2020-06-16 DIAGNOSIS — E119 Type 2 diabetes mellitus without complications: Secondary | ICD-10-CM | POA: Diagnosis not present

## 2020-06-16 MED ORDER — TETANUS-DIPHTH-ACELL PERTUSSIS 5-2.5-18.5 LF-MCG/0.5 IM SUSY
0.5000 mL | PREFILLED_SYRINGE | Freq: Once | INTRAMUSCULAR | Status: AC
  Administered 2020-06-16: 0.5 mL via INTRAMUSCULAR
  Filled 2020-06-16: qty 0.5

## 2020-06-16 MED ORDER — HYDROCODONE-ACETAMINOPHEN 5-325 MG PO TABS: 1.0000 | ORAL_TABLET | Freq: Four times a day (QID) | ORAL | 0 refills | Status: AC | PRN

## 2020-06-16 MED ORDER — LIDOCAINE-EPINEPHRINE (PF) 2 %-1:200000 IJ SOLN
10.0000 mL | Freq: Once | INTRAMUSCULAR | Status: AC
  Administered 2020-06-16: 10 mL
  Filled 2020-06-16: qty 20

## 2020-06-16 NOTE — ED Triage Notes (Signed)
Pt comes into the ED via POV c/o laceration to the left hand.  PT has all bleeding under control at this time and is in NAD.

## 2020-06-16 NOTE — ED Notes (Signed)
Patient verbalizes understanding of discharge instructions. Opportunity for questioning and answers were provided. Armband removed by staff, pt discharged from ED. Ambulated out to lobby  

## 2020-06-16 NOTE — ED Provider Notes (Signed)
[facility] Emergency Department Provider Note   ____________________________________________   Event Date/Time   First MD Initiated Contact with Patient 06/16/20 1312     (approximate)  I have reviewed the triage vital signs and the nursing notes.   HISTORY  Chief Complaint Laceration    HPI Todd Diaz is a 80 y.o. male Libby Maw to the ED with complaint of laceration to his left hand.  Patient states that he was using a knife when he cut his left hand.  Patient is unsure of his last tetanus immunization.  Patient currently takes Plavix and states that he has held pressure on it.  He also drove himself to the emergency department.  He rates his pain currently is 0/10.      Past Medical History:  Diagnosis Date  . Arthritis    Knees  . Avascular necrosis of medial femoral condyle ([place])   . Chronic prostatitis   . Diabetes mellitus ([place])    controlled with medication  . Erectile dysfunction   . Hearing loss   . Hyperlipidemia   . Hypertension    controlled with medication  . Hypertrophy of prostate without urinary obstruction and other lower urinary tract symptoms (LUTS)   . Infection of urinary tract    ASSESSMENT:Recurrent MRSA UTI F/B Dr. Clayborn Bigness and Dr. Jacqlyn Larsen  . TIA (transient ischemic attack) 02/23/2014   R sided weakness and numbness; duration 1 hour; evaluated [facility].  . Tobacco use disorder     Patient Active Problem List   Diagnosis Date Noted  . Acute pain of left knee 05/06/2020  . Bilateral hearing loss 11/06/2019  . Rotator cuff disorder, left 11/06/2019  . Trapezius muscle spasm 11/06/2019  . Alcohol use disorder, mild, abuse 10/06/2019  . Senile purpura ([place]) 06/18/2019  . Erectile dysfunction due to arterial insufficiency 12/02/2018  . Toenail deformity 11/06/2018  . Bilateral cataracts 11/21/2017  . Chewing tobacco use 11/21/2017  . Hypertension associated with diabetes ([place]) 11/21/2017  . Nephrogenic adenoma of bladder  10/07/2015  . Starting and stopping of urinary stream during micturition 10/07/2015  . Benign localized hyperplasia of prostate with urinary obstruction 08/11/2015  . Incomplete bladder emptying 08/11/2015  . Phimosis 08/11/2015  . Degenerative disc disease, lumbar 01/12/2015  . Diabetes mellitus ([place]) 01/12/2015  . History of TIA (transient ischemic attack) 01/12/2015  . Right leg numbness 08/18/2014  . Bilateral inguinal hernia (BIH) s/p lap reapir w mesh 03/03/2013 02/26/2013  . Panic attack 05/20/2012  . Impotence 12/26/2011  . Hyperlipidemia associated with type 2 diabetes mellitus ([place]) 12/26/2011  . Avascular necrosis of medial femoral condyle [facility])     Past Surgical History:  Procedure Laterality Date  . Admission  03/09/2012   [facility]; 24 hour admission; LE numbness/tingling.  CT head, MRI brain, carotid dopplers, 2D-echo.  . AMPUTATION  2009   L fifth finger surgery for near amputation. [place].  Marland Kitchen BLADDER STONE REMOVAL    . EYE SURGERY  08/09/2013   B cataract surgery; [place]  . HERNIA REPAIR  04/09/2013   3 repaired; [place].  . JOINT REPLACEMENT    . PARTIAL KNEE ARTHROPLASTY  07/24/2011   Procedure: UNICOMPARTMENTAL KNEE;  Surgeon: Lorn Junes, MD;  Location: [place];  Service: Orthopedics;  Laterality: Left;  . PROSTATE SURGERY    . RETINAL TEAR REPAIR CRYOTHERAPY  08/09/2013   [name]    Prior to Admission medications   Medication Sig Start Date End Date Taking? Authorizing Provider  HYDROcodone-acetaminophen (NORCO/VICODIN)  5-325 MG tablet Take 1 tablet by mouth every 6 (six) hours as needed for moderate pain. 06/16/20 06/16/21 Yes Johnn Hai, PA-C  aspirin EC 81 MG tablet Take 81 mg by mouth daily.    [provider]  Blood Glucose Calibration (ACCU-CHEK COMPACT PLUS CONTROL) SOLN Test blood sugar daily. Dx code: E11.9 11/21/13   Wardell Honour, MD  blood glucose meter kit and supplies KIT Dispense based on patient and insurance preference. Check sugar once  daily as directed. (FOR ICD-9 250.00, 250.01). 02/07/16   Wardell Honour, MD  Blood Glucose Monitoring Suppl (BLOOD GLUCOSE METER KIT AND SUPPLIES) KIT Test blood sugar daily. Dx code: E11.9 11/21/13   Wardell Honour, MD  clopidogrel (PLAVIX) 75 MG tablet TAKE 1 TABLET(75 MG) BY MOUTH DAILY 02/24/20   Virginia Crews, MD  gabapentin (NEURONTIN) 300 MG capsule TAKE 1 CAPSULE(300 MG) BY MOUTH AT BEDTIME 08/25/19   Bacigalupo, Dionne Bucy, MD  glucose blood test strip Test blood sugar daily. Dx Code: E11.9 02/05/16   Wardell Honour, MD  hydrochlorothiazide (HYDRODIURIL) 12.5 MG tablet Take 1 tablet (12.5 mg total) by mouth daily. 12/17/19   Virginia Crews, MD  Lancets MISC Test blood sugar daily. Dx code: E11.9 02/05/16   Wardell Honour, MD  lisinopril (ZESTRIL) 40 MG tablet Take 1 tablet (40 mg total) by mouth daily. 05/06/20   Virginia Crews, MD  lovastatin (MEVACOR) 40 MG tablet Take 1 tablet (40 mg total) by mouth at bedtime. 02/17/20   Virginia Crews, MD  sildenafil (REVATIO) 20 MG tablet TAKE 2 TO 5 TABLETS BY MOUTH 1 HOUR BEFORE SEXUAL ACTIVITY 05/24/20   Stoioff, Ronda Fairly, MD  tamsulosin (FLOMAX) 0.4 MG CAPS capsule TAKE 1 CAPSULE(0.4 MG) BY MOUTH DAILY AFTER BREAKFAST 12/18/19   Stoioff, Ronda Fairly, MD    Allergies Patient has no known allergies.  Family History  Problem Relation Age of Onset  . Hypertension Mother   . Cancer Mother        lung  . Heart attack Father   . Hypertension Father   . Hyperlipidemia Father   . Aneurysm Father 98       Brain  . Heart disease Father 2       AMI as cause of death  . Diabetes Son   . Healthy Sister     Social History Social History   Tobacco Use  . Smoking status: Former Smoker    Packs/day: 0.50    Years: 20.00    Pack years: 10.00    Types: Cigarettes    Quit date: 01/09/1986    Years since quitting: 34.4  . Smokeless tobacco: Current User    Types: Chew  . Tobacco comment: chews about 1/2 a pack of tobacco  currently;   Vaping Use  . Vaping Use: Never used  Substance Use Topics  . Alcohol use: Yes    Alcohol/week: 8.0 - 10.0 standard drinks    Types: 8 - 10 Cans of beer per week    Comment: 4-5 beers per weekend night.  DUI in 2000.  . Drug use: No    Review of Systems Constitutional: No fever/chills Eyes: No visual changes. Cardiovascular: Denies chest pain. Respiratory: Denies shortness of breath. Gastrointestinal: No abdominal pain.  No nausea, no vomiting.  Musculoskeletal: Negative for musculoskeletal pain. Skin: Positive for laceration. Neurological: Negative for  focal weakness or numbness.  ____________________________________________   PHYSICAL EXAM:  VITAL SIGNS: ED Triage Vitals [  06/16/20 1122]  Enc Vitals Group     BP      Pulse      Resp      Temp      Temp src      SpO2      Weight 180 lb (81.6 kg)     Height 6' (1.829 m)     Head Circumference      Peak Flow      Pain Score 0     Pain Loc      Pain Edu?      Excl. in [place]?     Constitutional: Alert and oriented. Well appearing and in no acute distress. Eyes: Conjunctivae are normal.  Head: Atraumatic. Neck: No stridor.   Cardiovascular: Normal rate, regular rhythm. Grossly normal heart sounds.  Good peripheral circulation. Respiratory: Normal respiratory effort.  No retractions. Lungs CTAB. Gastrointestinal: Soft and nontender. No distention.  Musculoskeletal: Examination of the left hand volar aspect there is a single linear laceration to the hyper eminence with bleeding controlled.  Patient is able to move digits without any difficulty.  Motor sensory function intact.  Capillary refills less than 3 seconds.  Bleeding is controlled with pressure. Neurologic:  Normal speech and language. No gross focal neurologic deficits are appreciated. No gait instability. Skin:  Skin is warm, dry and intact.  Laceration as noted above. Psychiatric: Mood and affect are normal. Speech and behavior are  normal.  ____________________________________________   LABS (all labs ordered are listed, but only abnormal results are displayed)  Labs Reviewed - No data to display   PROCEDURES  Procedure(s) performed (including Critical Care):  Marland KitchenMarland KitchenLaceration Repair  Date/Time: 06/16/2020 1:55 PM Performed by: Johnn Hai, PA-C Authorized by: Johnn Hai, PA-C   Consent:    Consent obtained:  Verbal   Consent given by:  Patient   Risks discussed:  Infection, pain and poor wound healing Universal protocol:    Patient identity confirmed:  Verbally with patient Anesthesia:    Anesthesia method:  Local infiltration   Local anesthetic:  Lidocaine 2% WITH epi Laceration details:    Location:  Hand   Hand location:  L palm   Length (cm):  4.5 Exploration:    Limited defect created (wound extended): no     Hemostasis achieved with:  Epinephrine and direct pressure   Wound exploration: wound explored through full range of motion and entire depth of wound visualized     Contaminated: no   Treatment:    Area cleansed with:  Saline   Amount of cleaning:  Standard   Irrigation solution:  Sterile saline   Irrigation volume:  120 mL   Irrigation method:  Syringe   Visualized foreign bodies/material removed: no     Debridement:  None   Undermining:  None Skin repair:    Repair method:  Sutures   Suture size:  4-0   Suture material:  Nylon   Suture technique:  Simple interrupted   Number of sutures:  9 Approximation:    Approximation:  Close Repair type:    Repair type:  Simple Post-procedure details:    Dressing:  Non-adherent dressing   Procedure completion:  Tolerated well, no immediate complications     ____________________________________________   INITIAL IMPRESSION / ASSESSMENT AND PLAN / ED COURSE  As part of my medical decision making, I reviewed the following data within the electronic MEDICAL RECORD NUMBER Notes from prior ED visits and [place] Controlled Substance  Database  80 year old  male presents to the ED with laceration to his left hand that occurred while he was using a knife.  Bleeding was controlled with pressure and wound was irrigated and no foreign body or debris was present.  Patient tolerated procedure well without any complications.  A prescription for hydrocodone was sent to his pharmacy in the event that he needs something for pain.  He is encouraged to elevate his hand to discourage swelling.  He is to return to the emergency department if any sudden changes or urgent concerns.  He also was made aware that he should clean this area daily with mild soap and water and allowed to dry completely before applying another dressing.  He is to watch for any signs of infection and may follow-up with his PCP or urgent care.  He also was given instructions to have sutures removed in 10 to 14 days.  ____________________________________________   FINAL CLINICAL IMPRESSION(S) / ED DIAGNOSES  Final diagnoses:  Laceration of left hand without foreign body, initial encounter     ED Discharge Orders         Ordered    HYDROcodone-acetaminophen (NORCO/VICODIN) 5-325 MG tablet  Every 6 hours PRN        06/16/20 1403           Note:  This document was prepared using Dragon voice recognition software and may include unintentional dictation errors.    Johnn Hai, PA-C 06/16/20 1524    Duffy Bruce, MD 06/16/20 [phone]

## 2020-06-16 NOTE — Discharge Instructions (Addendum)
Follow-up with your primary care provider or urgent care for suture removal in 10 to 14 days.  Elevate hand as needed for swelling which will reduce pain.  A prescription for hydrocodone with Tylenol was sent to the pharmacy.  This medication is narcotic and cause drowsiness.  Do not drive or operate any equipment while taking this medication.  Clean area daily with mild soap and water and allowed to dry completely before covering it.  If any signs of infection such as pus, fever, redness at the site return to the emergency department or urgent care for evaluation.  You may see some oozing from your laceration this evening which is not unusual.

## 2020-06-22 ENCOUNTER — Other Ambulatory Visit: Payer: Self-pay

## 2020-06-22 ENCOUNTER — Telehealth: Payer: Self-pay

## 2020-06-22 MED ORDER — LISINOPRIL 40 MG PO TABS: 40.0000 mg | ORAL_TABLET | Freq: Every day | ORAL | 3 refills | Status: DC

## 2020-06-22 NOTE — Telephone Encounter (Signed)
[place] faxed refill request for the following medications:  lisinopril (ZESTRIL) 20 MG tablet  This strength is not on current medication list   Please advise.

## 2020-07-20 ENCOUNTER — Ambulatory Visit: Payer: Self-pay | Admitting: Family Medicine

## 2020-07-21 ENCOUNTER — Telehealth: Payer: Self-pay

## 2020-07-21 ENCOUNTER — Other Ambulatory Visit: Payer: Self-pay | Admitting: Urology

## 2020-07-21 NOTE — Telephone Encounter (Signed)
Copied from [place] [id number]. Topic: Appointment Scheduling - Scheduling Inquiry for Clinic >> Jul 21, 2020  1:33 PM Lennox Solders wrote: reason for CRM: [facility] and had stitches put in left hand and needed to have them removed 10-14 days. Pt is calling today and would like an appt to have stitches removed

## 2020-07-22 ENCOUNTER — Ambulatory Visit (INDEPENDENT_AMBULATORY_CARE_PROVIDER_SITE_OTHER): Payer: Medicare PPO | Admitting: Family Medicine

## 2020-07-22 ENCOUNTER — Encounter: Payer: Self-pay | Admitting: Family Medicine

## 2020-07-22 ENCOUNTER — Other Ambulatory Visit: Payer: Self-pay

## 2020-07-22 VITALS — BP 125/88 | HR 102 | Temp 99.1°F | Wt 179.0 lb

## 2020-07-22 DIAGNOSIS — S61412D Laceration without foreign body of left hand, subsequent encounter: Secondary | ICD-10-CM

## 2020-07-22 NOTE — Telephone Encounter (Signed)
Appt made for 07/22/2020 at 240 with Simona Huh

## 2020-07-22 NOTE — Progress Notes (Signed)
Acute Office Visit  Subjective:    Patient ID: Todd Diaz, male    DOB: 17-Feb-1940, 80 y.o.   MRN: [id number]  No chief complaint on file.   HPI Patient is in today for suture removal.  Patient states he cut his left palm when laying carpet on 06/16/20.  When asked why he waited so long to come in for suture removal he said he was too busy. He denies having any signs of infection or other problems with the site.  He was given Tdap at the ER.    Past Medical History:  Diagnosis Date   Arthritis    Knees   Avascular necrosis of medial femoral condyle ([facility])    Chronic prostatitis    Diabetes mellitus ([place])    controlled with medication   Erectile dysfunction    Hearing loss    Hyperlipidemia    Hypertension    controlled with medication   Hypertrophy of prostate without urinary obstruction and other lower urinary tract symptoms (LUTS)    Infection of urinary tract    ASSESSMENT:Recurrent MRSA UTI F/B Dr. Clayborn Bigness and Dr. Jacqlyn Larsen   TIA (transient ischemic attack) 02/23/2014   R sided weakness and numbness; duration 1 hour; evaluated [facility].   Tobacco use disorder     Past Surgical History:  Procedure Laterality Date   Admission  03/09/2012   [place]; 24 hour admission; LE numbness/tingling.  CT head, MRI brain, carotid dopplers, 2D-echo.   AMPUTATION  2009   L fifth finger surgery for near amputation. [place].   BLADDER STONE REMOVAL     EYE SURGERY  08/09/2013   B cataract surgery; [place]  04/09/2013   3 repaired; [place].   JOINT REPLACEMENT     PARTIAL KNEE ARTHROPLASTY  07/24/2011   Procedure: UNICOMPARTMENTAL KNEE;  Surgeon: Lorn Junes, MD;  Location: [place];  Service: Orthopedics;  Laterality: Left;   PROSTATE SURGERY     RETINAL TEAR REPAIR CRYOTHERAPY  08/09/2013   [name]    Family History  Problem Relation Age of Onset   Hypertension Mother    Cancer Mother        lung   Heart attack Father    Hypertension Father    Hyperlipidemia Father     Aneurysm Father 32       Brain   Heart disease Father 59       AMI as cause of death   Diabetes Son    Healthy Sister     Social History   Socioeconomic History   Marital status: Widowed    Spouse name: Not on file   Number of children: 4   Years of education: 12   Highest education level: High school graduate  Occupational History   Occupation: Engineer, agricultural  Tobacco Use   Smoking status: Former    Packs/day: 0.50    Years: 20.00    Pack years: 10.00    Types: Cigarettes    Quit date: 01/09/1986    Years since quitting: 34.5   Smokeless tobacco: Current    Types: Chew   Tobacco comments:    chews about 1/2 a pack of tobacco currently;   Vaping Use   Vaping Use: Never used  Substance and Sexual Activity   Alcohol use: Yes    Alcohol/week: 8.0 - 10.0 standard drinks    Types: 8 - 10 Cans of beer per week    Comment: 4-5 beers per weekend night.  DUI  in 2000.   Drug use: No   Sexual activity: Not Currently    Partners: Female  Other Topics Concern   Not on file  Social History Narrative   Widowed since 02/2009 after 75 years second marriage       Children: 6 children, 2 stepchildren; 8 grandchildren, 2 gg.      Lives: with cat, dog.      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      Tobacco: quit smoking age 33; smoked x 22 years.      Alcohol:  Beer every weekend (5-6 beers per night on weekends).      Seatbelt:  Always uses seat belts, Smoke alarm in the home,      Guns: >5 guns in the home loaded and locked up.      Caffeine KAJ:GOTLXB, Tea, Carbonated beverages; consumes a moderate amount.      Exercise: Inactive.     Advanced Directives:  +LIVING WILL; FULL CODE, No HCPOA.      ADLs: independent with ADLs.  Drives.   Social Determinants of Health   Financial Resource Strain: Low Risk    Difficulty of Paying Living Expenses: Not hard at all  Food Insecurity: No Food Insecurity   Worried About Charity fundraiser in the Last Year: Never true    [place] in the Last Year: Never true  Transportation Needs: No Transportation Needs   Lack of Transportation (Medical): No   Lack of Transportation (Non-Medical): No  Physical Activity: Inactive   Days of Exercise per Week: 0 days   Minutes of Exercise per Session: 0 min  Stress: No Stress Concern Present   Feeling of Stress : Not at all  Social Connections: Socially Isolated   Frequency of Communication with Friends and Family: More than three times a week   Frequency of Social Gatherings with Friends and Family: More than three times a week   Attends Religious Services: Never   Marine scientist or Organizations: No   Attends Archivist Meetings: Never   Marital Status: Widowed  Human resources officer Violence: Not At Risk   Fear of Current or Ex-Partner: No   Emotionally Abused: No   Physically Abused: No   Sexually Abused: No    Outpatient Medications Prior to Visit  Medication Sig Dispense Refill   aspirin EC 81 MG tablet Take 81 mg by mouth daily.     Blood Glucose Calibration (ACCU-CHEK COMPACT PLUS CONTROL) SOLN Test blood sugar daily. Dx code: E11.9 1 each 0   blood glucose meter kit and supplies KIT Dispense based on patient and insurance preference. Check sugar once daily as directed. (FOR ICD-9 250.00, 250.01). 1 each 0   Blood Glucose Monitoring Suppl (BLOOD GLUCOSE METER KIT AND SUPPLIES) KIT Test blood sugar daily. Dx code: E11.9 1 each 0   clopidogrel (PLAVIX) 75 MG tablet TAKE 1 TABLET(75 MG) BY MOUTH DAILY 90 tablet 1   gabapentin (NEURONTIN) 300 MG capsule TAKE 1 CAPSULE(300 MG) BY MOUTH AT BEDTIME 90 capsule 3   glucose blood test strip Test blood sugar daily. Dx Code: E11.9 100 each 3   hydrochlorothiazide (HYDRODIURIL) 12.5 MG tablet Take 1 tablet (12.5 mg total) by mouth daily. 90 tablet 3   HYDROcodone-acetaminophen (NORCO/VICODIN) 5-325 MG tablet Take 1 tablet by mouth every 6 (six) hours as needed for moderate pain. 12 tablet 0   Lancets  MISC Test blood sugar daily. Dx code: E11.9 100 each  3   lisinopril (ZESTRIL) 40 MG tablet Take 1 tablet (40 mg total) by mouth daily. 90 tablet 3   lovastatin (MEVACOR) 40 MG tablet Take 1 tablet (40 mg total) by mouth at bedtime. 90 tablet 3   sildenafil (REVATIO) 20 MG tablet TAKE 2 TO 5 TABLETS BY MOUTH 1 HOUR BEFORE SEXUAL ACTIVITY 30 tablet 3   tamsulosin (FLOMAX) 0.4 MG CAPS capsule TAKE 1 CAPSULE(0.4 MG) BY MOUTH DAILY AFTER BREAKFAST 90 capsule 3   No facility-administered medications prior to visit.    No Known Allergies  Review of Systems  Skin:  Positive for wound (laceration left palm, repaired).      Objective:    Physical Exam Constitutional:      General: He is not in acute distress.    Appearance: He is well-developed.  HENT:     Head: Normocephalic and atraumatic.     Right Ear: Hearing normal.     Left Ear: Hearing normal.     Nose: Nose normal.  Eyes:     General: Lids are normal. No scleral icterus.       Right eye: No discharge.        Left eye: No discharge.     Conjunctiva/sclera: Conjunctivae normal.  Pulmonary:     Effort: Pulmonary effort is normal. No respiratory distress.  Musculoskeletal:        General: Deformity present. Normal range of motion.     Comments: Arthritic deformities of the DIP joints of index and middle fingers of both hands. Approximately 4 cm laceration left palmar thenar prominence with   Skin:    Findings: No lesion or rash.  Neurological:     Mental Status: He is alert and oriented to person, place, and time.  Psychiatric:        Speech: Speech normal.        Behavior: Behavior normal.        Thought Content: Thought content normal.    BP 125/88 (BP Location: Right Arm, Patient Position: Sitting, Cuff Size: Normal)   Pulse (!) 102   Temp 99.1 F (37.3 C) (Oral)   Wt 179 lb (81.2 kg)   SpO2 94%   BMI 24.28 kg/m  Wt Readings from Last 3 Encounters:  07/22/20 179 lb (81.2 kg)  06/16/20 180 lb (81.6 kg)  05/06/20  184 lb (83.5 kg)    Health Maintenance Due  Topic Date Due   Zoster Vaccines- Shingrix (1 of 2) Never done   OPHTHALMOLOGY EXAM  07/15/2015   COVID-19 Vaccine (3 - Booster for Pfizer series) 04/05/2020    There are no preventive care reminders to display for this patient.   Lab Results  Component Value Date   TSH 0.91 06/15/2015   Lab Results  Component Value Date   WBC 5.2 06/18/2019   HGB 16.3 06/18/2019   HCT 47.4 06/18/2019   MCV 90 06/18/2019   PLT 204 06/18/2019   Lab Results  Component Value Date   NA 141 05/06/2020   K 4.3 05/06/2020   CO2 27 05/06/2020   GLUCOSE 121 (H) 05/06/2020   BUN 9 05/06/2020   CREATININE 0.92 05/06/2020   BILITOT 0.3 10/06/2019   ALKPHOS 99 10/06/2019   AST 22 10/06/2019   ALT 17 10/06/2019   PROT 6.6 10/06/2019   ALBUMIN 4.0 10/06/2019   CALCIUM 9.8 05/06/2020   ANIONGAP 8 09/09/2015   EGFR 84 05/06/2020   Lab Results  Component Value Date   CHOL 148 10/06/2019  Lab Results  Component Value Date   HDL 34 (L) 10/06/2019   Lab Results  Component Value Date   LDLCALC 77 10/06/2019   Lab Results  Component Value Date   TRIG 223 (H) 10/06/2019   Lab Results  Component Value Date   CHOLHDL 4.4 10/06/2019   Lab Results  Component Value Date   HGBA1C 5.8 (A) 05/06/2020       Assessment & Plan:   1. Laceration of left palm, subsequent encounter Laceration to the left hand thenar eminence sustained using a knife to cut carpet on the job on 06-16-20. Had it sutured in the ER, but, did not seek follow up as recommended until today. 8 interrupted sutures and 1 mattress suture removed without difficulty today. Denies neurologic deficits and no sign of infection. Recheck prn.   Juluis Mire, CMA  I, Vernie Murders, PA-C, have reviewed all documentation for this visit. The documentation on 07/22/20 for the exam, diagnosis, procedures, and orders are all accurate and complete.

## 2020-08-02 ENCOUNTER — Telehealth: Payer: Self-pay | Admitting: Family Medicine

## 2020-08-02 ENCOUNTER — Ambulatory Visit: Payer: Self-pay

## 2020-08-02 NOTE — Telephone Encounter (Signed)
Pt c/o recurrence of panic attacks and insomnia. Sx began 3 weeks ago. Pt stated that as night comes on, his anxiety become severe. He starts sweating and starts breathing faster. Pt drinks 1-2 beers per day, does not smoke and drinks 1 cup of coffee per day.  Pt stated he is a widower for past 5 years but stated he has a good support system, especially with his daughter.  Pt stated that he sleeps 1 hour per night. And Feels "stomach burning." Denies taking any illicit drug use. Pt took aspirin and Aleve but stated it is not helping.  Pt sated he is not having sx during the day. Pt was treated for this condition before.  Per chart review was prescribed diazepam 5 mg  1 tablet every 8 hours for anxiety. No suicidal or homicidal ideation. Pt has upcoming appointment.  Pt is requesting a short term refill of Valium to help him with his anxiety. Please call to [facility].

## 2020-08-02 NOTE — Telephone Encounter (Signed)
Patient requesting a medication for panic attacks which happen at more so at night, he has been experiencing panic attacks for 1 month. Patient scheduled a follow up with PCP but would like PCP to prescribe him a short supply prior to Thursday appointment.    [facility] [place], [facility] Phone:  [phone]  Fax:  [phone]

## 2020-08-02 NOTE — Telephone Encounter (Signed)
Reason for Disposition  Symptoms interfere with sleep  Answer Assessment - Initial Assessment Questions 1. CONCERN: "Did anything happen that prompted you to call today?"      Panic attacks every night insomnia 2. ANXIETY SYMPTOMS: "Can you describe how you (your loved one; patient) have been feeling?" (e.g., tense, restless, panicky, anxious, keyed up, overwhelmed, sense of impending doom).      Panicky, nervous 3. ONSET: "How long have you been feeling this way?" (e.g., hours, days, weeks)     3 weeks 4. SEVERITY: "How would you rate the level of anxiety?" (e.g., 0 - 10; or mild, moderate, severe).     severe 5. FUNCTIONAL IMPAIRMENT: "How have these feelings affected your ability to do daily activities?" "Have you had more difficulty than usual doing your normal daily activities?" (e.g., getting better, same, worse; self-care, school, work, interactions)     No isssue during day light hours 6. HISTORY: "Have you felt this way before?" "Have you ever been diagnosed with an anxiety problem in the past?" (e.g., generalized anxiety disorder, panic attacks, PTSD). If Yes, ask: "How was this problem treated?" (e.g., medicines, counseling, etc.)     Yes several- took pill at night 7. RISK OF HARM - SUICIDAL IDEATION: "Do you ever have thoughts of hurting or killing yourself?" If Yes, ask:  "Do you have these feelings now?" "Do you have a plan on how you would do this?"     no 8. TREATMENT:  "What has been done so far to treat this anxiety?" (e.g., medicines, relaxation strategies). "What has helped?"     Taking aspirin Aleve not helpful 9. TREATMENT - THERAPIST: "Do you have a counselor or therapist? Name?"     no 10. POTENTIAL TRIGGERS: "Do you drink caffeinated beverages (e.g., coffee, colas, teas), and how much daily?" "Do you drink alcohol or use any drugs?" "Have you started any new medicines recently?"     1 cup per day/ drinks 1 - 2 beers per day-no 10. PATIENT SUPPORT: "Who is with you  now?" "Who do you live with?" "Do you have family or friends who you can talk to?"        Widower for past 5 years- has a good support system 11. OTHER SYMPTOMS: "Do you have any other symptoms?" (e.g., feeling depressed, trouble concentrating, trouble sleeping, trouble breathing, palpitations or fast heartbeat, chest pain, sweating, nausea, or diarrhea)       Sleeps 1 hour a night. Stomach burning worse over last 2 weeks Sweating and breathing faster  Protocols used: Anxiety and Panic Attack-A-AH

## 2020-08-03 NOTE — Telephone Encounter (Signed)
Will need to discuss at his appt on Thursday.

## 2020-08-03 NOTE — Telephone Encounter (Signed)
Noted  

## 2020-08-05 ENCOUNTER — Other Ambulatory Visit: Payer: Self-pay

## 2020-08-05 ENCOUNTER — Encounter: Payer: Self-pay | Admitting: Family Medicine

## 2020-08-05 ENCOUNTER — Ambulatory Visit: Payer: Medicare PPO | Admitting: Family Medicine

## 2020-08-05 VITALS — BP 121/76 | HR 93 | Temp 98.7°F | Resp 16 | Wt 180.2 lb

## 2020-08-05 DIAGNOSIS — F411 Generalized anxiety disorder: Secondary | ICD-10-CM | POA: Diagnosis not present

## 2020-08-05 DIAGNOSIS — F410 Panic disorder [episodic paroxysmal anxiety] without agoraphobia: Secondary | ICD-10-CM | POA: Diagnosis not present

## 2020-08-05 MED ORDER — DIAZEPAM 5 MG PO TABS: 2.5000 mg | ORAL_TABLET | Freq: Two times a day (BID) | ORAL | 0 refills | Status: DC | PRN

## 2020-08-05 MED ORDER — SERTRALINE HCL 50 MG PO TABS: 50.0000 mg | ORAL_TABLET | Freq: Every day | ORAL | 3 refills | Status: DC

## 2020-08-05 NOTE — Assessment & Plan Note (Addendum)
-   Poorly controlled - Start Sertraline 50 mg daily - Continue Valium 5 mg prn for panic attacks - RTC in 3 months for re-eval

## 2020-08-05 NOTE — Progress Notes (Signed)
Established patient visit   Patient: Todd Diaz   DOB: August 13, 1940   80 y.o. Male  MRN: [id number] Visit Date: 08/05/2020  Today's healthcare provider: Lavon Paganini, MD   Chief Complaint  Patient presents with   Panic Attack    Patient presents in office today to address concerns of anxiety and recent panic attacks that has been occurring of the past two weeks or more. Patient reports that he has difficulty mainly at night time when he has to fall to sleep. Patient reports history of anxiety and states that he has previously taken Valium 39m in past.      Subjective    HPI  Generalized Anxiety Disorder - Pt. reports frequent episodes of anxiety during past 2 weeks that keep him from sleeping at night, approx every other night - He denies chest tightness, SOB, lightheadedness during these episodes; he denies specific phobias, nightmares, flashbacks - Pt. was diagnosed w/ panic disorder in 2019 and describes these recent episodes as panic attacks - Was previously treated w/ prn Valium, which he felt was effective; pt. states that he has never tried any other anti-anxiety or anti-depressant medications   Medications: Outpatient Medications Prior to Visit  Medication Sig   aspirin EC 81 MG tablet Take 81 mg by mouth daily.   Blood Glucose Calibration (ACCU-CHEK COMPACT PLUS CONTROL) SOLN Test blood sugar daily. Dx code: E11.9   blood glucose meter kit and supplies KIT Dispense based on patient and insurance preference. Check sugar once daily as directed. (FOR ICD-9 250.00, 250.01).   Blood Glucose Monitoring Suppl (BLOOD GLUCOSE METER KIT AND SUPPLIES) KIT Test blood sugar daily. Dx code: E11.9   clopidogrel (PLAVIX) 75 MG tablet TAKE 1 TABLET(75 MG) BY MOUTH DAILY   gabapentin (NEURONTIN) 300 MG capsule TAKE 1 CAPSULE(300 MG) BY MOUTH AT BEDTIME   glucose blood test strip Test blood sugar daily. Dx Code: E11.9   hydrochlorothiazide (HYDRODIURIL) 12.5 MG tablet Take 1  tablet (12.5 mg total) by mouth daily.   HYDROcodone-acetaminophen (NORCO/VICODIN) 5-325 MG tablet Take 1 tablet by mouth every 6 (six) hours as needed for moderate pain.   Lancets MISC Test blood sugar daily. Dx code: E11.9   lisinopril (ZESTRIL) 40 MG tablet Take 1 tablet (40 mg total) by mouth daily.   lovastatin (MEVACOR) 40 MG tablet Take 1 tablet (40 mg total) by mouth at bedtime.   sildenafil (REVATIO) 20 MG tablet TAKE 2 TO 5 TABLETS BY MOUTH 1 HOUR BEFORE SEXUAL ACTIVITY   tamsulosin (FLOMAX) 0.4 MG CAPS capsule TAKE 1 CAPSULE(0.4 MG) BY MOUTH DAILY AFTER BREAKFAST   No facility-administered medications prior to visit.    Review of Systems  Constitutional: Negative.  Negative for chills and fever.  HENT: Negative.    Eyes: Negative.   Respiratory: Negative.  Negative for chest tightness and shortness of breath.   Cardiovascular: Negative.  Negative for chest pain and leg swelling.  Gastrointestinal: Negative.   Endocrine: Negative.   Genitourinary: Negative.   Musculoskeletal: Negative.   Skin: Negative.   Neurological: Negative.   Psychiatric/Behavioral:  The patient is nervous/anxious.       Objective    BP 121/76   Pulse 93   Temp 98.7 F (37.1 C) (Oral)   Resp 16   Wt 180 lb 3.2 oz (81.7 kg)   SpO2 98%   BMI 24.44 kg/m     Physical Exam Constitutional:      General: He is not in acute  distress.    Appearance: Normal appearance. He is normal weight.  HENT:     Head: Normocephalic and atraumatic.     Right Ear: External ear normal.     Left Ear: External ear normal.     Nose: Nose normal.  Eyes:     Conjunctiva/sclera: Conjunctivae normal.  Cardiovascular:     Rate and Rhythm: Normal rate and regular rhythm.     Pulses: Normal pulses.     Heart sounds: Normal heart sounds.  Pulmonary:     Effort: Pulmonary effort is normal.     Breath sounds: Normal breath sounds.  Abdominal:     General: Abdomen is flat. Bowel sounds are normal.     Palpations:  Abdomen is soft.  Skin:    General: Skin is warm and dry.  Neurological:     Mental Status: He is alert.  Psychiatric:        Thought Content: Thought content normal.     No results found for any visits on 08/05/20.  Assessment & Plan     Problem List Items Addressed This Visit       Other   Panic attack - Primary   Relevant Medications   sertraline (ZOLOFT) 50 MG tablet   diazepam (VALIUM) 5 MG tablet   Generalized anxiety disorder with panic attacks    - Poorly controlled - Start Sertraline 50 mg daily - Continue Valium 5 mg prn for panic attacks - RTC in 3 months for re-eval       Relevant Medications   sertraline (ZOLOFT) 50 MG tablet   diazepam (VALIUM) 5 MG tablet     Return in about 3 months (around 11/05/2020) for as scheduled.      Percell Locus, MS3  Patient seen along with MS3 student Percell Locus. I personally evaluated this patient along with the student, and verified all aspects of the history, physical exam, and medical decision making as documented by the student. I agree with the student's documentation and have made all necessary edits.  Maley Venezia, Dionne Bucy, MD, MPH [facility]

## 2020-08-30 ENCOUNTER — Telehealth: Payer: Self-pay | Admitting: Family Medicine

## 2020-08-30 MED ORDER — CLOPIDOGREL BISULFATE 75 MG PO TABS: ORAL_TABLET | ORAL | 1 refills | Status: DC

## 2020-08-30 NOTE — Telephone Encounter (Signed)
[facility] faxed refill request for the following medications:   clopidogrel (PLAVIX) 75 MG tablet    Please advise.  

## 2020-09-21 ENCOUNTER — Other Ambulatory Visit: Payer: Self-pay | Admitting: Family Medicine

## 2020-10-26 ENCOUNTER — Other Ambulatory Visit: Payer: Self-pay | Admitting: Family Medicine

## 2020-10-26 NOTE — Telephone Encounter (Signed)
Requested medication (s) are due for refill today - yes  Requested medication (s) are on the active medication list -yes  Future visit scheduled -yes  Last refill: 08/05/20 #5  Notes to clinic: Request RF: non delegated Rx   Requested Prescriptions  Pending Prescriptions Disp Refills   diazepam (VALIUM) 5 MG tablet [Pharmacy Med Name: DIAZEPAM 5MG  TABLETS] 5 tablet     Sig: TAKE 1/2 TO 1 TABLET(2.5 TO 5 MG) BY MOUTH EVERY 12 HOURS AS NEEDED FOR ANXIETY     Not Delegated - Psychiatry:  Anxiolytics/Hypnotics Failed - 10/26/2020 10:30 AM      Failed - This refill cannot be delegated      Failed - Urine Drug Screen completed in last 360 days      Passed - Valid encounter within last 6 months    Recent Outpatient Visits           2 months ago Panic disorder   [facility] [place], Dionne Bucy, MD   3 months ago Laceration of left palm, subsequent encounter   [place], Vickki Muff, PA-C   5 months ago Type 2 diabetes mellitus with other specified complication, without long-term current use of insulin ([place])   [place], Dionne Bucy, MD   11 months ago Encounter for annual physical exam   [facility] [place], Dionne Bucy, MD   1 year ago Type 2 diabetes mellitus with other specified complication, without long-term current use of insulin [facility])   [facility], Dionne Bucy, MD       Future Appointments             In 1 month [place], Ronda Fairly, MD [place]               Requested Prescriptions  Pending Prescriptions Disp Refills   diazepam (VALIUM) 5 MG tablet [Pharmacy Med Name: DIAZEPAM 5MG  TABLETS] 5 tablet     Sig: TAKE 1/2 TO 1 TABLET(2.5 TO 5 MG) BY MOUTH EVERY 12 HOURS AS NEEDED FOR ANXIETY     Not Delegated - Psychiatry:  Anxiolytics/Hypnotics Failed - 10/26/2020 10:30 AM      Failed - This refill cannot be delegated      Failed - Urine Drug  Screen completed in last 360 days      Passed - Valid encounter within last 6 months    Recent Outpatient Visits           2 months ago Panic disorder   [facility] [place], Dionne Bucy, MD   3 months ago Laceration of left palm, subsequent encounter   [place], Vickki Muff, PA-C   5 months ago Type 2 diabetes mellitus with other specified complication, without long-term current use of insulin [facility])   [facility], Dionne Bucy, MD   11 months ago Encounter for annual physical exam   [facility] [place], Dionne Bucy, MD   1 year ago Type 2 diabetes mellitus with other specified complication, without long-term current use of insulin [facility])   [place], Dionne Bucy, MD       Future Appointments             In 1 month [place], Ronda Fairly, MD [place]

## 2020-11-08 ENCOUNTER — Other Ambulatory Visit: Payer: Self-pay | Admitting: Urology

## 2020-11-11 ENCOUNTER — Ambulatory Visit: Payer: Medicare PPO | Admitting: Family Medicine

## 2020-11-11 NOTE — Progress Notes (Deleted)
Annual Wellness Visit     Patient: Todd Diaz, Male    DOB: 1940/12/05, 80 y.o.   MRN: [id number] Visit Date: 11/11/2020  Today's Provider: Lavon Paganini, MD   No chief complaint on file.  Subjective    Todd Diaz is a 80 y.o. male who presents today for his Annual Wellness Visit. He reports consuming a {diet types:17450} diet. {Exercise:19826} He generally feels {well/fairly well/poorly:18703}. He reports sleeping {well/fairly well/poorly:18703}. He {does/does not:200015} have additional problems to discuss today.   HPI    Medications: Outpatient Medications Prior to Visit  Medication Sig   aspirin EC 81 MG tablet Take 81 mg by mouth daily.   Blood Glucose Calibration (ACCU-CHEK COMPACT PLUS CONTROL) SOLN Test blood sugar daily. Dx code: E11.9   blood glucose meter kit and supplies KIT Dispense based on patient and insurance preference. Check sugar once daily as directed. (FOR ICD-9 250.00, 250.01).   Blood Glucose Monitoring Suppl (BLOOD GLUCOSE METER KIT AND SUPPLIES) KIT Test blood sugar daily. Dx code: E11.9   clopidogrel (PLAVIX) 75 MG tablet TAKE 1 TABLET(75 MG) BY MOUTH DAILY   diazepam (VALIUM) 5 MG tablet TAKE 1/2 TO 1 TABLET(2.5 TO 5 MG) BY MOUTH EVERY 12 HOURS AS NEEDED FOR ANXIETY   gabapentin (NEURONTIN) 300 MG capsule TAKE 1 CAPSULE(300 MG) BY MOUTH AT BEDTIME   glucose blood test strip Test blood sugar daily. Dx Code: E11.9   hydrochlorothiazide (HYDRODIURIL) 12.5 MG tablet Take 1 tablet (12.5 mg total) by mouth daily.   HYDROcodone-acetaminophen (NORCO/VICODIN) 5-325 MG tablet Take 1 tablet by mouth every 6 (six) hours as needed for moderate pain.   Lancets MISC Test blood sugar daily. Dx code: E11.9   lisinopril (ZESTRIL) 40 MG tablet Take 1 tablet (40 mg total) by mouth daily.   lovastatin (MEVACOR) 40 MG tablet Take 1 tablet (40 mg total) by mouth at bedtime.   sertraline (ZOLOFT) 50 MG tablet Take 1 tablet (50 mg total) by mouth daily.    sildenafil (REVATIO) 20 MG tablet TAKE 2 TO 5 TABLETS BY MOUTH 1 HOUR BEFORE SEXUAL ACTIVITY   tamsulosin (FLOMAX) 0.4 MG CAPS capsule TAKE 1 CAPSULE(0.4 MG) BY MOUTH DAILY AFTER BREAKFAST   No facility-administered medications prior to visit.    No Known Allergies  Patient Care Team: Virginia Crews, MD as PCP - General (Family Medicine) Pieter Partridge, DO as Consulting Physician (Neurology) [name], MD as Consulting Physician (Urology) Elsie Saas, MD as Consulting Physician (Orthopedic Surgery) Anell Barr, OD as Consulting Physician (Optometry)  Review of Systems  {Labs  Heme  Chem  Endocrine  Serology  Results Review (optional):23779}    Objective    Vitals: There were no vitals taken for this visit. {Show previous vital signs (optional):23777}  Physical Exam ***  Most recent functional status assessment: In your present state of health, do you have any difficulty performing the following activities: 07/22/2020  Hearing? Y  Vision? N  Difficulty concentrating or making decisions? N  Walking or climbing stairs? N  Dressing or bathing? N  Doing errands, shopping? N  Some recent data might be hidden   Most recent fall risk assessment: Fall Risk  07/22/2020  Falls in the past year? 0  Number falls in past yr: 0  Injury with Fall? 0  Risk for fall due to : -  Follow up -    Most recent depression screenings: PHQ 2/9 Scores 08/05/2020 07/22/2020  PHQ - 2  Score 2 0  PHQ- 9 Score 4 0   Most recent cognitive screening: 6CIT Screen 07/11/2017  What Year? 0 points  What month? 0 points  What time? 0 points  Count back from 20 0 points  Months in reverse 2 points  Repeat phrase 4 points  Total Score 6   Most recent Audit-C alcohol use screening Alcohol Use Disorder Test (AUDIT) 07/22/2020  1. How often do you have a drink containing alcohol? 3  2. How many drinks containing alcohol do you have on a typical day when you are drinking? 1  3. How  often do you have six or more drinks on one occasion? 0  AUDIT-C Score 4  4. How often during the last year have you found that you were not able to stop drinking once you had started? -  5. How often during the last year have you failed to do what was normally expected from you because of drinking? -  6. How often during the last year have you needed a first drink in the morning to get yourself going after a heavy drinking session? -  7. How often during the last year have you had a feeling of guilt of remorse after drinking? -  8. How often during the last year have you been unable to remember what happened the night before because you had been drinking? -  9. Have you or someone else been injured as a result of your drinking? -  10. Has a relative or friend or a doctor or another health worker been concerned about your drinking or suggested you cut down? -  Alcohol Use Disorder Identification Test Final Score (AUDIT) -  Alcohol Brief Interventions/Follow-up -   A score of 3 or more in women, and 4 or more in men indicates increased risk for alcohol abuse, EXCEPT if all of the points are from question 1   No results found for any visits on 11/11/20.  Assessment & Plan     Annual wellness visit done today including the all of the following: Reviewed patient's Family Medical History Reviewed and updated list of patient's medical providers Assessment of cognitive impairment was done Assessed patient's functional ability Established a written schedule for health screening [place] Completed and Reviewed  Exercise Activities and Dietary recommendations  Goals      Reduce alcohol intake     Recommend to cut back on alcohol intake by half (3-4 beers on the weekend).         Immunization History  Administered Date(s) Administered   DTaP 03/10/2011   Fluad Quad(high Dose 65+) 11/06/2018, 10/06/2019   Influenza Split 09/10/2010   Influenza, High Dose Seasonal PF  01/18/2017, 11/02/2017   Influenza,inj,Quad PF,6+ Mos 12/11/2012, 10/13/2013, 01/11/2015, 02/05/2016   PFIZER(Purple Top)SARS-COV-2 Vaccination 10/16/2019, 11/06/2019   Pneumococcal Conjugate-13 10/13/2013   Pneumococcal-Unspecified 03/10/2011   Tdap 06/16/2020    Health Maintenance  Topic Date Due   Zoster Vaccines- Shingrix (1 of 2) Never done   Pneumonia Vaccine 14+ Years old (2 - PPSV23 if available, else PCV20) 10/14/2014   OPHTHALMOLOGY EXAM  07/15/2015   COVID-19 Vaccine (3 - Booster for Pfizer series) 01/01/2020   INFLUENZA VACCINE  08/09/2020   FOOT EXAM  11/05/2020   HEMOGLOBIN A1C  11/05/2020   TETANUS/TDAP  06/17/2030   HPV VACCINES  Aged Out     Discussed health benefits of physical activity, and encouraged him to engage in regular exercise appropriate for his age  and condition.    ***  No follow-ups on file.     {provider attestation***:1}   Lavon Paganini, MD  [facility] [phone] (phone) [phone] (fax)  [place]

## 2020-12-01 ENCOUNTER — Ambulatory Visit (INDEPENDENT_AMBULATORY_CARE_PROVIDER_SITE_OTHER): Payer: Medicare PPO

## 2020-12-01 ENCOUNTER — Other Ambulatory Visit: Payer: Self-pay

## 2020-12-01 VITALS — BP 120/58 | HR 78 | Temp 97.1°F | Ht 73.0 in | Wt 177.6 lb

## 2020-12-01 DIAGNOSIS — Z0000 Encounter for general adult medical examination without abnormal findings: Secondary | ICD-10-CM

## 2020-12-01 DIAGNOSIS — Z23 Encounter for immunization: Secondary | ICD-10-CM

## 2020-12-01 NOTE — Patient Instructions (Signed)
[name] , Thank you for taking time to come for your Medicare Wellness Visit. I appreciate your ongoing commitment to your health goals. Please review the following plan we discussed and let me know if I can assist you in the future.   Screening recommendations/referrals: Colonoscopy: no longer needed Recommended yearly ophthalmology/optometry visit for glaucoma screening and checkup Recommended yearly dental visit for hygiene and checkup  Vaccinations: Influenza vaccine: 12/01/20 Pneumococcal vaccine: 10/13/2013 Tdap vaccine: 06/16/20 Shingles vaccine: no   Covid-19: 10/16/19, 11/06/19  Advanced directives: requested copy  Conditions/risks identified:   Next appointment: Follow up in one year for your annual wellness visit.   Preventive Care 29 Years and Older, Male Preventive care refers to lifestyle choices and visits with your health care provider that can promote health and wellness. What does preventive care include? A yearly physical exam. This is also called an annual well check. Dental exams once or twice a year. Routine eye exams. Ask your health care provider how often you should have your eyes checked. Personal lifestyle choices, including: Daily care of your teeth and gums. Regular physical activity. Eating a healthy diet. Avoiding tobacco and drug use. Limiting alcohol use. Practicing safe sex. Taking low doses of aspirin every day. Taking vitamin and mineral supplements as recommended by your health care provider. What happens during an annual well check? The services and screenings done by your health care provider during your annual well check will depend on your age, overall health, lifestyle risk factors, and family history of disease. Counseling  Your health care provider may ask you questions about your: Alcohol use. Tobacco use. Drug use. Emotional well-being. Home and relationship well-being. Sexual activity. Eating habits. History of falls. Memory  and ability to understand (cognition). Work and work Statistician. Screening  You may have the following tests or measurements: Height, weight, and BMI. Blood pressure. Lipid and cholesterol levels. These may be checked every 5 years, or more frequently if you are over 7 years old. Skin check. Lung cancer screening. You may have this screening every year starting at age 42 if you have a 30-pack-year history of smoking and currently smoke or have quit within the past 15 years. Fecal occult blood test (FOBT) of the stool. You may have this test every year starting at age 31. Flexible sigmoidoscopy or colonoscopy. You may have a sigmoidoscopy every 5 years or a colonoscopy every 10 years starting at age 54. Prostate cancer screening. Recommendations will vary depending on your family history and other risks. Hepatitis C blood test. Hepatitis B blood test. Sexually transmitted disease (STD) testing. Diabetes screening. This is done by checking your blood sugar (glucose) after you have not eaten for a while (fasting). You may have this done every 1-3 years. Abdominal aortic aneurysm (AAA) screening. You may need this if you are a current or former smoker. Osteoporosis. You may be screened starting at age 55 if you are at high risk. Talk with your health care provider about your test results, treatment options, and if necessary, the need for more tests. Vaccines  Your health care provider may recommend certain vaccines, such as: Influenza vaccine. This is recommended every year. Tetanus, diphtheria, and acellular pertussis (Tdap, Td) vaccine. You may need a Td booster every 10 years. Zoster vaccine. You may need this after age 60. Pneumococcal 13-valent conjugate (PCV13) vaccine. One dose is recommended after age 81. Pneumococcal polysaccharide (PPSV23) vaccine. One dose is recommended after age 82. Talk to your health care provider about which  screenings and vaccines you need and how often you  need them. This information is not intended to replace advice given to you by your health care provider. Make sure you discuss any questions you have with your health care provider. Document Released: 01/22/2015 Document Revised: 09/15/2015 Document Reviewed: 10/27/2014 Elsevier Interactive Patient Education  2017 [employer] Prevention in the Home Falls can cause injuries. They can happen to people of all ages. There are many things you can do to make your home safe and to help prevent falls. What can I do on the outside of my home? Regularly fix the edges of walkways and driveways and fix any cracks. Remove anything that might make you trip as you walk through a door, such as a raised step or threshold. Trim any bushes or trees on the path to your home. Use bright outdoor lighting. Clear any walking paths of anything that might make someone trip, such as rocks or tools. Regularly check to see if handrails are loose or broken. Make sure that both sides of any steps have handrails. Any raised decks and porches should have guardrails on the edges. Have any leaves, snow, or ice cleared regularly. Use sand or salt on walking paths during winter. Clean up any spills in your garage right away. This includes oil or grease spills. What can I do in the bathroom? Use night lights. Install grab bars by the toilet and in the tub and shower. Do not use towel bars as grab bars. Use non-skid mats or decals in the tub or shower. If you need to sit down in the shower, use a plastic, non-slip stool. Keep the floor dry. Clean up any water that spills on the floor as soon as it happens. Remove soap buildup in the tub or shower regularly. Attach bath mats securely with double-sided non-slip rug tape. Do not have throw rugs and other things on the floor that can make you trip. What can I do in the bedroom? Use night lights. Make sure that you have a light by your bed that is easy to reach. Do not  use any sheets or blankets that are too big for your bed. They should not hang down onto the floor. Have a firm chair that has side arms. You can use this for support while you get dressed. Do not have throw rugs and other things on the floor that can make you trip. What can I do in the kitchen? Clean up any spills right away. Avoid walking on wet floors. Keep items that you use a lot in easy-to-reach places. If you need to reach something above you, use a strong step stool that has a grab bar. Keep electrical cords out of the way. Do not use floor polish or wax that makes floors slippery. If you must use wax, use non-skid floor wax. Do not have throw rugs and other things on the floor that can make you trip. What can I do with my stairs? Do not leave any items on the stairs. Make sure that there are handrails on both sides of the stairs and use them. Fix handrails that are broken or loose. Make sure that handrails are as long as the stairways. Check any carpeting to make sure that it is firmly attached to the stairs. Fix any carpet that is loose or worn. Avoid having throw rugs at the top or bottom of the stairs. If you do have throw rugs, attach them to the floor with carpet  tape. Make sure that you have a light switch at the top of the stairs and the bottom of the stairs. If you do not have them, ask someone to add them for you. What else can I do to help prevent falls? Wear shoes that: Do not have high heels. Have rubber bottoms. Are comfortable and fit you well. Are closed at the toe. Do not wear sandals. If you use a stepladder: Make sure that it is fully opened. Do not climb a closed stepladder. Make sure that both sides of the stepladder are locked into place. Ask someone to hold it for you, if possible. Clearly mark and make sure that you can see: Any grab bars or handrails. First and last steps. Where the edge of each step is. Use tools that help you move around (mobility  aids) if they are needed. These include: Canes. Walkers. Scooters. Crutches. Turn on the lights when you go into a dark area. Replace any light bulbs as soon as they burn out. Set up your furniture so you have a clear path. Avoid moving your furniture around. If any of your floors are uneven, fix them. If there are any pets around you, be aware of where they are. Review your medicines with your doctor. Some medicines can make you feel dizzy. This can increase your chance of falling. Ask your doctor what other things that you can do to help prevent falls. This information is not intended to replace advice given to you by your health care provider. Make sure you discuss any questions you have with your health care provider. Document Released: 10/22/2008 Document Revised: 06/03/2015 Document Reviewed: 01/30/2014 Elsevier Interactive Patient Education  2017 [employer].

## 2020-12-01 NOTE — Progress Notes (Signed)
Subjective:   Todd Diaz is a 80 y.o. male who presents for Medicare Annual/Subsequent preventive examination.  Review of Systems     Cardiac Risk Factors include: advanced age (>20mn, >>19women);male gender;smoking/ tobacco exposure     Objective:    Today's Vitals   12/01/20 0816  BP: (!) 120/58  Pulse: 78  Temp: (!) 97.1 F (36.2 C)  TempSrc: Oral  SpO2: 96%  Weight: 177 lb 9.6 oz (80.6 kg)  Height: '6\' 1"'  (1.854 m)   Body mass index is 23.43 kg/m.  Advanced Directives 12/01/2020 06/16/2020 12/10/2019 11/06/2019 07/17/2018 07/11/2017 06/15/2015  Does Patient Have a Medical Advance Directive? Yes No Yes Yes Yes Yes Yes  Type of Advance Directive Living will - [place] will [place] will [place]Living will [place]Living will [place]Living will  Does patient want to make changes to medical advance directive? Yes (Inpatient - patient defers changing a medical advance directive at this time - Information given) - No - Patient declined - - - -  Copy of [place] Chart? - - - No - copy requested No - copy requested No - copy requested No - copy requested  Would patient like information on creating a medical advance directive? - - - - - - -  Pre-existing out of facility DNR order (yellow form or pink MOST form) - - - - - - -    Current Medications (verified) Outpatient Encounter Medications as of 12/01/2020  Medication Sig   aspirin EC 81 MG tablet Take 81 mg by mouth daily.   clopidogrel (PLAVIX) 75 MG tablet TAKE 1 TABLET(75 MG) BY MOUTH DAILY   gabapentin (NEURONTIN) 300 MG capsule TAKE 1 CAPSULE(300 MG) BY MOUTH AT BEDTIME   hydrochlorothiazide (HYDRODIURIL) 12.5 MG tablet Take 1 tablet (12.5 mg total) by mouth daily.   HYDROcodone-acetaminophen (NORCO/VICODIN) 5-325 MG tablet Take 1 tablet by mouth every 6 (six) hours as needed for  moderate pain.   lisinopril (ZESTRIL) 40 MG tablet Take 1 tablet (40 mg total) by mouth daily.   lovastatin (MEVACOR) 40 MG tablet Take 1 tablet (40 mg total) by mouth at bedtime.   sertraline (ZOLOFT) 50 MG tablet Take 1 tablet (50 mg total) by mouth daily.   sildenafil (REVATIO) 20 MG tablet TAKE 2 TO 5 TABLETS BY MOUTH 1 HOUR BEFORE SEXUAL ACTIVITY   tamsulosin (FLOMAX) 0.4 MG CAPS capsule TAKE 1 CAPSULE(0.4 MG) BY MOUTH DAILY AFTER BREAKFAST   Blood Glucose Calibration (ACCU-CHEK COMPACT PLUS CONTROL) SOLN Test blood sugar daily. Dx code: E11.9 (Patient not taking: Reported on 12/01/2020)   blood glucose meter kit and supplies KIT Dispense based on patient and insurance preference. Check sugar once daily as directed. (FOR ICD-9 250.00, 250.01). (Patient not taking: Reported on 12/01/2020)   Blood Glucose Monitoring Suppl (BLOOD GLUCOSE METER KIT AND SUPPLIES) KIT Test blood sugar daily. Dx code: E11.9 (Patient not taking: Reported on 12/01/2020)   diazepam (VALIUM) 5 MG tablet TAKE 1/2 TO 1 TABLET(2.5 TO 5 MG) BY MOUTH EVERY 12 HOURS AS NEEDED FOR ANXIETY (Patient not taking: Reported on 12/01/2020)   glucose blood test strip Test blood sugar daily. Dx Code: E11.9 (Patient not taking: Reported on 12/01/2020)   Lancets MISC Test blood sugar daily. Dx code: E11.9 (Patient not taking: Reported on 12/01/2020)   No facility-administered encounter medications on file as of 12/01/2020.    Allergies (verified) Patient has  no known allergies.   History: Past Medical History:  Diagnosis Date   Arthritis    Knees   Avascular necrosis of medial femoral condyle (HCC)    Chronic prostatitis    Diabetes mellitus ([place])    controlled with medication   Erectile dysfunction    Hearing loss    Hyperlipidemia    Hypertension    controlled with medication   Hypertrophy of prostate without urinary obstruction and other lower urinary tract symptoms (LUTS)    Infection of urinary tract     ASSESSMENT:Recurrent MRSA UTI F/B Dr. Clayborn Bigness and Dr. Jacqlyn Larsen   TIA (transient ischemic attack) 02/23/2014   R sided weakness and numbness; duration 1 hour; evaluated [facility].   Tobacco use disorder    Past Surgical History:  Procedure Laterality Date   Admission  03/09/2012   [place]; 24 hour admission; LE numbness/tingling.  CT head, MRI brain, carotid dopplers, 2D-echo.   AMPUTATION  2009   L fifth finger surgery for near amputation. [place].   BLADDER STONE REMOVAL     EYE SURGERY  08/09/2013   B cataract surgery; [place]  04/09/2013   3 repaired; [place].   JOINT REPLACEMENT     PARTIAL KNEE ARTHROPLASTY  07/24/2011   Procedure: UNICOMPARTMENTAL KNEE;  Surgeon: Lorn Junes, MD;  Location: [place];  Service: Orthopedics;  Laterality: Left;   PROSTATE SURGERY     RETINAL TEAR REPAIR CRYOTHERAPY  08/09/2013   [name]   Family History  Problem Relation Age of Onset   Hypertension Mother    Cancer Mother        lung   Heart attack Father    Hypertension Father    Hyperlipidemia Father    Aneurysm Father 85       Brain   Heart disease Father 15       AMI as cause of death   Diabetes Son    Healthy Sister    Social History   Socioeconomic History   Marital status: Widowed    Spouse name: Not on file   Number of children: 4   Years of education: 12   Highest education level: High school graduate  Occupational History   Occupation: Engineer, agricultural  Tobacco Use   Smoking status: Former    Packs/day: 0.50    Years: 20.00    Pack years: 10.00    Types: Cigarettes    Quit date: 01/09/1986    Years since quitting: 34.9   Smokeless tobacco: Current    Types: Chew   Tobacco comments:    chews about 1/2 a pack of tobacco currently;   Vaping Use   Vaping Use: Never used  Substance and Sexual Activity   Alcohol use: Yes    Alcohol/week: 8.0 - 10.0 standard drinks    Types: 8 - 10 Cans of beer per week    Comment: 4-5 beers per weekend night.  DUI in 2000.   Drug use:  No   Sexual activity: Not Currently    Partners: Female  Other Topics Concern   Not on file  Social History Narrative   Widowed since 02/2009 after 9 years second marriage       Children: 6 children, 2 stepchildren; 8 grandchildren, 2 gg.      Lives: with cat, dog.      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      Tobacco: quit smoking age 27; smoked x 22 years.  Alcohol:  Beer every weekend (5-6 beers per night on weekends).      Seatbelt:  Always uses seat belts, Smoke alarm in the home,      Guns: >5 guns in the home loaded and locked up.      Caffeine BEM:[id number], Tea, Carbonated beverages; consumes a moderate amount.      Exercise: Inactive.     Advanced Directives:  +LIVING WILL; FULL CODE, No HCPOA.      ADLs: independent with ADLs.  Drives.   Social Determinants of Health   Financial Resource Strain: Low Risk    Difficulty of Paying Living Expenses: Not hard at all  Food Insecurity: No Food Insecurity   Worried About Charity fundraiser in the Last Year: Never true   [place] in the Last Year: Never true  Transportation Needs: No Transportation Needs   Lack of Transportation (Medical): No   Lack of Transportation (Non-Medical): No  Physical Activity: Sufficiently Active   Days of Exercise per Week: 5 days   Minutes of Exercise per Session: 60 min  Stress: No Stress Concern Present   Feeling of Stress : Not at all  Social Connections: Socially Isolated   Frequency of Communication with Friends and Family: More than three times a week   Frequency of Social Gatherings with Friends and Family: Once a week   Attends Religious Services: Never   Marine scientist or Organizations: No   Attends Archivist Meetings: Never   Marital Status: Widowed    Tobacco Counseling Ready to quit: Not Answered Counseling given: Not Answered Tobacco comments: chews about 1/2 a pack of tobacco currently;    Clinical Intake:  Pre-visit  preparation completed: Yes  Pain : No/denies pain     Nutritional Status: BMI of 19-24  Normal Nutritional Risks: None Diabetes: No  How often do you need to have someone help you when you read instructions, pamphlets, or other written materials from your doctor or pharmacy?: 1 - Never  Diabetic? Pt states he was but no longer on medicine per MD  Interpreter Needed?: No  Information entered by :: Kirke Shaggy, LPN   Activities of Daily Living In your present state of health, do you have any difficulty performing the following activities: 12/01/2020 07/22/2020  Hearing? Tempie Donning  Vision? N N  Difficulty concentrating or making decisions? N N  Walking or climbing stairs? N N  Dressing or bathing? N N  Doing errands, shopping? N N  Preparing Food and eating ? N -  Using the Toilet? N -  In the past six months, have you accidently leaked urine? N -  Do you have problems with loss of bowel control? N -  Managing your Medications? N -  Managing your Finances? N -  Housekeeping or managing your Housekeeping? N -  Some recent data might be hidden    Patient Care Team: Brita Romp Dionne Bucy, MD as PCP - General (Family Medicine) Pieter Partridge, DO as Consulting Physician (Neurology) [name], MD as Consulting Physician (Urology) Elsie Saas, MD as Consulting Physician (Orthopedic Surgery) Anell Barr, OD as Consulting Physician (Optometry)  Indicate any recent Medical Services you may have received from other than [facility] providers in the past year (date may be approximate).     Assessment:   This is a routine wellness examination for Jensen.  Hearing/Vision screen Hearing Screening - Comments:: Hard of hearing, doesn't wear hearing aids Vision Screening - Comments::  Doesn't wear glasses- sees Dr. Raylene Everts in [place] (spelling?)  Dietary issues and exercise activities discussed: Current Exercise Habits: The patient has a physically strenuous job, but has no regular  exercise apart from work., Exercise limited by: None identified   Goals Addressed             This Visit's Progress    DIET - EAT MORE FRUITS AND VEGETABLES         Depression Screen PHQ 2/9 Scores 12/01/2020 12/01/2020 08/05/2020 07/22/2020 11/06/2019 10/06/2019 07/17/2018  PHQ - 2 Score 0 0 2 0 0 0 0  PHQ- 9 Score - - 4 0 0 1 -    Fall Risk Fall Risk  12/01/2020 07/22/2020 11/06/2019 10/06/2019 07/17/2018  Falls in the past year? 0 0 0 0 0  Number falls in past yr: 0 0 0 0 -  Injury with Fall? 0 0 0 0 -  Risk for fall due to : No Fall Risks - - No Fall Risks -  Follow up Falls prevention discussed - - Falls evaluation completed -    FALL RISK PREVENTION PERTAINING TO THE HOME:  Any stairs in or around the home? Yes  If so, are there any without handrails? No  Home free of loose throw rugs in walkways, pet beds, electrical cords, etc? Yes  Adequate lighting in your home to reduce risk of falls? Yes   ASSISTIVE DEVICES UTILIZED TO PREVENT FALLS:  Life alert? No  Use of a cane, walker or w/c? No  Grab bars in the bathroom? Yes  Shower chair or bench in shower? No  Elevated toilet seat or a handicapped toilet? Yes   TIMED UP AND GO:  Was the test performed? Yes .  Length of time to ambulate 10 feet: 3 sec.   Gait steady and fast without use of assistive device  Cognitive Function:Normal cognitive status assessed by direct observation by this Nurse Health Advisor. No abnormalities found.       6CIT Screen 07/11/2017  What Year? 0 points  What month? 0 points  What time? 0 points  Count back from 20 0 points  Months in reverse 2 points  Repeat phrase 4 points  Total Score 6    Immunizations Immunization History  Administered Date(s) Administered   DTaP 03/10/2011   Fluad Quad(high Dose 65+) 11/06/2018, 10/06/2019   Influenza Split 09/10/2010   Influenza, High Dose Seasonal PF 01/18/2017, 11/02/2017   Influenza,inj,Quad PF,6+ Mos 12/11/2012, 10/13/2013,  01/11/2015, 02/05/2016   PFIZER(Purple Top)SARS-COV-2 Vaccination 10/16/2019, 11/06/2019   Pneumococcal Conjugate-13 10/13/2013   Pneumococcal-Unspecified 03/10/2011   Tdap 06/16/2020    TDAP status: Up to date  Flu Vaccine status: Completed at today's visit  Pneumococcal vaccine status: Up to date  Covid-19 vaccine status: Completed vaccines  Qualifies for Shingles Vaccine? Yes   Zostavax completed No   Shingrix Completed?: No.    Education has been provided regarding the importance of this vaccine. Patient has been advised to call insurance company to determine out of pocket expense if they have not yet received this vaccine. Advised may also receive vaccine at local pharmacy or Health Dept. Verbalized acceptance and understanding.  Screening Tests Health Maintenance  Topic Date Due   Zoster Vaccines- Shingrix (1 of 2) Never done   Pneumonia Vaccine 54+ Years old (2 - PPSV23 if available, else PCV20) 10/14/2014   OPHTHALMOLOGY EXAM  07/15/2015   COVID-19 Vaccine (3 - Booster for Pfizer series) 01/01/2020   INFLUENZA VACCINE  08/09/2020  FOOT EXAM  11/05/2020   HEMOGLOBIN A1C  11/05/2020   TETANUS/TDAP  06/17/2030   HPV VACCINES  Aged Out    Health Maintenance  Health Maintenance Due  Topic Date Due   Zoster Vaccines- Shingrix (1 of 2) Never done   Pneumonia Vaccine 39+ Years old (2 - PPSV23 if available, else PCV20) 10/14/2014   OPHTHALMOLOGY EXAM  07/15/2015   COVID-19 Vaccine (3 - Booster for Pfizer series) 01/01/2020   INFLUENZA VACCINE  08/09/2020   FOOT EXAM  11/05/2020   HEMOGLOBIN A1C  11/05/2020    Colorectal cancer screening: No longer required.   Lung Cancer Screening: (Low Dose CT Chest recommended if Age 98-80 years, 30 pack-year currently smoking OR have quit w/in 15years.) does not qualify.    Additional Screening:  Hepatitis C Screening: does qualify; Completed no  Vision Screening: Recommended annual ophthalmology exams for early detection of  glaucoma and other disorders of the eye. Is the patient up to date with their annual eye exam?  Yes  Who is the provider or what is the name of the office in which the patient attends annual eye exams? Dr. Raylene Everts   Dental Screening: Recommended annual dental exams for proper oral hygiene  Community Resource Referral / Chronic Care Management: CRR required this visit?  No   CCM required this visit?  No      Plan:     I have personally reviewed and noted the following in the patient's chart:   Medical and social history Use of alcohol, tobacco or illicit drugs  Current medications and supplements including opioid prescriptions. Patient is currently taking opioid prescriptions. Information provided to patient regarding non-opioid alternatives. Patient advised to discuss non-opioid treatment plan with their provider. Functional ability and status Nutritional status Physical activity Advanced directives List of other physicians Hospitalizations, surgeries, and ER visits in previous 12 months Vitals Screenings to include cognitive, depression, and falls Referrals and appointments  In addition, I have reviewed and discussed with patient certain preventive protocols, quality metrics, and best practice recommendations. A written personalized care plan for preventive services as well as general preventive health recommendations were provided to patient.     Dionisio David, LPN   16/10/[id number]   Nurse Notes: flu shot given

## 2020-12-06 ENCOUNTER — Other Ambulatory Visit: Payer: Self-pay | Admitting: Urology

## 2020-12-06 DIAGNOSIS — N401 Enlarged prostate with lower urinary tract symptoms: Secondary | ICD-10-CM

## 2020-12-06 DIAGNOSIS — N138 Other obstructive and reflux uropathy: Secondary | ICD-10-CM

## 2020-12-17 ENCOUNTER — Ambulatory Visit: Payer: Self-pay | Admitting: Urology

## 2020-12-17 ENCOUNTER — Encounter: Payer: Self-pay | Admitting: Urology

## 2020-12-21 ENCOUNTER — Other Ambulatory Visit: Payer: Self-pay | Admitting: Family Medicine

## 2020-12-21 DIAGNOSIS — I10 Essential (primary) hypertension: Secondary | ICD-10-CM

## 2020-12-21 NOTE — Telephone Encounter (Signed)
Requested medication (s) are due for refill today: Yes  Requested medication (s) are on the active medication list: Yes  Last refill:  12/17/19  Future visit scheduled: Yes  Notes to clinic:  Prescription expired.    Requested Prescriptions  Pending Prescriptions Disp Refills   hydrochlorothiazide (HYDRODIURIL) 12.5 MG tablet [Pharmacy Med Name: HYDROCHLOROTHIAZIDE 12.5MG  TABLETS] 90 tablet 3    Sig: TAKE 1 TABLET(12.5 MG) BY MOUTH DAILY     Cardiovascular: Diuretics - Thiazide Passed - 12/21/2020  9:26 AM      Passed - Ca in normal range and within 360 days    Calcium  Date Value Ref Range Status  05/06/2020 9.8 8.6 - 10.2 mg/dL Final   Calcium, Total  Date Value Ref Range Status  10/21/2013 9.0 8.5 - 10.1 mg/dL Final          Passed - Cr in normal range and within 360 days    Creat  Date Value Ref Range Status  06/15/2015 0.81 0.70 - 1.18 mg/dL Final   Creatinine, Ser  Date Value Ref Range Status  05/06/2020 0.92 0.76 - 1.27 mg/dL Final          Passed - K in normal range and within 360 days    Potassium  Date Value Ref Range Status  05/06/2020 4.3 3.5 - 5.2 mmol/L Final  10/21/2013 3.6 3.5 - 5.1 mmol/L Final          Passed - Na in normal range and within 360 days    Sodium  Date Value Ref Range Status  05/06/2020 141 134 - 144 mmol/L Final  10/21/2013 140 136 - 145 mmol/L Final          Passed - Last BP in normal range    BP Readings from Last 1 Encounters:  12/01/20 (!) 120/58          Passed - Valid encounter within last 6 months    Recent Outpatient Visits           4 months ago Panic disorder   [facility] [place], Dionne Bucy, MD   5 months ago Laceration of left palm, subsequent encounter   [facility], Vickki Muff, PA-C   7 months ago Type 2 diabetes mellitus with other specified complication, without long-term current use of insulin [facility])   [facility] [place], Dionne Bucy, MD   1  year ago Encounter for annual physical exam   [facility] [place], Dionne Bucy, MD   1 year ago Type 2 diabetes mellitus with other specified complication, without long-term current use of insulin [facility])   [facility], Dionne Bucy, MD

## 2020-12-27 ENCOUNTER — Other Ambulatory Visit: Payer: Self-pay | Admitting: Family Medicine

## 2021-01-24 ENCOUNTER — Telehealth: Payer: Self-pay | Admitting: Family Medicine

## 2021-01-24 DIAGNOSIS — I10 Essential (primary) hypertension: Secondary | ICD-10-CM

## 2021-01-24 NOTE — Telephone Encounter (Signed)
Courtesy refill Requested Prescriptions  Pending Prescriptions Disp Refills   hydrochlorothiazide (HYDRODIURIL) 12.5 MG tablet [Pharmacy Med Name: HYDROCHLOROTHIAZIDE 12.5MG  TABLETS] 30 tablet 0    Sig: TAKE 1 TABLET(12.5 MG) BY MOUTH DAILY     Cardiovascular: Diuretics - Thiazide Passed - 01/24/2021  9:59 AM      Passed - Ca in normal range and within 360 days    Calcium  Date Value Ref Range Status  05/06/2020 9.8 8.6 - 10.2 mg/dL Final   Calcium, Total  Date Value Ref Range Status  10/21/2013 9.0 8.5 - 10.1 mg/dL Final         Passed - Cr in normal range and within 360 days    Creat  Date Value Ref Range Status  06/15/2015 0.81 0.70 - 1.18 mg/dL Final   Creatinine, Ser  Date Value Ref Range Status  05/06/2020 0.92 0.76 - 1.27 mg/dL Final         Passed - K in normal range and within 360 days    Potassium  Date Value Ref Range Status  05/06/2020 4.3 3.5 - 5.2 mmol/L Final  10/21/2013 3.6 3.5 - 5.1 mmol/L Final         Passed - Na in normal range and within 360 days    Sodium  Date Value Ref Range Status  05/06/2020 141 134 - 144 mmol/L Final  10/21/2013 140 136 - 145 mmol/L Final         Passed - Last BP in normal range    BP Readings from Last 1 Encounters:  12/01/20 (!) 120/58         Passed - Valid encounter within last 6 months    Recent Outpatient Visits          5 months ago Panic disorder   [facility] [place], Dionne Bucy, MD   6 months ago Laceration of left palm, subsequent encounter   [facility], Vickki Muff, PA-C   8 months ago Type 2 diabetes mellitus with other specified complication, without long-term current use of insulin ([place])   [facility] [place], Dionne Bucy, MD   1 year ago Encounter for annual physical exam   [facility] [place], Dionne Bucy, MD   1 year ago Type 2 diabetes mellitus with other specified complication, without long-term current use of insulin [facility])    [facility], Dionne Bucy, MD

## 2021-01-25 MED ORDER — HYDROCHLOROTHIAZIDE 12.5 MG PO TABS: ORAL_TABLET | ORAL | 0 refills | Status: DC

## 2021-01-25 NOTE — Addendum Note (Signed)
Addended by: Shawna Orleans on: 01/25/2021 03:26 PM   Modules accepted: Orders

## 2021-01-25 NOTE — Telephone Encounter (Signed)
Pharmacy is requesting a 90 day supply on this medication.  Please advise

## 2021-01-28 ENCOUNTER — Other Ambulatory Visit: Payer: Self-pay | Admitting: Urology

## 2021-02-25 ENCOUNTER — Other Ambulatory Visit: Payer: Self-pay | Admitting: Family Medicine

## 2021-02-25 NOTE — Telephone Encounter (Signed)
Please send in #[phone] and get him scheduled for a f/u visit please.

## 2021-02-28 ENCOUNTER — Other Ambulatory Visit: Payer: Self-pay | Admitting: Family Medicine

## 2021-02-28 DIAGNOSIS — I10 Essential (primary) hypertension: Secondary | ICD-10-CM

## 2021-06-27 ENCOUNTER — Telehealth: Payer: Self-pay | Admitting: Family Medicine

## 2021-06-27 ENCOUNTER — Other Ambulatory Visit: Payer: Self-pay

## 2021-06-27 DIAGNOSIS — I10 Essential (primary) hypertension: Secondary | ICD-10-CM

## 2021-06-27 MED ORDER — CLOPIDOGREL BISULFATE 75 MG PO TABS: ORAL_TABLET | ORAL | 0 refills | Status: AC

## 2021-06-27 MED ORDER — HYDROCHLOROTHIAZIDE 12.5 MG PO TABS: ORAL_TABLET | ORAL | 0 refills | Status: DC

## 2021-06-27 NOTE — Telephone Encounter (Signed)
[facility] faxed refill request for the following medications:   hydrochlorothiazide (HYDRODIURIL) 12.5 MG tablet  Please advise

## 2021-06-27 NOTE — Telephone Encounter (Signed)
[facility] faxed refill request for the following medications:   clopidogrel (PLAVIX) 75 MG tablet  Please advise

## 2021-07-14 ENCOUNTER — Telehealth: Payer: Self-pay | Admitting: Family Medicine

## 2021-07-14 MED ORDER — LISINOPRIL 40 MG PO TABS: 40.0000 mg | ORAL_TABLET | Freq: Every day | ORAL | 0 refills | Status: DC

## 2021-07-14 NOTE — Telephone Encounter (Signed)
[facility] faxed refill request for the following medications:  lisinopril (ZESTRIL) 40 MG tablet   Please advise.

## 2021-07-22 ENCOUNTER — Other Ambulatory Visit: Payer: Self-pay | Admitting: Family Medicine

## 2021-07-22 NOTE — Telephone Encounter (Signed)
Requested medication (s) are due for refill today: yes  Requested medication (s) are on the active medication list: yes  Last refill:  11/09/20  Future visit scheduled:yes  Notes to clinic:  Unable to refill per protocol, last refill by another provider. Routing for approval.     Requested Prescriptions  Pending Prescriptions Disp Refills   sildenafil (REVATIO) 20 MG tablet 30 tablet 3     Urology: Erectile Dysfunction Agents Failed - 07/22/2021  1:12 PM      Failed - AST in normal range and within 360 days    AST  Date Value Ref Range Status  10/06/2019 22 0 - 40 IU/L Final   SGOT(AST)  Date Value Ref Range Status  10/21/2013 44 (H) 15 - 37 Unit/L Final         Failed - ALT in normal range and within 360 days    ALT  Date Value Ref Range Status  10/06/2019 17 0 - 44 IU/L Final   SGPT (ALT)  Date Value Ref Range Status  10/21/2013 48 U/L Final    Comment:    14-63 NOTE: New Reference Range 07/29/13          Passed - Last BP in normal range    BP Readings from Last 1 Encounters:  12/01/20 (!) 120/58         Passed - Valid encounter within last 12 months    Recent Outpatient Visits           11 months ago Panic disorder   [facility], Dionne Bucy, MD   1 year ago Laceration of left palm, subsequent encounter   [place], Vickki Muff, PA-C   1 year ago Type 2 diabetes mellitus with other specified complication, without long-term current use of insulin [facility])   [facility], Dionne Bucy, MD   1 year ago Encounter for annual physical exam   [facility] [place], Dionne Bucy, MD   1 year ago Type 2 diabetes mellitus with other specified complication, without long-term current use of insulin [facility])   [place], Dionne Bucy, MD       Future Appointments             In 1 week [place], Jake Church, [place], [facility]

## 2021-07-22 NOTE — Telephone Encounter (Addendum)
Medication Refill - Medication: sildenafil (REVATIO) 20 MG tablet  Has the patient contacted their pharmacy? No. No, more refills.  (Agent: If no, request that the patient contact the pharmacy for the refill. If patient does not wish to contact the pharmacy document the reason why and proceed with request.)   Preferred Pharmacy (with phone number or street name):  [facility] [place] [address] [facility]  [place] [address]  Phone: [phone] Fax: [phone]  Hours: Not open 24 hours   Has the patient been seen for an appointment in the last year OR does the patient have an upcoming appointment? Yes.   Scheduled pt per request 08/02/2021 w/Dr.Rumball.  Agent: Please be advised that RX refills may take up to 3 business days. We ask that you follow-up with your pharmacy.

## 2021-07-25 NOTE — Telephone Encounter (Signed)
LM informing pt.

## 2021-08-02 ENCOUNTER — Encounter: Payer: Self-pay | Admitting: Family Medicine

## 2021-08-02 ENCOUNTER — Ambulatory Visit (INDEPENDENT_AMBULATORY_CARE_PROVIDER_SITE_OTHER): Payer: Medicare PPO | Admitting: Family Medicine

## 2021-08-02 VITALS — BP 177/116 | HR 87 | Temp 98.2°F | Resp 16 | Ht 72.0 in | Wt 172.0 lb

## 2021-08-02 DIAGNOSIS — I10 Essential (primary) hypertension: Secondary | ICD-10-CM

## 2021-08-02 DIAGNOSIS — Z8673 Personal history of transient ischemic attack (TIA), and cerebral infarction without residual deficits: Secondary | ICD-10-CM

## 2021-08-02 DIAGNOSIS — N138 Other obstructive and reflux uropathy: Secondary | ICD-10-CM

## 2021-08-02 DIAGNOSIS — E785 Hyperlipidemia, unspecified: Secondary | ICD-10-CM

## 2021-08-02 DIAGNOSIS — E1169 Type 2 diabetes mellitus with other specified complication: Secondary | ICD-10-CM

## 2021-08-02 DIAGNOSIS — I152 Hypertension secondary to endocrine disorders: Secondary | ICD-10-CM

## 2021-08-02 DIAGNOSIS — N401 Enlarged prostate with lower urinary tract symptoms: Secondary | ICD-10-CM | POA: Diagnosis not present

## 2021-08-02 DIAGNOSIS — Z23 Encounter for immunization: Secondary | ICD-10-CM

## 2021-08-02 DIAGNOSIS — E1159 Type 2 diabetes mellitus with other circulatory complications: Secondary | ICD-10-CM

## 2021-08-02 DIAGNOSIS — F411 Generalized anxiety disorder: Secondary | ICD-10-CM

## 2021-08-02 DIAGNOSIS — F410 Panic disorder [episodic paroxysmal anxiety] without agoraphobia: Secondary | ICD-10-CM

## 2021-08-02 MED ORDER — LOVASTATIN 40 MG PO TABS: 40.0000 mg | ORAL_TABLET | Freq: Every day | ORAL | 3 refills | Status: DC

## 2021-08-02 MED ORDER — LISINOPRIL 40 MG PO TABS: 40.0000 mg | ORAL_TABLET | Freq: Every day | ORAL | 1 refills | Status: DC

## 2021-08-02 MED ORDER — HYDROCHLOROTHIAZIDE 12.5 MG PO TABS: ORAL_TABLET | ORAL | 1 refills | Status: DC

## 2021-08-02 NOTE — Patient Instructions (Signed)
It was great to see you!  Our plans for today:  - We sent refills for your medications to the pharmacy.  - Someone will call you with appointments for the foot doctor and eye doctor. - Come back in 1 month for your blood pressure recheck   We are checking some labs today, we will release these results to your MyChart.  Take care and seek immediate care sooner if you develop any concerns.   Dr. Ky Barban

## 2021-08-02 NOTE — Progress Notes (Deleted)
   SUBJECTIVE:   CHIEF COMPLAINT / HPI:   Hypertension: - Medications: HCTZ, lisinopril - Compliance: has been out several months - Checking BP at home: not lately - Denies any SOB, CP, vision changes, LE edema, medication SEs, or symptoms of hypotension  Diabetes, Type 2 - Last A1c 5.8 - Medications: diet controlled - Compliance: n/a - Checking BG at home: no - Eye exam: due - Foot exam: due - Microalbumin: due - Statin: yes - PNA vaccine: due - Denies symptoms of hypoglycemia, polyuria, polydipsia, numbness extremities, foot ulcers/trauma  HLD - medications: lovastatin - compliance: has been out for months - medication SEs: none  Benign Prostatic Hypertrophy - Medications: flomax - Symptoms: {sx:10412} {complic:10414} - Denies: incomplete emptying, nocturia {frequency:10413}, straining, urgency, and weak stream {complic:10414} - no personal history and no family history of prostate cancer - AUA Symptom Score is, {0-35:17906}/{0-35:17906}  Anxiety - Medications: zoloft - Taking: *** - Counseling: *** - Previous hospitalizations: *** - FH of psych illness: *** - Symptoms: *** - Current stressors: *** - Coping Mechanisms: ***     08/02/2021   10:32 AM  GAD 7 : Generalized Anxiety Score  Nervous, Anxious, on Edge 1  Control/stop worrying 0  Worry too much - different things 0  Trouble relaxing 0  Restless 0  Easily annoyed or irritable 0  Afraid - awful might happen 0  Total GAD 7 Score 1       OBJECTIVE:   BP (!) 177/116 (BP Location: Left Arm, Patient Position: Sitting, Cuff Size: Normal)   Pulse 87   Temp 98.2 F (36.8 C) (Oral)   Resp 16   Ht 6' (1.829 m)   Wt 172 lb (78 kg)   BMI 23.33 kg/m   ***  ASSESSMENT/PLAN:   No problem-specific Assessment & Plan notes found for this encounter.     Caro Laroche, DO

## 2021-08-02 NOTE — Progress Notes (Unsigned)
   SUBJECTIVE:   CHIEF COMPLAINT / HPI:   Hypertension: - Medications: HCTZ, lisinopril - Compliance: has been out several months - Checking BP at home: not lately - Denies any SOB, CP, vision changes, LE edema, medication SEs, or symptoms of hypotension  Diabetes, Type 2 - Last A1c 5.8 - Medications: diet controlled - Compliance: n/a - Checking BG at home: no - Eye exam: due - Foot exam: due - Microalbumin: due - Statin: yes - PNA vaccine: due - Denies symptoms of hypoglycemia, polyuria, polydipsia, numbness extremities, foot ulcers/trauma  HLD - medications: lovastatin - compliance: has been out for months - medication SEs: none  Benign Prostatic Hypertrophy - Medications: flomax (has not taken) - Symptoms: none  - Denies: incomplete emptying, nocturia one time a night, straining, urgency, and weak stream no complicating symptoms  Anxiety - Medications: zoloft - Taking: not taking (reports was taking during stressful time only) - Counseling: no - Previous hospitalizations: no - Symptoms: none     08/02/2021   10:32 AM  GAD 7 : Generalized Anxiety Score  Nervous, Anxious, on Edge 1  Control/stop worrying 0  Worry too much - different things 0  Trouble relaxing 0  Restless 0  Easily annoyed or irritable 0  Afraid - awful might happen 0  Total GAD 7 Score 1    OBJECTIVE:   BP (!) 177/116 (BP Location: Left Arm, Patient Position: Sitting, Cuff Size: Normal)   Pulse 87   Temp 98.2 F (36.8 C) (Oral)   Resp 16   Ht 6' (1.829 m)   Wt 172 lb (78 kg)   BMI 23.33 kg/m   Gen: elderly, in NAD Card: RRR Lungs: CTAB Ext: WWP, no edema. Foot exam with plantar skin tag, few calluses and hypertrophic, long nails. Monofilament intact.    ASSESSMENT/PLAN:   Hypertension associated with diabetes ([place]) Elevated today but has been out of meds for months. Will refill today and f/u in 1 month for recheck. Obtaining labs.   Diabetes mellitus ([place]) Recheck a1c and  treat as indicated. Foot exam completed today, referred to podiatry for nail/callus trimming, also with plantar skin tag. Referred for diabetic eye exam. PCV20 received today. Refilled statin, ACEI.  Hyperlipidemia associated with type 2 diabetes mellitus ([place]) Recheck FLP though has been without statin for months. Refill sent today.  Benign localized hyperplasia of prostate with urinary obstruction Denies current symptoms off flomax. Continue to monitor.  Generalized anxiety disorder with panic attacks Denies symptoms/difficulties, off zoloft for months. Denies recent panic attacks and attributes previous symptoms to prior stressful event though chart documentation of attacks in 2017 as well as 2020. GAD7 1 today. Continue to reassess on f/u.      Myles Gip, DO

## 2021-08-03 ENCOUNTER — Encounter: Payer: Self-pay | Admitting: Urology

## 2021-08-03 ENCOUNTER — Ambulatory Visit (INDEPENDENT_AMBULATORY_CARE_PROVIDER_SITE_OTHER): Payer: Medicare PPO | Admitting: Urology

## 2021-08-03 VITALS — BP 156/98 | HR 101 | Ht 72.0 in | Wt 172.0 lb

## 2021-08-03 DIAGNOSIS — N5201 Erectile dysfunction due to arterial insufficiency: Secondary | ICD-10-CM | POA: Diagnosis not present

## 2021-08-03 DIAGNOSIS — N138 Other obstructive and reflux uropathy: Secondary | ICD-10-CM | POA: Diagnosis not present

## 2021-08-03 DIAGNOSIS — N401 Enlarged prostate with lower urinary tract symptoms: Secondary | ICD-10-CM | POA: Diagnosis not present

## 2021-08-03 DIAGNOSIS — R339 Retention of urine, unspecified: Secondary | ICD-10-CM | POA: Diagnosis not present

## 2021-08-03 LAB — CBC WITH DIFFERENTIAL/PLATELET
Basophils Absolute: 0 10*3/uL (ref 0.0–0.2)
Basos: 1 %
EOS (ABSOLUTE): 0.1 10*3/uL (ref 0.0–0.4)
Eos: 2 %
Hematocrit: 50.8 % (ref 37.5–51.0)
Hemoglobin: 17.4 g/dL (ref 13.0–17.7)
Immature Grans (Abs): 0 10*3/uL (ref 0.0–0.1)
Immature Granulocytes: 0 %
Lymphocytes Absolute: 0.8 10*3/uL (ref 0.7–3.1)
Lymphs: 17 %
MCH: 33.1 pg — ABNORMAL HIGH (ref 26.6–33.0)
MCHC: 34.3 g/dL (ref 31.5–35.7)
MCV: 97 fL (ref 79–97)
Monocytes Absolute: 0.5 10*3/uL (ref 0.1–0.9)
Monocytes: 9 %
Neutrophils Absolute: 3.6 10*3/uL (ref 1.4–7.0)
Neutrophils: 71 %
Platelets: 184 10*3/uL (ref 150–450)
RBC: 5.26 x10E6/uL (ref 4.14–5.80)
RDW: 13.1 % (ref 11.6–15.4)
WBC: 5 10*3/uL (ref 3.4–10.8)

## 2021-08-03 LAB — COMPREHENSIVE METABOLIC PANEL
ALT: 16 IU/L (ref 0–44)
AST: 27 IU/L (ref 0–40)
Albumin/Globulin Ratio: 1.5 (ref 1.2–2.2)
Albumin: 4.3 g/dL (ref 3.7–4.7)
Alkaline Phosphatase: 114 IU/L (ref 44–121)
BUN/Creatinine Ratio: 10 (ref 10–24)
BUN: 9 mg/dL (ref 8–27)
Bilirubin Total: 0.8 mg/dL (ref 0.0–1.2)
CO2: 25 mmol/L (ref 20–29)
Calcium: 9.5 mg/dL (ref 8.6–10.2)
Chloride: 102 mmol/L (ref 96–106)
Creatinine, Ser: 0.9 mg/dL (ref 0.76–1.27)
Globulin, Total: 2.9 g/dL (ref 1.5–4.5)
Glucose: 115 mg/dL — ABNORMAL HIGH (ref 70–99)
Potassium: 4.5 mmol/L (ref 3.5–5.2)
Sodium: 142 mmol/L (ref 134–144)
Total Protein: 7.2 g/dL (ref 6.0–8.5)
eGFR: 85 mL/min/{1.73_m2} (ref >59)

## 2021-08-03 LAB — LIPID PANEL
Chol/HDL Ratio: 3.1 ratio (ref 0.0–5.0)
Cholesterol, Total: 162 mg/dL (ref 100–199)
HDL: 53 mg/dL (ref >39)
LDL Chol Calc (NIH): 95 mg/dL (ref 0–99)
Triglycerides: 71 mg/dL (ref 0–149)
VLDL Cholesterol Cal: 14 mg/dL (ref 5–40)

## 2021-08-03 LAB — MICROALBUMIN / CREATININE URINE RATIO
Creatinine, Urine: 54.1 mg/dL
Microalb/Creat Ratio: 33 mg/g creat — ABNORMAL HIGH (ref 0–29)
Microalbumin, Urine: 17.8 ug/mL

## 2021-08-03 LAB — BLADDER SCAN AMB NON-IMAGING: Scan Result: 480

## 2021-08-03 LAB — HEMOGLOBIN A1C
Est. average glucose Bld gHb Est-mCnc: 105 mg/dL
Hgb A1c MFr Bld: 5.3 % (ref 4.8–5.6)

## 2021-08-03 MED ORDER — TAMSULOSIN HCL 0.4 MG PO CAPS: ORAL_CAPSULE | ORAL | 3 refills | Status: AC

## 2021-08-03 MED ORDER — SILDENAFIL CITRATE 20 MG PO TABS: ORAL_TABLET | ORAL | 3 refills | Status: DC

## 2021-08-03 NOTE — Progress Notes (Signed)
Informed pt with results. Pt verbalized understanding. Pt wants statin medication sent to [facility].  KP

## 2021-08-03 NOTE — Progress Notes (Signed)
08/03/2021 3:08 PM   Todd Diaz [birth year] [id number]  Referring provider: Virginia Crews, MD [address] [place],  [place] [address]  Chief Complaint  Patient presents with   Benign Prostatic Hypertrophy    Urologic history: 1.  BPH with lower urinary tract symptoms -Prior TURP several years ago -On tamsulosin  -Incomplete emptying with PVRs ranging from 130-415; typically in 200 range   2.  History of bladder calculus   3.  Erectile dysfunction -Sildenafil 60 mg prn  HPI: 81 y.o. male presents for annual follow-up.  Denies any problems since last years visit Stable LUTS though he recently ran out of several of his medications and is not sure if the has been taking tamsulosin the past few months Denies dysuria, gross hematuria Denies flank, abdominal or pelvic pain Continue sildenafil as needed for ED   PMH: Past Medical History:  Diagnosis Date   Arthritis    Knees   Avascular necrosis of medial femoral condyle (HCC)    Chronic prostatitis    Diabetes mellitus ([place])    controlled with medication   Erectile dysfunction    Hearing loss    Hyperlipidemia    Hypertension    controlled with medication   Hypertrophy of prostate without urinary obstruction and other lower urinary tract symptoms (LUTS)    Infection of urinary tract    ASSESSMENT:Recurrent MRSA UTI F/B Dr. Clayborn Bigness and Dr. Jacqlyn Larsen   TIA (transient ischemic attack) 02/23/2014   R sided weakness and numbness; duration 1 hour; evaluated [facility].   Tobacco use disorder     Surgical History: Past Surgical History:  Procedure Laterality Date   Admission  03/09/2012   [facility]; 24 hour admission; LE numbness/tingling.  CT head, MRI brain, carotid dopplers, 2D-echo.   AMPUTATION  2009   L fifth finger surgery for near amputation. [place].   BLADDER STONE REMOVAL     EYE SURGERY  08/09/2013   B cataract surgery; [place]  04/09/2013   3 repaired; [place].   JOINT REPLACEMENT      PARTIAL KNEE ARTHROPLASTY  07/24/2011   Procedure: UNICOMPARTMENTAL KNEE;  Surgeon: Lorn Junes, MD;  Location: [facility];  Service: Orthopedics;  Laterality: Left;   PROSTATE SURGERY     RETINAL TEAR REPAIR CRYOTHERAPY  08/09/2013   [name]    Home Medications:  Allergies as of 08/03/2021   No Known Allergies      Medication List        Accurate as of August 03, 2021  3:08 PM. If you have any questions, ask your nurse or doctor.          Accu-Chek Compact Plus Control Soln Test blood sugar daily. Dx code: E11.9   aspirin EC 81 MG tablet Take 81 mg by mouth daily.   blood glucose meter kit and supplies Kit Test blood sugar daily. Dx code: E11.9   blood glucose meter kit and supplies Kit Dispense based on patient and insurance preference. Check sugar once daily as directed. (FOR ICD-9 250.00, 250.01).   clopidogrel 75 MG tablet Commonly known as: PLAVIX TAKE 1 TABLET(75 MG) BY MOUTH DAILY. Please schedule office visit before any future refill.   gabapentin 300 MG capsule Commonly known as: NEURONTIN TAKE 1 CAPSULE(300 MG) BY MOUTH AT BEDTIME. Please schedule office visit before any future refills   glucose blood test strip Test blood sugar daily. Dx Code: E11.9   hydrochlorothiazide 12.5 MG tablet Commonly known as: HYDRODIURIL  TAKE 1 TABLET(12.5 MG) BY MOUTH DAILY. Please schedule office visit before any future refill.   Lancets Misc Test blood sugar daily. Dx code: E11.9   lisinopril 40 MG tablet Commonly known as: ZESTRIL Take 1 tablet (40 mg total) by mouth daily.   lovastatin 40 MG tablet Commonly known as: MEVACOR Take 1 tablet (40 mg total) by mouth at bedtime.   sildenafil 20 MG tablet Commonly known as: REVATIO TAKE 2 TO 5 TABLETS BY MOUTH 1 HOUR BEFORE SEXUAL ACTIVITY   tamsulosin 0.4 MG Caps capsule Commonly known as: FLOMAX TAKE 1 CAPSULE(0.4 MG) BY MOUTH DAILY AFTER BREAKFAST        Allergies: No Known Allergies  Family  History: Family History  Problem Relation Age of Onset   Hypertension Mother    Cancer Mother        lung   Heart attack Father    Hypertension Father    Hyperlipidemia Father    Aneurysm Father 13       Brain   Heart disease Father 88       AMI as cause of death   Diabetes Son    Healthy Sister     Social History:  reports that he quit smoking about 35 years ago. His smoking use included cigarettes. He has a 10.00 pack-year smoking history. His smokeless tobacco use includes chew. He reports current alcohol use of about 8.0 - 10.0 standard drinks of alcohol per week. He reports that he does not use drugs.   Physical Exam: BP (!) 156/98   Pulse (!) 101   Ht 6' (1.829 m)   Wt 172 lb (78 kg)   BMI 23.33 kg/m   Constitutional:  Alert and oriented, No acute distress. HEENT: [place] AT Respiratory: Normal respiratory effort, no increased work of breathing. Psychiatric: Normal mood and affect.   Assessment & Plan:    1. Benign localized hyperplasia of prostate with incomplete bladder emptying PVR above baseline at 480 mL Unsure if he has run out of tamsulosin.  Refill sent to pharmacy and encouraged to make sure he is taking Follow-up 2 months for repeat PVR Continue annual follow-up  2.  Erectile dysfunction Sildenafil refilled   [name], MD  [facility] [address] [place],  [address] [phone]

## 2021-08-04 NOTE — Assessment & Plan Note (Signed)
Recheck FLP though has been without statin for months. Refill sent today.

## 2021-08-04 NOTE — Assessment & Plan Note (Signed)
Elevated today but has been out of meds for months. Will refill today and f/u in 1 month for recheck. Obtaining labs.

## 2021-08-04 NOTE — Assessment & Plan Note (Addendum)
Denies symptoms/difficulties, off zoloft for months. Denies recent panic attacks and attributes previous symptoms to prior stressful event though chart documentation of attacks in 2017 as well as 2020. GAD7 1 today. Continue to reassess on f/u.

## 2021-08-04 NOTE — Assessment & Plan Note (Signed)
Denies current symptoms off flomax. Continue to monitor.

## 2021-08-04 NOTE — Assessment & Plan Note (Addendum)
Recheck a1c and treat as indicated. Foot exam completed today, referred to podiatry for nail/callus trimming, also with plantar skin tag. Referred for diabetic eye exam. PCV20 received today. Refilled statin, ACEI.

## 2021-08-06 ENCOUNTER — Encounter: Payer: Self-pay | Admitting: Urology

## 2021-09-27 NOTE — Progress Notes (Deleted)
09/28/2021 9:08 PM   Todd Diaz December 31, 1940 [id number]  Referring provider: Virginia Crews, [place],  [place] [address]  Urological history: 1. BPH with lower urinary tract symptoms -I PSS *** -PVR *** -Prior TURP several years ago -On tamsulosin  -Incomplete emptying with PVRs ranging from 130-415; typically in 200 range   2. Bladder calculus -cystolitholapaxy several years ago   3.  Erectile dysfunction -Contributing factors of age, diabetes, BPH, hyperlipidemia, hypertension, smoker, TIAs and alcohol consumption -Sildenafil 60 mg prn   4. Phimosis  No chief complaint on file.   HPI: Todd Diaz is a 81 y.o. male with diabetes who presents today for recheck on his PVR.      Score:  1-7 Mild 8-19 Moderate 20-35 Severe    PMH: Past Medical History:  Diagnosis Date   Arthritis    Knees   Avascular necrosis of medial femoral condyle (HCC)    Chronic prostatitis    Diabetes mellitus ([place])    controlled with medication   Erectile dysfunction    Hearing loss    Hyperlipidemia    Hypertension    controlled with medication   Hypertrophy of prostate without urinary obstruction and other lower urinary tract symptoms (LUTS)    Infection of urinary tract    ASSESSMENT:Recurrent MRSA UTI F/B Dr. Clayborn Bigness and Dr. Jacqlyn Larsen   TIA (transient ischemic attack) 02/23/2014   R sided weakness and numbness; duration 1 hour; evaluated [facility].   Tobacco use disorder     Surgical History: Past Surgical History:  Procedure Laterality Date   Admission  03/09/2012   [place]; 24 hour admission; LE numbness/tingling.  CT head, MRI brain, carotid dopplers, 2D-echo.   AMPUTATION  2009   L fifth finger surgery for near amputation. [place].   BLADDER STONE REMOVAL     EYE SURGERY  08/09/2013   B cataract surgery; [place]  04/09/2013   3 repaired; [place].   JOINT REPLACEMENT     PARTIAL KNEE ARTHROPLASTY  07/24/2011   Procedure:  UNICOMPARTMENTAL KNEE;  Surgeon: Lorn Junes, MD;  Location: [facility];  Service: Orthopedics;  Laterality: Left;   PROSTATE SURGERY     RETINAL TEAR REPAIR CRYOTHERAPY  08/09/2013   [name]    Home Medications:  Allergies as of 09/28/2021   No Known Allergies      Medication List        Accurate as of September 27, 2021  9:08 PM. If you have any questions, ask your nurse or doctor.          Accu-Chek Compact Plus Control Soln Test blood sugar daily. Dx code: E11.9   aspirin EC 81 MG tablet Take 81 mg by mouth daily.   blood glucose meter kit and supplies Kit Test blood sugar daily. Dx code: E11.9   blood glucose meter kit and supplies Kit Dispense based on patient and insurance preference. Check sugar once daily as directed. (FOR ICD-9 250.00, 250.01).   clopidogrel 75 MG tablet Commonly known as: PLAVIX TAKE 1 TABLET(75 MG) BY MOUTH DAILY. Please schedule office visit before any future refill.   gabapentin 300 MG capsule Commonly known as: NEURONTIN TAKE 1 CAPSULE(300 MG) BY MOUTH AT BEDTIME. Please schedule office visit before any future refills   glucose blood test strip Test blood sugar daily. Dx Code: E11.9   hydrochlorothiazide 12.5 MG tablet Commonly known as: HYDRODIURIL TAKE 1 TABLET(12.5 MG) BY MOUTH DAILY. Please schedule  office visit before any future refill.   Lancets Misc Test blood sugar daily. Dx code: E11.9   lisinopril 40 MG tablet Commonly known as: ZESTRIL Take 1 tablet (40 mg total) by mouth daily.   lovastatin 40 MG tablet Commonly known as: MEVACOR Take 1 tablet (40 mg total) by mouth at bedtime.   sildenafil 20 MG tablet Commonly known as: REVATIO TAKE 2 TO 5 TABLETS BY MOUTH 1 HOUR BEFORE SEXUAL ACTIVITY   tamsulosin 0.4 MG Caps capsule Commonly known as: FLOMAX TAKE 1 CAPSULE(0.4 MG) BY MOUTH DAILY AFTER BREAKFAST        Allergies: No Known Allergies  Family History: Family History  Problem Relation Age of Onset    Hypertension Mother    Cancer Mother        lung   Heart attack Father    Hypertension Father    Hyperlipidemia Father    Aneurysm Father 102       Brain   Heart disease Father 26       AMI as cause of death   Diabetes Son    Healthy Sister     Social History:  reports that he quit smoking about 35 years ago. His smoking use included cigarettes. He has a 10.00 pack-year smoking history. His smokeless tobacco use includes chew. He reports current alcohol use of about 8.0 - 10.0 standard drinks of alcohol per week. He reports that he does not use drugs.  ROS: Pertinent ROS in HPI  Physical Exam: There were no vitals taken for this visit.  Constitutional:  Well nourished. Alert and oriented, No acute distress. HEENT: [place] AT, moist mucus membranes.  Trachea midline, no masses. Cardiovascular: No clubbing, cyanosis, or edema. Respiratory: Normal respiratory effort, no increased work of breathing. GI: Abdomen is soft, non tender, non distended, no abdominal masses. Liver and spleen not palpable.  No hernias appreciated.  Stool sample for occult testing is not indicated.   GU: No CVA tenderness.  No bladder fullness or masses.  Patient with circumcised/uncircumcised phallus. ***Foreskin easily retracted***  Urethral meatus is patent.  No penile discharge. No penile lesions or rashes. Scrotum without lesions, cysts, rashes and/or edema.  Testicles are located scrotally bilaterally. No masses are appreciated in the testicles. Left and right epididymis are normal. Rectal: Patient with  normal sphincter tone. Anus and perineum without scarring or rashes. No rectal masses are appreciated. Prostate is approximately *** grams, *** nodules are appreciated. Seminal vesicles are normal. Skin: No rashes, bruises or suspicious lesions. Lymph: No cervical or inguinal adenopathy. Neurologic: Grossly intact, no focal deficits, moving all 4 extremities. Psychiatric: Normal mood and affect.  Laboratory  Data: Lab Results  Component Value Date   WBC 5.0 08/02/2021   HGB 17.4 08/02/2021   HCT 50.8 08/02/2021   MCV 97 08/02/2021   PLT 184 08/02/2021    Lab Results  Component Value Date   CREATININE 0.90 08/02/2021    Lab Results  Component Value Date   HGBA1C 5.3 08/02/2021       Component Value Date/Time   CHOL 162 08/02/2021 1057   CHOL 177 04/09/2012 0432   HDL 53 08/02/2021 1057   HDL 30 (L) 04/09/2012 0432   CHOLHDL 3.1 08/02/2021 1057   CHOLHDL 6.0 (H) 06/15/2015 1336   VLDL 72 (H) 06/15/2015 1336   VLDL 45 (H) 04/09/2012 0432   LDLCALC 95 08/02/2021 1057   LDLCALC 102 (H) 04/09/2012 0432    Lab Results  Component Value Date  AST 27 08/02/2021   Lab Results  Component Value Date   ALT 16 08/02/2021    Urinalysis    Component Value Date/Time   COLORURINE Yellow 04/07/2012 2301   COLORURINE YELLOW 07/17/2011 1252   APPEARANCEUR Clear 12/18/2019 1407   LABSPEC 1.012 04/07/2012 2301   PHURINE 5.0 04/07/2012 2301   PHURINE 5.5 07/17/2011 1252   GLUCOSEU Negative 12/18/2019 1407   GLUCOSEU Negative 04/07/2012 2301   HGBUR 1+ 04/07/2012 2301   HGBUR NEGATIVE 07/17/2011 1252   BILIRUBINUR Negative 12/18/2019 1407   BILIRUBINUR Negative 04/07/2012 2301   KETONESUR negative 02/05/2016 1053   KETONESUR Negative 04/07/2012 2301   KETONESUR NEGATIVE 07/17/2011 1252   PROTEINUR Negative 12/18/2019 1407   PROTEINUR Negative 04/07/2012 2301   PROTEINUR NEGATIVE 07/17/2011 1252   UROBILINOGEN 0.2 02/05/2016 1053   UROBILINOGEN 0.2 07/17/2011 1252   NITRITE Negative 12/18/2019 1407   NITRITE Negative 04/07/2012 2301   NITRITE NEGATIVE 07/17/2011 1252   LEUKOCYTESUR Negative 12/18/2019 1407   LEUKOCYTESUR Negative 04/07/2012 2301  I have reviewed the labs.   Pertinent Imaging: *** Assessment & Plan:  ***  1. BPH with LUTS -PVR < 300 cc *** -symptoms - *** -most bothersome symptoms are incomplete bladder emptying  -continue conservative management,  avoiding bladder irritants and timed voiding's -Initiate alpha-blocker (***), discussed side effects *** -Initiate 5 alpha reductase inhibitor (***), discussed side effects *** -Continue tamsulosin 0.4 mg daily, alfuzosin 10 mg daily, Rapaflo 8 mg daily, terazosin, doxazosin, Cialis 5 mg daily and finasteride 5 mg daily, dutasteride 0.5 mg daily***:refills given -Cannot tolerate medication or medication failure, schedule cystoscopy ***  2. Incomplete bladder emptying -PVR ***     No follow-ups on file.  These notes generated with voice recognition software. I apologize for typographical errors.  [place], [place] [address]  [place], [place] [address] [phone]

## 2021-09-28 ENCOUNTER — Ambulatory Visit: Payer: Medicare PPO | Admitting: Urology

## 2021-09-28 DIAGNOSIS — R339 Retention of urine, unspecified: Secondary | ICD-10-CM

## 2021-09-28 DIAGNOSIS — N138 Other obstructive and reflux uropathy: Secondary | ICD-10-CM

## 2021-10-03 ENCOUNTER — Telehealth: Payer: Self-pay | Admitting: Urology

## 2021-10-03 NOTE — Telephone Encounter (Signed)
Would you call [name] and have him reschedule his appointment from September 20 that he missed?  We need to keep a close eye on his PVRs as they are greater than 300 and we do not want to risk kidney damage.

## 2021-10-03 NOTE — Telephone Encounter (Signed)
Error

## 2021-10-06 NOTE — Telephone Encounter (Signed)
Pt scheduled for 10/11/21, pt confirmed.

## 2021-10-10 NOTE — Progress Notes (Deleted)
10/11/2021 12:15 PM   VIRGINIA FRANCISCO 1940-04-28 [id number]  Referring provider: Virginia Crews, [place],  [place] [address]  Urological history: 1. BPH with lower urinary tract symptoms -I PSS *** -PVR *** -Prior TURP several years ago -On tamsulosin  -Incomplete emptying with PVRs ranging from 130-415; typically in 200 range   2. Bladder calculus -cystolitholapaxy several years ago   3.  Erectile dysfunction -Contributing factors of age, diabetes, BPH, hyperlipidemia, hypertension, smoker, TIAs and alcohol consumption -Sildenafil 60 mg prn   4. Phimosis  No chief complaint on file.   HPI: SHAQUON GROPP is a 81 y.o. male with diabetes who presents today for recheck on his PVR.      Score:  1-7 Mild 8-19 Moderate 20-35 Severe    PMH: Past Medical History:  Diagnosis Date   Arthritis    Knees   Avascular necrosis of medial femoral condyle (HCC)    Chronic prostatitis    Diabetes mellitus ([place])    controlled with medication   Erectile dysfunction    Hearing loss    Hyperlipidemia    Hypertension    controlled with medication   Hypertrophy of prostate without urinary obstruction and other lower urinary tract symptoms (LUTS)    Infection of urinary tract    ASSESSMENT:Recurrent MRSA UTI F/B Dr. Clayborn Bigness and Dr. Jacqlyn Larsen   TIA (transient ischemic attack) 02/23/2014   R sided weakness and numbness; duration 1 hour; evaluated [facility].   Tobacco use disorder     Surgical History: Past Surgical History:  Procedure Laterality Date   Admission  03/09/2012   [place]; 24 hour admission; LE numbness/tingling.  CT head, MRI brain, carotid dopplers, 2D-echo.   AMPUTATION  2009   L fifth finger surgery for near amputation. [place].   BLADDER STONE REMOVAL     EYE SURGERY  08/09/2013   B cataract surgery; [place]  04/09/2013   3 repaired; [facility].   JOINT REPLACEMENT     PARTIAL KNEE ARTHROPLASTY  07/24/2011   Procedure:  UNICOMPARTMENTAL KNEE;  Surgeon: Lorn Junes, MD;  Location: [facility];  Service: Orthopedics;  Laterality: Left;   PROSTATE SURGERY     RETINAL TEAR REPAIR CRYOTHERAPY  08/09/2013   [name]    Home Medications:  Allergies as of 10/11/2021   No Known Allergies      Medication List        Accurate as of October 10, 2021 12:15 PM. If you have any questions, ask your nurse or doctor.          Accu-Chek Compact Plus Control Soln Test blood sugar daily. Dx code: E11.9   aspirin EC 81 MG tablet Take 81 mg by mouth daily.   blood glucose meter kit and supplies Kit Test blood sugar daily. Dx code: E11.9   blood glucose meter kit and supplies Kit Dispense based on patient and insurance preference. Check sugar once daily as directed. (FOR ICD-9 250.00, 250.01).   clopidogrel 75 MG tablet Commonly known as: PLAVIX TAKE 1 TABLET(75 MG) BY MOUTH DAILY. Please schedule office visit before any future refill.   gabapentin 300 MG capsule Commonly known as: NEURONTIN TAKE 1 CAPSULE(300 MG) BY MOUTH AT BEDTIME. Please schedule office visit before any future refills   glucose blood test strip Test blood sugar daily. Dx Code: E11.9   hydrochlorothiazide 12.5 MG tablet Commonly known as: HYDRODIURIL TAKE 1 TABLET(12.5 MG) BY MOUTH DAILY. Please schedule office  visit before any future refill.   Lancets Misc Test blood sugar daily. Dx code: E11.9   lisinopril 40 MG tablet Commonly known as: ZESTRIL Take 1 tablet (40 mg total) by mouth daily.   lovastatin 40 MG tablet Commonly known as: MEVACOR Take 1 tablet (40 mg total) by mouth at bedtime.   sildenafil 20 MG tablet Commonly known as: REVATIO TAKE 2 TO 5 TABLETS BY MOUTH 1 HOUR BEFORE SEXUAL ACTIVITY   tamsulosin 0.4 MG Caps capsule Commonly known as: FLOMAX TAKE 1 CAPSULE(0.4 MG) BY MOUTH DAILY AFTER BREAKFAST        Allergies: No Known Allergies  Family History: Family History  Problem Relation Age of Onset    Hypertension Mother    Cancer Mother        lung   Heart attack Father    Hypertension Father    Hyperlipidemia Father    Aneurysm Father 45       Brain   Heart disease Father 51       AMI as cause of death   Diabetes Son    Healthy Sister     Social History:  reports that he quit smoking about 35 years ago. His smoking use included cigarettes. He has a 10.00 pack-year smoking history. His smokeless tobacco use includes chew. He reports current alcohol use of about 8.0 - 10.0 standard drinks of alcohol per week. He reports that he does not use drugs.  ROS: Pertinent ROS in HPI  Physical Exam: There were no vitals taken for this visit.  Constitutional:  Well nourished. Alert and oriented, No acute distress. HEENT: [place] AT, moist mucus membranes.  Trachea midline, no masses. Cardiovascular: No clubbing, cyanosis, or edema. Respiratory: Normal respiratory effort, no increased work of breathing. GI: Abdomen is soft, non tender, non distended, no abdominal masses. Liver and spleen not palpable.  No hernias appreciated.  Stool sample for occult testing is not indicated.   GU: No CVA tenderness.  No bladder fullness or masses.  Patient with circumcised/uncircumcised phallus. ***Foreskin easily retracted***  Urethral meatus is patent.  No penile discharge. No penile lesions or rashes. Scrotum without lesions, cysts, rashes and/or edema.  Testicles are located scrotally bilaterally. No masses are appreciated in the testicles. Left and right epididymis are normal. Rectal: Patient with  normal sphincter tone. Anus and perineum without scarring or rashes. No rectal masses are appreciated. Prostate is approximately *** grams, *** nodules are appreciated. Seminal vesicles are normal. Skin: No rashes, bruises or suspicious lesions. Lymph: No cervical or inguinal adenopathy. Neurologic: Grossly intact, no focal deficits, moving all 4 extremities. Psychiatric: Normal mood and affect.  Laboratory  Data: Lab Results  Component Value Date   WBC 5.0 08/02/2021   HGB 17.4 08/02/2021   HCT 50.8 08/02/2021   MCV 97 08/02/2021   PLT 184 08/02/2021    Lab Results  Component Value Date   CREATININE 0.90 08/02/2021    Lab Results  Component Value Date   HGBA1C 5.3 08/02/2021       Component Value Date/Time   CHOL 162 08/02/2021 1057   CHOL 177 04/09/2012 0432   HDL 53 08/02/2021 1057   HDL 30 (L) 04/09/2012 0432   CHOLHDL 3.1 08/02/2021 1057   CHOLHDL 6.0 (H) 06/15/2015 1336   VLDL 72 (H) 06/15/2015 1336   VLDL 45 (H) 04/09/2012 0432   LDLCALC 95 08/02/2021 1057   LDLCALC 102 (H) 04/09/2012 0432    Lab Results  Component Value Date  AST 27 08/02/2021   Lab Results  Component Value Date   ALT 16 08/02/2021    Urinalysis    Component Value Date/Time   COLORURINE Yellow 04/07/2012 2301   COLORURINE YELLOW 07/17/2011 1252   APPEARANCEUR Clear 12/18/2019 1407   LABSPEC 1.012 04/07/2012 2301   PHURINE 5.0 04/07/2012 2301   PHURINE 5.5 07/17/2011 1252   GLUCOSEU Negative 12/18/2019 1407   GLUCOSEU Negative 04/07/2012 2301   HGBUR 1+ 04/07/2012 2301   HGBUR NEGATIVE 07/17/2011 1252   BILIRUBINUR Negative 12/18/2019 1407   BILIRUBINUR Negative 04/07/2012 2301   KETONESUR negative 02/05/2016 1053   KETONESUR Negative 04/07/2012 2301   KETONESUR NEGATIVE 07/17/2011 1252   PROTEINUR Negative 12/18/2019 1407   PROTEINUR Negative 04/07/2012 2301   PROTEINUR NEGATIVE 07/17/2011 1252   UROBILINOGEN 0.2 02/05/2016 1053   UROBILINOGEN 0.2 07/17/2011 1252   NITRITE Negative 12/18/2019 1407   NITRITE Negative 04/07/2012 2301   NITRITE NEGATIVE 07/17/2011 1252   LEUKOCYTESUR Negative 12/18/2019 1407   LEUKOCYTESUR Negative 04/07/2012 2301  I have reviewed the labs.   Pertinent Imaging: *** Assessment & Plan:  ***  1. BPH with LUTS -PVR < 300 cc *** -symptoms - *** -most bothersome symptoms are incomplete bladder emptying  -continue conservative management,  avoiding bladder irritants and timed voiding's -Initiate alpha-blocker (***), discussed side effects *** -Initiate 5 alpha reductase inhibitor (***), discussed side effects *** -Continue tamsulosin 0.4 mg daily, alfuzosin 10 mg daily, Rapaflo 8 mg daily, terazosin, doxazosin, Cialis 5 mg daily and finasteride 5 mg daily, dutasteride 0.5 mg daily***:refills given -Cannot tolerate medication or medication failure, schedule cystoscopy ***  2. Incomplete bladder emptying -PVR ***     No follow-ups on file.  These notes generated with voice recognition software. I apologize for typographical errors.  [place], [place] [address]  [place], [place] [address] [phone]

## 2021-10-11 ENCOUNTER — Ambulatory Visit: Payer: Medicare PPO | Admitting: Urology

## 2021-10-11 ENCOUNTER — Encounter: Payer: Self-pay | Admitting: Urology

## 2021-10-31 ENCOUNTER — Other Ambulatory Visit: Payer: Self-pay | Admitting: Family Medicine

## 2021-10-31 DIAGNOSIS — I152 Hypertension secondary to endocrine disorders: Secondary | ICD-10-CM

## 2021-12-09 ENCOUNTER — Other Ambulatory Visit: Payer: Self-pay | Admitting: Urology

## 2021-12-30 ENCOUNTER — Telehealth: Payer: Self-pay | Admitting: Family Medicine

## 2021-12-30 NOTE — Telephone Encounter (Signed)
Left message for patient to call back and schedule Medicare Annual Wellness Visit (AWV) in office.   If not able to come in office, please offer to do virtually or by telephone.  Left office number and my jabber [phone].  Last AWV:12/01/2020   Please schedule at anytime with Nurse Health Advisor.

## 2022-01-13 ENCOUNTER — Telehealth: Payer: Self-pay | Admitting: Family Medicine

## 2022-01-13 NOTE — Telephone Encounter (Signed)
Left message for patient to call back and schedule Medicare Annual Wellness Visit (AWV) in office.   If not able to come in office, please offer to do virtually or by telephone.  Left office number and my jabber [phone].  Last AWV:12/01/2020   Please schedule at anytime with Nurse Health Advisor.

## 2022-03-04 ENCOUNTER — Other Ambulatory Visit: Payer: Self-pay

## 2022-03-04 ENCOUNTER — Emergency Department
Admission: EM | Admit: 2022-03-04 | Discharge: 2022-03-04 | Disposition: A | Discharge status: Home / Self Care | Payer: Medicare PPO | Attending: Emergency Medicine | Admitting: Emergency Medicine

## 2022-03-04 ENCOUNTER — Emergency Department: Payer: Medicare PPO

## 2022-03-04 DIAGNOSIS — W01198A Fall on same level from slipping, tripping and stumbling with subsequent striking against other object, initial encounter: Secondary | ICD-10-CM | POA: Diagnosis not present

## 2022-03-04 DIAGNOSIS — E119 Type 2 diabetes mellitus without complications: Secondary | ICD-10-CM | POA: Insufficient documentation

## 2022-03-04 DIAGNOSIS — S0990XA Unspecified injury of head, initial encounter: Secondary | ICD-10-CM

## 2022-03-04 DIAGNOSIS — W19XXXA Unspecified fall, initial encounter: Secondary | ICD-10-CM

## 2022-03-04 DIAGNOSIS — F10129 Alcohol abuse with intoxication, unspecified: Secondary | ICD-10-CM | POA: Diagnosis not present

## 2022-03-04 DIAGNOSIS — I10 Essential (primary) hypertension: Secondary | ICD-10-CM | POA: Insufficient documentation

## 2022-03-04 DIAGNOSIS — Y907 Blood alcohol level of 200-239 mg/100 ml: Secondary | ICD-10-CM | POA: Insufficient documentation

## 2022-03-04 DIAGNOSIS — F10920 Alcohol use, unspecified with intoxication, uncomplicated: Secondary | ICD-10-CM | POA: Insufficient documentation

## 2022-03-04 DIAGNOSIS — S0181XA Laceration without foreign body of other part of head, initial encounter: Secondary | ICD-10-CM | POA: Insufficient documentation

## 2022-03-04 DIAGNOSIS — S01412A Laceration without foreign body of left cheek and temporomandibular area, initial encounter: Secondary | ICD-10-CM | POA: Diagnosis not present

## 2022-03-04 DIAGNOSIS — R58 Hemorrhage, not elsewhere classified: Secondary | ICD-10-CM | POA: Diagnosis not present

## 2022-03-04 DIAGNOSIS — Z8673 Personal history of transient ischemic attack (TIA), and cerebral infarction without residual deficits: Secondary | ICD-10-CM | POA: Diagnosis not present

## 2022-03-04 DIAGNOSIS — Z23 Encounter for immunization: Secondary | ICD-10-CM | POA: Diagnosis not present

## 2022-03-04 DIAGNOSIS — Z7901 Long term (current) use of anticoagulants: Secondary | ICD-10-CM | POA: Insufficient documentation

## 2022-03-04 DIAGNOSIS — R410 Disorientation, unspecified: Secondary | ICD-10-CM | POA: Diagnosis not present

## 2022-03-04 DIAGNOSIS — R0902 Hypoxemia: Secondary | ICD-10-CM | POA: Diagnosis not present

## 2022-03-04 DIAGNOSIS — T1490XA Injury, unspecified, initial encounter: Secondary | ICD-10-CM | POA: Diagnosis not present

## 2022-03-04 LAB — CBC
HCT: 50.2 % (ref 39.0–52.0)
Hemoglobin: 16.8 g/dL (ref 13.0–17.0)
MCH: 31.6 pg (ref 26.0–34.0)
MCHC: 33.5 g/dL (ref 30.0–36.0)
MCV: 94.5 fL (ref 80.0–100.0)
Platelets: 156 10*3/uL (ref 150–400)
RBC: 5.31 MIL/uL (ref 4.22–5.81)
RDW: 13.4 % (ref 11.5–15.5)
WBC: 6.3 10*3/uL (ref 4.0–10.5)
nRBC: 0 % (ref 0.0–0.2)

## 2022-03-04 LAB — BASIC METABOLIC PANEL
Anion gap: 9 (ref 5–15)
BUN: 6 mg/dL — ABNORMAL LOW (ref 8–23)
CO2: 27 mmol/L (ref 22–32)
Calcium: 8.6 mg/dL — ABNORMAL LOW (ref 8.9–10.3)
Chloride: 95 mmol/L — ABNORMAL LOW (ref 98–111)
Creatinine, Ser: 0.68 mg/dL (ref 0.61–1.24)
GFR, Estimated: >60 mL/min (ref >60)
Glucose, Bld: 102 mg/dL — ABNORMAL HIGH (ref 70–99)
Potassium: 3.8 mmol/L (ref 3.5–5.1)
Sodium: 131 mmol/L — ABNORMAL LOW (ref 135–145)

## 2022-03-04 LAB — TROPONIN I (HIGH SENSITIVITY)
Troponin I (High Sensitivity): 10 ng/L (ref <18)
Troponin I (High Sensitivity): 11 ng/L (ref <18)

## 2022-03-04 LAB — ETHANOL: Alcohol, Ethyl (B): 211 mg/dL — ABNORMAL HIGH (ref <10)

## 2022-03-04 MED ORDER — TETANUS-DIPHTH-ACELL PERTUSSIS 5-2.5-18.5 LF-MCG/0.5 IM SUSY
0.5000 mL | PREFILLED_SYRINGE | Freq: Once | INTRAMUSCULAR | Status: AC
  Administered 2022-03-04: 0.5 mL via INTRAMUSCULAR
  Filled 2022-03-04: qty 0.5

## 2022-03-04 MED ORDER — LIDOCAINE-EPINEPHRINE 2 %-1:100000 IJ SOLN
5.0000 mL | Freq: Once | INTRAMUSCULAR | Status: AC
  Administered 2022-03-04: 5 mL
  Filled 2022-03-04: qty 1

## 2022-03-04 MED ORDER — FENTANYL CITRATE PF 50 MCG/ML IJ SOSY: 50.0000 ug | PREFILLED_SYRINGE | Freq: Once | INTRAMUSCULAR | Status: DC

## 2022-03-04 NOTE — ED Triage Notes (Signed)
[facility] [place] coming from the bar due to a fall. Pt does not remember the fall. Pt is on ASA. ETOH today, drank about 5 beers today.

## 2022-03-04 NOTE — ED Notes (Signed)
Suture cart, tray and Lidocaine at bedside. Provider notified.
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
## 2022-03-04 NOTE — ED Notes (Signed)
Spoke with Merry Proud (son) on the phone and gave him and update. Merry Proud states that we just need to call him when it is time for discharge and he will pick up the patient.

## 2022-03-04 NOTE — Discharge Instructions (Signed)
Take acetaminophen 650 mg and ibuprofen 400 mg every 6 hours for pain.  Take with food. Please keep your wound clean by washing at least daily with soap and water. If you see any signs of infection like spreading redness, pus coming from the wound, extreme pain, fevers, chills or any other worsening doctor right away or come back to the emergency department  Your stitches should fall out in 5 to 7 days.  Thank you for choosing [place] for your health care today!  Please see your primary doctor this week for a follow up appointment.   Sometimes, in the early stages of certain disease courses it is difficult to detect in the emergency department evaluation -- so, it is important that you continue to monitor your symptoms and call your doctor right away or return to the emergency department if you develop any new or worsening symptoms.  Please go to the following website to schedule new (and existing) patient appointments:   [email or web address]  If you do not have a primary doctor try calling the following clinics to establish care:  If you have insurance:  [facility] [place] [phone] [place] [address]   [facility]  [phone] [address] [place] [address]   If you do not have insurance:  [facility]  [phone] [address] [place] [address]   The following is another list of primary care offices in the area who are accepting new patients at this time.  Please reach out to one of them directly and let them know you would like to schedule an appointment to follow up on an Emergency Department visit, and/or to establish a new primary care provider (PCP).  There are likely other primary care clinics in the are who are accepting new patients, but this is an excellent place to start:  [facility] physician: Dr Lavon Paganini [address] [place],  [place] [address] [phone]  [facility] Lead Physician: Dr Steele Sizer [address], [place], [place] [address] [phone]  [facility] Physician: Dr Park Liter [address] [place], [place] [address] [phone]  [facility] Lead Physician: Dr Dewaine Oats [place], [place], [place] [address] [phone]  [facility] Physician: Dr Halina Maidens [address] [place],  [address] [phone]   It was my pleasure to care for you today.   Hoover Brunette Jacelyn Grip, MD

## 2022-03-10 ENCOUNTER — Telehealth: Payer: Self-pay | Admitting: *Deleted

## 2022-03-10 NOTE — Telephone Encounter (Signed)
     Patient  visit on 03/04/2022  at [facility] was for fall  Have you been able to follow up with your primary care physician? patient feeling better does not wish to follow up with dr as he says the fall has resoved  The patient was  able to obtain any needed medicine or equipment.  Are there diet recommendations that you are having difficulty following?  Patient expresses understanding of discharge instructions and education provided has no other needs at this time.   [facility] [phone] [address] , [place] [address] Email : [email or web address]

## 2022-06-14 ENCOUNTER — Other Ambulatory Visit: Payer: Self-pay

## 2022-06-14 ENCOUNTER — Emergency Department
Admission: EM | Admit: 2022-06-14 | Discharge: 2022-06-14 | Disposition: A | Discharge status: Home / Self Care | Payer: Medicare PPO | Attending: Emergency Medicine | Admitting: Emergency Medicine

## 2022-06-14 ENCOUNTER — Emergency Department: Payer: Medicare PPO

## 2022-06-14 DIAGNOSIS — R209 Unspecified disturbances of skin sensation: Secondary | ICD-10-CM | POA: Diagnosis present

## 2022-06-14 DIAGNOSIS — R202 Paresthesia of skin: Secondary | ICD-10-CM | POA: Diagnosis not present

## 2022-06-14 DIAGNOSIS — I10 Essential (primary) hypertension: Secondary | ICD-10-CM | POA: Diagnosis not present

## 2022-06-14 DIAGNOSIS — R200 Anesthesia of skin: Secondary | ICD-10-CM | POA: Diagnosis not present

## 2022-06-14 DIAGNOSIS — G319 Degenerative disease of nervous system, unspecified: Secondary | ICD-10-CM | POA: Diagnosis not present

## 2022-06-14 LAB — CBC WITH DIFFERENTIAL/PLATELET
Abs Immature Granulocytes: 0.02 10*3/uL (ref 0.00–0.07)
Basophils Absolute: 0 10*3/uL (ref 0.0–0.1)
Basophils Relative: 1 %
Eosinophils Absolute: 0.2 10*3/uL (ref 0.0–0.5)
Eosinophils Relative: 3 %
HCT: 50.9 % (ref 39.0–52.0)
Hemoglobin: 17.1 g/dL — ABNORMAL HIGH (ref 13.0–17.0)
Immature Granulocytes: 0 %
Lymphocytes Relative: 17 %
Lymphs Abs: 1 10*3/uL (ref 0.7–4.0)
MCH: 32 pg (ref 26.0–34.0)
MCHC: 33.6 g/dL (ref 30.0–36.0)
MCV: 95.3 fL (ref 80.0–100.0)
Monocytes Absolute: 0.6 10*3/uL (ref 0.1–1.0)
Monocytes Relative: 10 %
Neutro Abs: 4.4 10*3/uL (ref 1.7–7.7)
Neutrophils Relative %: 69 %
Platelets: 156 10*3/uL (ref 150–400)
RBC: 5.34 MIL/uL (ref 4.22–5.81)
RDW: 13.2 % (ref 11.5–15.5)
WBC: 6.3 10*3/uL (ref 4.0–10.5)
nRBC: 0 % (ref 0.0–0.2)

## 2022-06-14 LAB — BASIC METABOLIC PANEL
Anion gap: 10 (ref 5–15)
BUN: 9 mg/dL (ref 8–23)
CO2: 28 mmol/L (ref 22–32)
Calcium: 9.4 mg/dL (ref 8.9–10.3)
Chloride: 101 mmol/L (ref 98–111)
Creatinine, Ser: 0.73 mg/dL (ref 0.61–1.24)
GFR, Estimated: >60 mL/min (ref >60)
Glucose, Bld: 101 mg/dL — ABNORMAL HIGH (ref 70–99)
Potassium: 4.1 mmol/L (ref 3.5–5.1)
Sodium: 139 mmol/L (ref 135–145)

## 2022-06-14 MED ORDER — MIDAZOLAM HCL 2 MG/2ML IJ SOLN
1.0000 mg | Freq: Once | INTRAMUSCULAR | Status: AC
  Administered 2022-06-14: 1 mg via INTRAVENOUS
  Filled 2022-06-14: qty 2

## 2022-06-14 NOTE — Discharge Instructions (Signed)
As we discussed, your evaluation tonight was reassuring with no obvious explanation for the numbness to your right shin, but there is no evidence that you have had a stroke or have any other acute or emergent medical abnormality.  We recommend that you follow-up with your primary care doctor at the next available opportunity.  Your blood pressure was also elevated tonight, but we will not make any changes to your medications at the moment and recommend that you follow-up with your regular doctor to discuss whether adjustments to your medication are needed or whether this was a one-time event of uncontrolled hypertension (high blood pressure).    Return to the emergency department if you develop new or worsening symptoms that concern you.

## 2022-06-14 NOTE — ED Provider Notes (Signed)
[facility] Provider Note    Event Date/Time   First MD Initiated Contact with Patient 06/14/22 [phone]     (approximate)   History   Numbness   HPI Todd Diaz is a 82 y.o. male who presents by EMS for evaluation of right lower extremity numbness.  He said that he feels numb with decreased sensation from the right knee down to his foot.  He was vague about the onset of the symptoms, but eventually he stated that he first started noticing it around 4 PM, which was approximately 12 hours ago.  He said that it seemed to be worse when he called 911 tonight.  He had not yet been asleep.  He denies any weakness in his arms or his legs, just has the decreased sensation.  The patient states that he drinks alcohol almost every night but has not had any tonight.  He said that he has not had any recent fever, chest pain, nor shortness of breath.  He claims he has been taking his regular medications as prescribed but has not seen his regular doctor for about a year.  He claims that he had a fall recently but he is not sure when, at least a couple of weeks ago.       Physical Exam   Triage Vital Signs: ED Triage Vitals  Enc Vitals Group     BP 06/14/22 0345 (!) 168/103     Pulse Rate 06/14/22 0345 91     Resp 06/14/22 0345 (!) 23     Temp 06/14/22 0345 98.2 F (36.8 C)     Temp Source 06/14/22 0345 Oral     SpO2 --      Weight --      Height --      Head Circumference --      Peak Flow --      Pain Score 06/14/22 0344 0     Pain Loc --      Pain Edu? --      Excl. in [place]? --     Most recent vital signs: Vitals:   06/14/22 0430 06/14/22 0530  BP: (!) 173/87 (!) 172/98  Pulse: 83 81  Resp: 17 16  Temp:      General: Awake, alert, appears younger than chronological age but is somewhat disheveled. CV:  Good peripheral perfusion.  Regular rate and rhythm. Resp:  Normal effort. Speaking easily and comfortably, no accessory muscle usage nor intercostal  retractions.   Abd:  No distention.  No tenderness to palpation of the abdomen. Other:  Patient has normal and equal grip strength bilaterally and normal arm and leg strength.  He reports a subjective decrease in sensation to light touch from his knee down to his ankle on the right side, anterior part of the leg, but he responded with a grimace when I elicited a painful stimulus to the leg.  He has no dysarthria nor aphasia and seems awake and alert, a vague historian but answers appropriately with a normal mood and affect.  NIH stroke scale 1 for decreased subjective light touch sensation.   ED Results / Procedures / Treatments   Labs (all labs ordered are listed, but only abnormal results are displayed) Labs Reviewed  BASIC METABOLIC PANEL - Abnormal; Notable for the following components:      Result Value   Glucose, Bld 101 (*)    All other components within normal limits  CBC WITH DIFFERENTIAL/PLATELET - Abnormal; Notable  for the following components:   Hemoglobin 17.1 (*)    All other components within normal limits     EKG  ED ECG REPORT I, Loleta Rose, the attending physician, personally viewed and interpreted this ECG.  Date: 06/14/2022 EKG Time: 3:41 AM Rate: 89 Rhythm: normal sinus rhythm QRS Axis: normal Intervals: normal ST/T Wave abnormalities: normal Narrative Interpretation: no evidence of acute ischemia    RADIOLOGY I viewed and interpreted the patient's MRI of the brain.  I see no obvious areas to suggest a stroke.  The radiologist confirmed no evidence of acute abnormality including CVA.   PROCEDURES:  Critical Care performed: No  .1-3 Lead EKG Interpretation  Performed by: Loleta [place], MD Authorized by: Loleta [place], MD     Interpretation: normal     ECG rate:  81   ECG rate assessment: normal     Rhythm: sinus rhythm     Ectopy: none     Conduction: normal       IMPRESSION / MDM / ASSESSMENT AND PLAN / ED COURSE  I reviewed the triage  vital signs and the nursing notes.                              Differential diagnosis includes, but is not limited to, CVA, neuropathy, perfusion deficit, electrolyte or metabolic abnormality.  Patient's presentation is most consistent with acute presentation with potential threat to life or bodily function.  Labs/studies ordered: EKG, MR brain, basic metabolic panel, CBC with differential  Interventions/Medications given:  Medications  midazolam (VERSED) injection 1 mg (1 mg Intravenous Given 06/14/22 0457)    (Note:  hospital course my include additional interventions and/or labs/studies not listed above.)   Patient's symptoms of numbness in his right lower leg without weakness have been present for at least 12 hours.  He does not meet criteria for a code stroke and I strongly doubt CVA as the cause of his symptoms regardless.  However, given his comorbidities and being somewhat of a vague historian, I will proceed with MR brain to rule out acute CVA.  He has an overall reassuring exam and his BMP and CBC with differential are all within normal limits.  He has no focal neurological deficits on exam other than the subjective decreased light touch sensation on the right lower leg.  Of note, however, although the patient's vital signs are otherwise normal, he was quite hypertensive upon arrival, reaching a peak of 187/132, but then coming down to 172/98.  I doubt that this has to do with his current presentation or symptoms, unless he is having an acute CVA and this is compensatory hypertension.  However the patient states that he has been taking his regular medications though he admits he has not seen his PCP in quite a while.  I will hold off on any intervention currently and then talk with him about how to proceed.  The patient is on the cardiac monitor to evaluate for evidence of arrhythmia and/or significant heart rate changes.   Clinical Course as of 06/14/22 0544  Wed Jun 14, 2022   0542 As documented above, the patient's MRI was normal with no evidence of CVA.  The patient required a dose of Versed 1 mg IV in order to alleviate his anxiety over getting the MRI.  He is currently somnolent though awakens easily to voice.  I talked with him about the results and he will be discharged for  close outpatient follow-up.  We talked about the need to follow-up for his blood pressure and I will not change his medications at this time in favor of guidance from his PCP.  He understands and agrees with the plan and I gave my usual return precautions. [CF]    Clinical Course User Index [CF] Loleta Rose, MD     FINAL CLINICAL IMPRESSION(S) / ED DIAGNOSES   Final diagnoses:  Paresthesia  Uncontrolled hypertension     Rx / DC Orders   ED Discharge Orders     None        Note:  This document was prepared using Dragon voice recognition software and may include unintentional dictation errors.   Loleta Rose, MD 06/14/22 [phone]

## 2022-06-14 NOTE — ED Triage Notes (Signed)
Pt BIB EMS for c/o RLE numbness--this is only in his RLE, VAN neg, equal strength bilaterally Pt does have hx/o stroke Symptoms started approximately 1 hour PTA

## 2022-06-20 ENCOUNTER — Telehealth: Payer: Self-pay

## 2022-06-20 NOTE — Telephone Encounter (Signed)
Transition Care Management Unsuccessful Follow-up Telephone Call  Date of discharge and from where:  06/14/2022 [facility]  Attempts:  1st Attempt  Reason for unsuccessful TCM follow-up call:  Left voice message  Wane Mollett [facility]  [facility] Population Health Community Resource Care Guide   [email or web address]  ?? [phone]   Website: [email or web address]  [email or web address]

## 2022-06-21 ENCOUNTER — Telehealth: Payer: Self-pay

## 2022-06-21 NOTE — Telephone Encounter (Signed)
Transition Care Management Follow-up Telephone Call Date of discharge and from where: 06/14/2022 [facility] How have you been since you were released from the hospital? Patient declined to participate.  Todd Diaz [facility]  [facility] Population Health Community Resource Care Guide   [email or web address]  ?? [phone]   Website: [email or web address]  [email or web address]

## 2022-08-03 ENCOUNTER — Ambulatory Visit: Payer: Medicare PPO | Admitting: Urology

## 2022-08-04 ENCOUNTER — Encounter: Payer: Self-pay | Admitting: Urology

## 2022-11-30 ENCOUNTER — Encounter: Payer: Self-pay | Admitting: Physician Assistant

## 2022-11-30 ENCOUNTER — Ambulatory Visit (INDEPENDENT_AMBULATORY_CARE_PROVIDER_SITE_OTHER): Payer: Medicare PPO | Admitting: Physician Assistant

## 2022-11-30 ENCOUNTER — Telehealth: Payer: Self-pay

## 2022-11-30 VITALS — BP 159/94 | HR 76 | Resp 12 | Ht 72.0 in | Wt 160.7 lb

## 2022-11-30 DIAGNOSIS — I152 Hypertension secondary to endocrine disorders: Secondary | ICD-10-CM | POA: Diagnosis not present

## 2022-11-30 DIAGNOSIS — Z111 Encounter for screening for respiratory tuberculosis: Secondary | ICD-10-CM

## 2022-11-30 DIAGNOSIS — F410 Panic disorder [episodic paroxysmal anxiety] without agoraphobia: Secondary | ICD-10-CM

## 2022-11-30 DIAGNOSIS — R4189 Other symptoms and signs involving cognitive functions and awareness: Secondary | ICD-10-CM

## 2022-11-30 DIAGNOSIS — I10 Essential (primary) hypertension: Secondary | ICD-10-CM

## 2022-11-30 DIAGNOSIS — E785 Hyperlipidemia, unspecified: Secondary | ICD-10-CM

## 2022-11-30 DIAGNOSIS — E1169 Type 2 diabetes mellitus with other specified complication: Secondary | ICD-10-CM

## 2022-11-30 DIAGNOSIS — E1159 Type 2 diabetes mellitus with other circulatory complications: Secondary | ICD-10-CM | POA: Diagnosis not present

## 2022-11-30 DIAGNOSIS — N401 Enlarged prostate with lower urinary tract symptoms: Secondary | ICD-10-CM | POA: Diagnosis not present

## 2022-11-30 DIAGNOSIS — N138 Other obstructive and reflux uropathy: Secondary | ICD-10-CM

## 2022-11-30 DIAGNOSIS — F411 Generalized anxiety disorder: Secondary | ICD-10-CM | POA: Diagnosis not present

## 2022-11-30 MED ORDER — HYDROCHLOROTHIAZIDE 12.5 MG PO TABS
ORAL_TABLET | ORAL | 1 refills | Status: AC
Start: 2022-11-30 — End: ?

## 2022-11-30 MED ORDER — HYDROCHLOROTHIAZIDE 12.5 MG PO TABS
ORAL_TABLET | ORAL | 1 refills | Status: DC
Start: 2022-11-30 — End: 2022-11-30

## 2022-11-30 MED ORDER — LISINOPRIL 40 MG PO TABS
40.0000 mg | ORAL_TABLET | Freq: Every day | ORAL | 1 refills | Status: AC
Start: 2022-11-30 — End: ?

## 2022-11-30 MED ORDER — LOVASTATIN 40 MG PO TABS
40.0000 mg | ORAL_TABLET | Freq: Every day | ORAL | 3 refills | Status: AC
Start: 2022-11-30 — End: ?

## 2022-11-30 NOTE — Progress Notes (Signed)
Established patient visit  Patient: Todd Diaz   DOB: Nov 18, 1940   82 y.o. Male  MRN: [id number] Visit Date: 11/30/2022  Today's healthcare provider: Debera Lat, PA-C   Chief Complaint  Patient presents with   Medical Management of Chronic Issues    Memory problems    Subjective    History of Present Illness       Patient previsit and to our clinic with chief complaints of memory problems     11/30/2022    3:02 PM 07/11/2017    3:05 PM  6CIT Screen  What Year? 4 points 0 points  What month? 0 points 0 points  What time? 3 points 0 points  Count back from 20 0 points 0 points  Months in reverse 4 points 2 points  Repeat phrase 10 points 4 points  Total Score 21 points 6 points    Patient is not sure of his location. He lost his home and has been homeless for some time. He stopped taking all medication for 2 or more months. He ended up in a jail. When pt's cognitive decline was recognize, APS got involved.   APS recommends an admission to memory care facility.as pt needs 24/7/care.      08/02/2021   10:20 AM 12/01/2020    8:32 AM 12/01/2020    8:28 AM  Depression screen PHQ 2/9  Decreased Interest 0 0 0  Down, Depressed, Hopeless 0 0 0  PHQ - 2 Score 0 0 0      08/02/2021   10:32 AM  GAD 7 : Generalized Anxiety Score  Nervous, Anxious, on Edge 1  Control/stop worrying 0  Worry too much - different things 0  Trouble relaxing 0  Restless 0  Easily annoyed or irritable 0  Afraid - awful might happen 0  Total GAD 7 Score 1    Medications: Outpatient Medications Prior to Visit  Medication Sig   aspirin EC 81 MG tablet Take 81 mg by mouth daily. (Patient not taking: Reported on 11/30/2022)   Blood Glucose Calibration (ACCU-CHEK COMPACT PLUS CONTROL) SOLN Test blood sugar daily. Dx code: E11.9 (Patient not taking: Reported on 11/30/2022)   blood glucose meter kit and supplies KIT Dispense based on patient and insurance preference. Check sugar once daily  as directed. (FOR ICD-9 250.00, 250.01). (Patient not taking: Reported on 11/30/2022)   Blood Glucose Monitoring Suppl (BLOOD GLUCOSE METER KIT AND SUPPLIES) KIT Test blood sugar daily. Dx code: E11.9 (Patient not taking: Reported on 11/30/2022)   clopidogrel (PLAVIX) 75 MG tablet TAKE 1 TABLET(75 MG) BY MOUTH DAILY. Please schedule office visit before any future refill. (Patient not taking: Reported on 11/30/2022)   gabapentin (NEURONTIN) 300 MG capsule TAKE 1 CAPSULE(300 MG) BY MOUTH AT BEDTIME. Please schedule office visit before any future refills (Patient not taking: Reported on 11/30/2022)   glucose blood test strip Test blood sugar daily. Dx Code: E11.9 (Patient not taking: Reported on 11/30/2022)   Lancets MISC Test blood sugar daily. Dx code: E11.9 (Patient not taking: Reported on 11/30/2022)   sildenafil (REVATIO) 20 MG tablet TAKE 2 TO 5 TABLETS BY MOUTH 1 HOUR BEFORE SEXUAL ACTIVITY (Patient not taking: Reported on 11/30/2022)   tamsulosin (FLOMAX) 0.4 MG CAPS capsule TAKE 1 CAPSULE(0.4 MG) BY MOUTH DAILY AFTER BREAKFAST (Patient not taking: Reported on 11/30/2022)   [DISCONTINUED] hydrochlorothiazide (HYDRODIURIL) 12.5 MG tablet TAKE 1 TABLET(12.5 MG) BY MOUTH DAILY. Please schedule office visit before any future refill. (Patient not taking: Reported on  11/30/2022)   [DISCONTINUED] lisinopril (ZESTRIL) 40 MG tablet TAKE 1 TABLET(40 MG) BY MOUTH DAILY (Patient not taking: Reported on 11/30/2022)   [DISCONTINUED] lovastatin (MEVACOR) 40 MG tablet Take 1 tablet (40 mg total) by mouth at bedtime. (Patient not taking: Reported on 11/30/2022)   No facility-administered medications prior to visit.    Review of Systems Except see HPI       Objective    BP (!) 159/94 (BP Location: Left Arm, Patient Position: Sitting, Cuff Size: Normal)   Pulse 76   Resp 12   Ht 6' (1.829 m)   Wt 160 lb 11.2 oz (72.9 kg)   BMI 21.79 kg/m     Physical Exam   Results for orders placed or performed  in visit on 11/30/22  Hemoglobin A1c  Result Value Ref Range   Hgb A1c MFr Bld 5.7 (H) 4.8 - 5.6 %   Est. average glucose Bld gHb Est-mCnc 117 mg/dL  Lipid panel  Result Value Ref Range   Cholesterol, Total 141 100 - 199 mg/dL   Triglycerides 79 0 - 149 mg/dL   HDL 35 (L) >16 mg/dL   VLDL Cholesterol Cal 16 5 - 40 mg/dL   LDL Chol Calc (NIH) 90 0 - 99 mg/dL   Chol/HDL Ratio 4.0 0.0 - 5.0 ratio  Comprehensive metabolic panel  Result Value Ref Range   Glucose 94 70 - 99 mg/dL   BUN 11 8 - 27 mg/dL   Creatinine, Ser 1.09 0.76 - 1.27 mg/dL   eGFR 85 >60 AV/WUJ/8.11   BUN/Creatinine Ratio 13 10 - 24   Sodium 141 134 - 144 mmol/L   Potassium 4.5 3.5 - 5.2 mmol/L   Chloride 102 96 - 106 mmol/L   CO2 28 20 - 29 mmol/L   Calcium 9.5 8.6 - 10.2 mg/dL   Total Protein 6.7 6.0 - 8.5 g/dL   Albumin 3.8 3.7 - 4.7 g/dL   Globulin, Total 2.9 1.5 - 4.5 g/dL   Bilirubin Total 0.5 0.0 - 1.2 mg/dL   Alkaline Phosphatase 137 (H) 44 - 121 IU/L   AST 32 0 - 40 IU/L   ALT 28 0 - 44 IU/L  CBC with Differential/Platelet  Result Value Ref Range   WBC 5.6 3.4 - 10.8 x10E3/uL   RBC 4.74 4.14 - 5.80 x10E6/uL   Hemoglobin 15.0 13.0 - 17.7 g/dL   Hematocrit 91.4 78.2 - 51.0 %   MCV 93 79 - 97 fL   MCH 31.6 26.6 - 33.0 pg   MCHC 34.1 31.5 - 35.7 g/dL   RDW 95.6 21.3 - 08.6 %   Platelets 136 (L) 150 - 450 x10E3/uL   Neutrophils 68 Not Estab. %   Lymphs 17 Not Estab. %   Monocytes 10 Not Estab. %   Eos 4 Not Estab. %   Basos 1 Not Estab. %   Neutrophils Absolute 3.8 1.4 - 7.0 x10E3/uL   Lymphocytes Absolute 1.0 0.7 - 3.1 x10E3/uL   Monocytes Absolute 0.6 0.1 - 0.9 x10E3/uL   EOS (ABSOLUTE) 0.2 0.0 - 0.4 x10E3/uL   Basophils Absolute 0.0 0.0 - 0.2 x10E3/uL   Immature Granulocytes 0 Not Estab. %   Immature Grans (Abs) 0.0 0.0 - 0.1 x10E3/uL  QuantiFERON-TB Gold Plus  Result Value Ref Range   QuantiFERON Incubation WILL FOLLOW    QuantiFERON Criteria WILL FOLLOW    QuantiFERON TB1 Ag Value WILL  FOLLOW    QuantiFERON TB2 Ag Value WILL FOLLOW    QuantiFERON Nil  Value WILL FOLLOW    QuantiFERON Mitogen Value WILL FOLLOW    QuantiFERON-TB Gold Plus WILL FOLLOW     Assessment & Plan    Hypertension associated with diabetes (HCC) Chronic and unstable Today was 159/94, not at goal Initial workup - Hemoglobin A1c - Lipid panel - Comprehensive metabolic panel - CBC with Differential/Platelet - lisinopril (ZESTRIL) 40 MG tablet; Take 1 tablet (40 mg total) by mouth daily.  Dispense: 90 tablet; Refill: 1 - hydrochlorothiazide (HYDRODIURIL) 12.5 MG tablet; TAKE 1 TABLET(12.5 MG) BY MOUTH DAILY.  Dispense: 90 tablet; Refill: 1 Advised low-salt diet and exercise as tolerated  Type 2 diabetes mellitus with other specified complication, without long-term current use of insulin (HCC) Chronic Not on any medication Workup ordered - Hemoglobin A1c - Lipid panel - Comprehensive metabolic panel - CBC with Differential/Platelet Advised lifestyle modifications Will reassess after receiving lab results  Hyperlipidemia associated with type 2 diabetes mellitus (HCC) Chronic Not on any medications Has been taking Mevacor 40 Mg in the past Ordered - Hemoglobin A1c - Lipid panel - Comprehensive metabolic panel - CBC with Differential/Platelet - lovastatin (MEVACOR) 40 MG tablet; Take 1 tablet (40 mg total) by mouth at bedtime.  Dispense: 90 tablet; Refill: 3 Will reassess  Benign localized hyperplasia of prostate with urinary obstruction On tamsulosin and revatio Managed by urology  Generalized anxiety disorder with panic attacks Unclear if he is currently should be taking any medication Pt declined PHQ9 and GAD7 questionnaires Will Follow-up  Screening-pulmonary TB - QuantiFERON-TB Gold Plus  Cognitive impairment 6 CIT score was 21 Was accompanied by his son and Child psychotherapist who reported that patient was jailed for hit and run. While in jail, he has been wandering around,  unaware of his surroundings, believing that he is in hospital, patient currently staying with his son who cannot provide an appropriate care for his father. Social worker advised for patient to be placed in memory care.  Referral to neurology will be provided if needed. A social worker advised to wait until the placement to long term care facility     Return in about 2 weeks (around 12/14/2022) for BP f/u.     The patient was advised to call back or seek an in-person evaluation if the symptoms worsen or if the condition fails to improve as anticipated.  I discussed the assessment and treatment plan with the patient. The patient was provided an opportunity to ask questions and all were answered. The patient agreed with the plan and demonstrated an understanding of the instructions.  I, Debera Lat, PA-C have reviewed all documentation for this visit. The documentation on  11/30/22  for the exam, diagnosis, procedures, and orders are all accurate and complete.  Debera Lat, [facility], MMS [facility] [phone] (phone) [phone] (fax)  [facility]

## 2022-11-30 NOTE — Telephone Encounter (Signed)
Form signed.and provided to front desk. Please copy make copies.

## 2022-12-01 ENCOUNTER — Telehealth: Payer: Self-pay | Admitting: Family Medicine

## 2022-12-01 NOTE — Telephone Encounter (Signed)
Todd Diaz calling from [place] [facility] is calling to request if the FL-2 can be faxed to her Fax- [phone] CB- [phone]

## 2022-12-01 NOTE — Telephone Encounter (Signed)
Pt's son called in about picking up forms, but he is asking for it to be faxed to Verlon Au the Child psychotherapist at  [phone]

## 2022-12-04 LAB — CBC WITH DIFFERENTIAL/PLATELET
Basophils Absolute: 0 10*3/uL (ref 0.0–0.2)
Basos: 1 %
EOS (ABSOLUTE): 0.2 10*3/uL (ref 0.0–0.4)
Eos: 4 %
Hematocrit: 44 % (ref 37.5–51.0)
Hemoglobin: 15 g/dL (ref 13.0–17.7)
Immature Grans (Abs): 0 10*3/uL (ref 0.0–0.1)
Immature Granulocytes: 0 %
Lymphocytes Absolute: 1 10*3/uL (ref 0.7–3.1)
Lymphs: 17 %
MCH: 31.6 pg (ref 26.6–33.0)
MCHC: 34.1 g/dL (ref 31.5–35.7)
MCV: 93 fL (ref 79–97)
Monocytes Absolute: 0.6 10*3/uL (ref 0.1–0.9)
Monocytes: 10 %
Neutrophils Absolute: 3.8 10*3/uL (ref 1.4–7.0)
Neutrophils: 68 %
Platelets: 136 10*3/uL — ABNORMAL LOW (ref 150–450)
RBC: 4.74 x10E6/uL (ref 4.14–5.80)
RDW: 11.9 % (ref 11.6–15.4)
WBC: 5.6 10*3/uL (ref 3.4–10.8)

## 2022-12-04 LAB — COMPREHENSIVE METABOLIC PANEL
ALT: 28 IU/L (ref 0–44)
AST: 32 IU/L (ref 0–40)
Albumin: 3.8 g/dL (ref 3.7–4.7)
Alkaline Phosphatase: 137 IU/L — ABNORMAL HIGH (ref 44–121)
BUN/Creatinine Ratio: 13 (ref 10–24)
BUN: 11 mg/dL (ref 8–27)
Bilirubin Total: 0.5 mg/dL (ref 0.0–1.2)
CO2: 28 mmol/L (ref 20–29)
Calcium: 9.5 mg/dL (ref 8.6–10.2)
Chloride: 102 mmol/L (ref 96–106)
Creatinine, Ser: 0.86 mg/dL (ref 0.76–1.27)
Globulin, Total: 2.9 g/dL (ref 1.5–4.5)
Glucose: 94 mg/dL (ref 70–99)
Potassium: 4.5 mmol/L (ref 3.5–5.2)
Sodium: 141 mmol/L (ref 134–144)
Total Protein: 6.7 g/dL (ref 6.0–8.5)
eGFR: 85 mL/min/{1.73_m2} (ref >59)

## 2022-12-04 LAB — LIPID PANEL
Chol/HDL Ratio: 4 ratio (ref 0.0–5.0)
Cholesterol, Total: 141 mg/dL (ref 100–199)
HDL: 35 mg/dL — ABNORMAL LOW (ref >39)
LDL Chol Calc (NIH): 90 mg/dL (ref 0–99)
Triglycerides: 79 mg/dL (ref 0–149)
VLDL Cholesterol Cal: 16 mg/dL (ref 5–40)

## 2022-12-04 LAB — QUANTIFERON-TB GOLD PLUS
QuantiFERON Mitogen Value: >10 [IU]/mL
QuantiFERON Nil Value: 0.02 [IU]/mL
QuantiFERON TB1 Ag Value: 0.02 [IU]/mL
QuantiFERON TB2 Ag Value: 0.02 [IU]/mL
QuantiFERON-TB Gold Plus: NEGATIVE

## 2022-12-04 LAB — HEMOGLOBIN A1C
Est. average glucose Bld gHb Est-mCnc: 117 mg/dL
Hgb A1c MFr Bld: 5.7 % — ABNORMAL HIGH (ref 4.8–5.6)

## 2022-12-04 NOTE — Progress Notes (Signed)
Please, let pt's guardian know that his labs are stable for him Except A1C of 5.7. advised low carb diet and daily exercise as tolerated. Except HDL or 35 and LDL of 90. Needs to continue taking his current statin/cholesterol medication. Except AP of 137. Will repeat at the follow-up  Except low platelets of 136. Will repeat at the follow up TB test was negative

## 2022-12-04 NOTE — Telephone Encounter (Signed)
Copied from CRM [id number]. Topic: General - Other >> Dec 01, 2022 10:10 AM Shon Hale wrote: Verlon Au is calling to provide her contact information to be able to pick up the FL2 forms for patient. Please call Verlon Au at [phone]  Verlon Au also states she would like a call once patient's TB test results are available. Patient is needing test as he is going to [facility] facility in [place] on Monday.

## 2022-12-07 ENCOUNTER — Other Ambulatory Visit: Payer: Self-pay

## 2022-12-07 ENCOUNTER — Emergency Department
Admission: EM | Admit: 2022-12-07 | Discharge: 2022-12-07 | Disposition: A | Discharge status: Home / Self Care | Payer: Medicare PPO | Attending: Emergency Medicine | Admitting: Emergency Medicine

## 2022-12-07 DIAGNOSIS — R531 Weakness: Secondary | ICD-10-CM | POA: Insufficient documentation

## 2022-12-07 DIAGNOSIS — I10 Essential (primary) hypertension: Secondary | ICD-10-CM | POA: Insufficient documentation

## 2022-12-07 DIAGNOSIS — E119 Type 2 diabetes mellitus without complications: Secondary | ICD-10-CM | POA: Insufficient documentation

## 2022-12-07 DIAGNOSIS — R6889 Other general symptoms and signs: Secondary | ICD-10-CM | POA: Diagnosis not present

## 2022-12-07 DIAGNOSIS — R42 Dizziness and giddiness: Secondary | ICD-10-CM | POA: Diagnosis not present

## 2022-12-07 DIAGNOSIS — Z743 Need for continuous supervision: Secondary | ICD-10-CM | POA: Diagnosis not present

## 2022-12-07 LAB — CBC
HCT: 45.4 % (ref 39.0–52.0)
Hemoglobin: 15.2 g/dL (ref 13.0–17.0)
MCH: 31.8 pg (ref 26.0–34.0)
MCHC: 33.5 g/dL (ref 30.0–36.0)
MCV: 95 fL (ref 80.0–100.0)
Platelets: 173 10*3/uL (ref 150–400)
RBC: 4.78 MIL/uL (ref 4.22–5.81)
RDW: 12.4 % (ref 11.5–15.5)
WBC: 6.5 10*3/uL (ref 4.0–10.5)
nRBC: 0 % (ref 0.0–0.2)

## 2022-12-07 LAB — URINALYSIS, ROUTINE W REFLEX MICROSCOPIC
Bilirubin Urine: NEGATIVE
Glucose, UA: NEGATIVE mg/dL
Hgb urine dipstick: NEGATIVE
Ketones, ur: NEGATIVE mg/dL
Leukocytes,Ua: NEGATIVE
Nitrite: NEGATIVE
Protein, ur: NEGATIVE mg/dL
Specific Gravity, Urine: 1.012 (ref 1.005–1.030)
pH: 6 (ref 5.0–8.0)

## 2022-12-07 LAB — BASIC METABOLIC PANEL
Anion gap: 8 (ref 5–15)
BUN: 12 mg/dL (ref 8–23)
CO2: 24 mmol/L (ref 22–32)
Calcium: 7.9 mg/dL — ABNORMAL LOW (ref 8.9–10.3)
Chloride: 102 mmol/L (ref 98–111)
Creatinine, Ser: 0.67 mg/dL (ref 0.61–1.24)
GFR, Estimated: >60 mL/min (ref >60)
Glucose, Bld: 101 mg/dL — ABNORMAL HIGH (ref 70–99)
Potassium: 5.1 mmol/L (ref 3.5–5.1)
Sodium: 134 mmol/L — ABNORMAL LOW (ref 135–145)

## 2022-12-07 LAB — TROPONIN I (HIGH SENSITIVITY)
Troponin I (High Sensitivity): 8 ng/L (ref <18)
Troponin I (High Sensitivity): 10 ng/L (ref <18)

## 2022-12-07 MED ORDER — LACTATED RINGERS IV BOLUS
1000.0000 mL | Freq: Once | INTRAVENOUS | Status: AC
  Administered 2022-12-07: 1000 mL via INTRAVENOUS

## 2022-12-07 NOTE — ED Notes (Signed)
Did not send UA in cup because was contaminated with blood from when pt removed IV.

## 2022-12-07 NOTE — ED Notes (Signed)
Report to [facility] # [phone] pt continues to wait for transport to arrive pt transport forms complete and in folder for transport

## 2022-12-07 NOTE — ED Triage Notes (Signed)
Pt presents to ER from [facility] via EMS. Per EMS initial BP at scene 74/46 after fluids LR 125/65 Pt reports he has been living at this facility for 2 days. Pt talks in complete sentences no respiratory distress noted. Pt has strong odor of urine.

## 2022-12-07 NOTE — ED Notes (Signed)
Pt deemed appropriate for dc to [facility] med nes form complete transport Sales promotion account executive attempts to contact facility for update on pt return no answer will attempt again in 3 minutes

## 2022-12-07 NOTE — ED Provider Notes (Signed)
[facility] Provider Note    Event Date/Time   First MD Initiated Contact with Patient 12/07/22 1515     (approximate)   History   Chief Complaint Weakness   HPI  IVES TARA is a 82 y.o. male with past medical history of hypertension, hyperlipidemia, diabetes, and cognitive disability who presents to the ED for weakness.  Per EMS, they were [facility] where patient was feeling lightheaded and found to have blood pressure of 74/46.  He was given approximately 400 cc of IV fluids with EMS and blood pressure improved to 125/65.  On arrival to the ED, he states that his symptoms have resolved and he denies any complaints.  He reports he was feeling lightheaded earlier but denies any chest pain or shortness of breath.  He denies any fevers, cough, nausea, vomiting, diarrhea, or dysuria.     Physical Exam   Triage Vital Signs: ED Triage Vitals [12/07/22 1429]  Encounter Vitals Group     BP (!) 102/56     Systolic BP Percentile      Diastolic BP Percentile      Pulse Rate 84     Resp 16     Temp 98.2 F (36.8 C)     Temp Source Oral     SpO2 96 %     Weight 200 lb (90.7 kg)     Height 6' (1.829 m)     Head Circumference      Peak Flow      Pain Score 0     Pain Loc      Pain Education      Exclude from Growth Chart     Most recent vital signs: Vitals:   12/07/22 1630 12/07/22 1823  BP: (!) 161/77   Pulse: 74   Resp: 16   Temp:  (!) 97.5 F (36.4 C)  SpO2: 98%     Constitutional: Alert and oriented. Eyes: Conjunctivae are normal. Head: Atraumatic. Nose: No congestion/rhinnorhea. Mouth/Throat: Mucous membranes are moist.  Cardiovascular: Normal rate, regular rhythm. Grossly normal heart sounds.  2+ radial pulses bilaterally. Respiratory: Normal respiratory effort.  No retractions. Lungs CTAB. Gastrointestinal: Soft and nontender. No distention. Musculoskeletal: No lower extremity tenderness nor  edema.  Neurologic:  Normal speech and language. No gross focal neurologic deficits are appreciated.    ED Results / Procedures / Treatments   Labs (all labs ordered are listed, but only abnormal results are displayed) Labs Reviewed  URINALYSIS, ROUTINE W REFLEX MICROSCOPIC - Abnormal; Notable for the following components:      Result Value   Color, Urine YELLOW (*)    APPearance CLEAR (*)    All other components within normal limits  BASIC METABOLIC PANEL - Abnormal; Notable for the following components:   Sodium 134 (*)    Glucose, Bld 101 (*)    Calcium 7.9 (*)    All other components within normal limits  CBC  TROPONIN I (HIGH SENSITIVITY)  TROPONIN I (HIGH SENSITIVITY)     EKG  ED ECG REPORT I, Chesley Noon, the attending physician, personally viewed and interpreted this ECG.   Date: 12/07/2022  EKG Time: 14:32  Rate: 80  Rhythm: normal sinus rhythm  Axis: Normal  Intervals:none  ST&T Change: Inferolateral T wave inversions  PROCEDURES:  Critical Care performed: No  Procedures   MEDICATIONS ORDERED IN ED: Medications  lactated ringers bolus 1,000 mL (0 mLs Intravenous Stopped 12/07/22 1633)  IMPRESSION / MDM / ASSESSMENT AND PLAN / ED COURSE  I reviewed the triage vital signs and the nursing notes.                              82 y.o. male with past medical history of hypertension, hyperlipidemia, diabetes, and cognitive disability who presents to the ED for episode of lightheadedness with low blood pressure at his assisted living facility.  Patient's presentation is most consistent with acute presentation with potential threat to life or bodily function.  Differential diagnosis includes, but is not limited to, arrhythmia, ACS, anemia, electrolyte abnormality, AKI, sepsis.  Patient well-appearing and in no acute distress, vital signs unremarkable on arrival to the ED.  EKG shows no evidence of arrhythmia, does show T wave inversions but patient  denies any chest pain or shortness of breath.  2 sets of troponin are within normal limits and no events noted on cardiac monitor.  Additional labs are reassuring with no significant anemia, leukocytosis, electrolyte abnormality, or AKI.  No findings concerning for infectious process and given patient remains asymptomatic here in the ED, he is appropriate for discharge back to his facility.  He was counseled to return to the ED for new or worsening symptoms, patient agrees with plan.      FINAL CLINICAL IMPRESSION(S) / ED DIAGNOSES   Final diagnoses:  Lightheadedness     Rx / DC Orders   ED Discharge Orders     None        Note:  This document was prepared using Dragon voice recognition software and may include unintentional dictation errors.   Chesley Noon, MD 12/07/22 [phone]

## 2022-12-07 NOTE — ED Notes (Signed)
Resent Light green top to the Lab per request.

## 2022-12-07 NOTE — ED Notes (Signed)
EDP at bedside  

## 2022-12-07 NOTE — ED Notes (Signed)
Sent UA now. UA that was ready earlier was contaminated with blood that came from the patient pulling out his IV.

## 2022-12-12 DIAGNOSIS — E1121 Type 2 diabetes mellitus with diabetic nephropathy: Secondary | ICD-10-CM | POA: Diagnosis not present

## 2022-12-12 DIAGNOSIS — G459 Transient cerebral ischemic attack, unspecified: Secondary | ICD-10-CM | POA: Diagnosis not present

## 2022-12-18 ENCOUNTER — Ambulatory Visit: Payer: Medicare PPO | Admitting: Family Medicine

## 2022-12-27 ENCOUNTER — Telehealth: Payer: Self-pay

## 2022-12-27 NOTE — Telephone Encounter (Signed)
Copied from CRM [id number]. Topic: General - Other >> Dec 26, 2022  3:45 PM Franchot Heidelberg wrote: Reason for CRM: Delorise Shiner, the caregiver at the [facility] care unit and they have questions about his medication that they want to discuss with the clinic.   Best contact: [phone]

## 2022-12-28 NOTE — Telephone Encounter (Signed)
FL2  that was provided is scanned into media

## 2022-12-28 NOTE — Telephone Encounter (Signed)
FL2 with 4 medications but they also have another printed medication list with more meds than what is listed on FL2 so they would like to know what he should be taking and how as well as how often he should be checking it   Please call back at [phone] at earliest convenience

## 2022-12-28 NOTE — Telephone Encounter (Signed)
Please call to see what questions they have.

## 2022-12-29 NOTE — Telephone Encounter (Signed)
Todd Diaz, Can you look at the [facility] that you completed for him?  It needs to list all meds that he is taking, not just the ones we Rx.  I haven't seen him in a few years, so hoping you have the most up-to-date information on him.  He is only going to get the meds listed on the FL2 at his memory care unit, so it needs to be inclusive.

## 2023-01-05 NOTE — Telephone Encounter (Signed)
Left message on sons #, Trey Paula, for appt time 01/11/23 at 2:00 p.m.

## 2023-01-09 DIAGNOSIS — E1121 Type 2 diabetes mellitus with diabetic nephropathy: Secondary | ICD-10-CM | POA: Diagnosis not present

## 2023-01-09 DIAGNOSIS — G459 Transient cerebral ischemic attack, unspecified: Secondary | ICD-10-CM | POA: Diagnosis not present

## 2023-01-09 DIAGNOSIS — I10 Essential (primary) hypertension: Secondary | ICD-10-CM | POA: Diagnosis not present

## 2023-01-09 DIAGNOSIS — N401 Enlarged prostate with lower urinary tract symptoms: Secondary | ICD-10-CM | POA: Diagnosis not present

## 2023-01-09 DIAGNOSIS — E785 Hyperlipidemia, unspecified: Secondary | ICD-10-CM | POA: Diagnosis not present

## 2023-01-11 ENCOUNTER — Ambulatory Visit: Payer: Medicare PPO | Admitting: Family Medicine

## 2023-01-11 NOTE — Progress Notes (Deleted)
      Established patient visit   Patient: Todd Diaz   DOB: [birth year]   83 y.o. Male  MRN: [id number] Visit Date: 01/11/2023  Today's healthcare provider: Jon Eva, MD   No chief complaint on file.  Subjective    HPI   Discussed the use of AI scribe software for clinical note transcription with the patient, who gave verbal consent to proceed.  History of Present Illness             Medications: Outpatient Medications Prior to Visit  Medication Sig   aspirin EC 81 MG tablet Take 81 mg by mouth daily. (Patient not taking: Reported on 11/30/2022)   Blood Glucose Calibration (ACCU-CHEK COMPACT PLUS CONTROL) SOLN Test blood sugar daily. Dx code: E11.9 (Patient not taking: Reported on 11/30/2022)   blood glucose meter kit and supplies KIT Dispense based on patient and insurance preference. Check sugar once daily as directed. (FOR ICD-9 250.00, 250.01). (Patient not taking: Reported on 11/30/2022)   Blood Glucose Monitoring Suppl (BLOOD GLUCOSE METER KIT AND SUPPLIES) KIT Test blood sugar daily. Dx code: E11.9 (Patient not taking: Reported on 11/30/2022)   clopidogrel  (PLAVIX ) 75 MG tablet TAKE 1 TABLET(75 MG) BY MOUTH DAILY. Please schedule office visit before any future refill. (Patient not taking: Reported on 11/30/2022)   gabapentin  (NEURONTIN ) 300 MG capsule TAKE 1 CAPSULE(300 MG) BY MOUTH AT BEDTIME. Please schedule office visit before any future refills (Patient not taking: Reported on 11/30/2022)   glucose blood test strip Test blood sugar daily. Dx Code: E11.9 (Patient not taking: Reported on 11/30/2022)   hydrochlorothiazide  (HYDRODIURIL ) 12.5 MG tablet TAKE 1 TABLET(12.5 MG) BY MOUTH DAILY.   Lancets MISC Test blood sugar daily. Dx code: E11.9 (Patient not taking: Reported on 11/30/2022)   lisinopril  (ZESTRIL ) 40 MG tablet Take 1 tablet (40 mg total) by mouth daily.   lovastatin  (MEVACOR ) 40 MG tablet Take 1 tablet (40 mg total) by mouth at bedtime.    sildenafil  (REVATIO ) 20 MG tablet TAKE 2 TO 5 TABLETS BY MOUTH 1 HOUR BEFORE SEXUAL ACTIVITY (Patient not taking: Reported on 11/30/2022)   tamsulosin  (FLOMAX ) 0.4 MG CAPS capsule TAKE 1 CAPSULE(0.4 MG) BY MOUTH DAILY AFTER BREAKFAST (Patient not taking: Reported on 11/30/2022)   No facility-administered medications prior to visit.    Review of Systems {Insert previous labs (optional):23779} {See past labs  Heme  Chem  Endocrine  Serology  Results Review (optional):1}   Objective    There were no vitals taken for this visit. {Insert last BP/Wt (optional):23777}{See vitals history (optional):1}  Physical Exam   No results found for any visits on 01/11/23.  Assessment & Plan     Problem List Items Addressed This Visit   None   Assessment and Plan               No follow-ups on file.       Jon Eva, MD  [facility] [phone] (phone) [phone] (fax)  [facility]

## 2023-01-16 DIAGNOSIS — G459 Transient cerebral ischemic attack, unspecified: Secondary | ICD-10-CM | POA: Diagnosis not present

## 2023-01-16 DIAGNOSIS — I10 Essential (primary) hypertension: Secondary | ICD-10-CM | POA: Diagnosis not present

## 2023-01-16 DIAGNOSIS — N401 Enlarged prostate with lower urinary tract symptoms: Secondary | ICD-10-CM | POA: Diagnosis not present

## 2023-01-16 DIAGNOSIS — E785 Hyperlipidemia, unspecified: Secondary | ICD-10-CM | POA: Diagnosis not present

## 2023-01-16 DIAGNOSIS — E1121 Type 2 diabetes mellitus with diabetic nephropathy: Secondary | ICD-10-CM | POA: Diagnosis not present

## 2023-01-22 DIAGNOSIS — I10 Essential (primary) hypertension: Secondary | ICD-10-CM | POA: Diagnosis not present

## 2023-01-22 DIAGNOSIS — E785 Hyperlipidemia, unspecified: Secondary | ICD-10-CM | POA: Diagnosis not present

## 2023-01-22 DIAGNOSIS — N401 Enlarged prostate with lower urinary tract symptoms: Secondary | ICD-10-CM | POA: Diagnosis not present

## 2023-01-22 DIAGNOSIS — E1121 Type 2 diabetes mellitus with diabetic nephropathy: Secondary | ICD-10-CM | POA: Diagnosis not present

## 2023-02-06 DIAGNOSIS — G629 Polyneuropathy, unspecified: Secondary | ICD-10-CM | POA: Diagnosis not present

## 2023-02-06 DIAGNOSIS — N182 Chronic kidney disease, stage 2 (mild): Secondary | ICD-10-CM | POA: Diagnosis not present

## 2023-02-15 DIAGNOSIS — E1121 Type 2 diabetes mellitus with diabetic nephropathy: Secondary | ICD-10-CM | POA: Diagnosis not present

## 2023-02-15 DIAGNOSIS — I1310 Hypertensive heart and chronic kidney disease without heart failure, with stage 1 through stage 4 chronic kidney disease, or unspecified chronic kidney disease: Secondary | ICD-10-CM | POA: Diagnosis not present

## 2023-02-15 DIAGNOSIS — E785 Hyperlipidemia, unspecified: Secondary | ICD-10-CM | POA: Diagnosis not present

## 2023-02-15 DIAGNOSIS — N401 Enlarged prostate with lower urinary tract symptoms: Secondary | ICD-10-CM | POA: Diagnosis not present

## 2023-02-15 DIAGNOSIS — G459 Transient cerebral ischemic attack, unspecified: Secondary | ICD-10-CM | POA: Diagnosis not present

## 2023-02-20 DIAGNOSIS — I119 Hypertensive heart disease without heart failure: Secondary | ICD-10-CM | POA: Diagnosis not present

## 2023-02-20 DIAGNOSIS — Z8673 Personal history of transient ischemic attack (TIA), and cerebral infarction without residual deficits: Secondary | ICD-10-CM | POA: Diagnosis not present

## 2023-02-20 DIAGNOSIS — N400 Enlarged prostate without lower urinary tract symptoms: Secondary | ICD-10-CM | POA: Diagnosis not present

## 2023-02-20 DIAGNOSIS — E1140 Type 2 diabetes mellitus with diabetic neuropathy, unspecified: Secondary | ICD-10-CM | POA: Diagnosis not present

## 2023-02-20 DIAGNOSIS — E119 Type 2 diabetes mellitus without complications: Secondary | ICD-10-CM | POA: Diagnosis not present

## 2023-02-20 DIAGNOSIS — E782 Mixed hyperlipidemia: Secondary | ICD-10-CM | POA: Diagnosis not present

## 2023-03-09 DIAGNOSIS — N182 Chronic kidney disease, stage 2 (mild): Secondary | ICD-10-CM | POA: Diagnosis not present

## 2023-03-09 DIAGNOSIS — G629 Polyneuropathy, unspecified: Secondary | ICD-10-CM | POA: Diagnosis not present

## 2023-12-19 NOTE — Progress Notes (Signed)
 12/20/2023 Todd Diaz [id number] 11-05-1940  Gastroenterology Office Note    Referring Provider: Myrla Jon HERO, MD Primary Care Physician:  Myrla Jon HERO, MD  Primary GI Provider: Jinny Carmine, MD    Chief Complaint   Chief Complaint  Patient presents with   Abdominal Pain   Gastroesophageal Reflux     History of Present Illness   Todd Diaz is a 83 y.o. male with PMHX of dementia, diabetes presenting today at the request of Myrla, Jon HERO, MD due to GERD and abdominal pain  Patient lives at [address] memory care unit, he is accompanied by caregiver.  Patient reports he has acid reflux for a while now. He would take takes and recently also taking famotidine.  Per facility med list, patient is taking famotidine 20 mg twice a day. Son had also informed staff that patient was still complaining of reflux even with taking Tums and famotidine daily.  When asked about abdominal pain, patient reports he may have very mild abdominal pain once a week and when he takes Tums he feels better.  Caregiver does not think abdominal pain has been an issue.  He reports that he eats a lot of fried and spicy foods, he eats what ever he is given.  He drinks 2 sodas a day and sometimes sweet tea or coffee.  Denies NSAID or alcohol use.  Occasionally he chews tobacco. He states he does not drink a lot of water.  Denies nausea, vomiting or nocturnal symptoms.  He denies any complaints with bowel movements, has a formed bowel movement daily.  Denies melena or hematochezia.    Past Medical History:  Diagnosis Date   Arthritis    Knees   Avascular necrosis of medial femoral condyle (HCC)    Chronic prostatitis    Diabetes mellitus (HCC)    controlled with medication   Erectile dysfunction    Hearing loss    Hyperlipidemia    Hypertension    controlled with medication   Hypertrophy of prostate without urinary obstruction and other lower urinary tract symptoms (LUTS)     Infection of urinary tract    ASSESSMENT:Recurrent MRSA UTI F/B Dr. Vernal and Dr. Ike   TIA (transient ischemic attack) 02/23/2014   R sided weakness and numbness; duration 1 hour; evaluated [facility].   Tobacco use disorder     Past Surgical History:  Procedure Laterality Date   Admission  03/09/2012   [facility]; 24 hour admission; LE numbness/tingling.  CT head, MRI brain, carotid dopplers, 2D-echo.   AMPUTATION  2009   L fifth finger surgery for near amputation. [facility].   BLADDER STONE REMOVAL     EYE SURGERY  08/09/2013   B cataract surgery; [facility]   HERNIA REPAIR  04/09/2013   3 repaired; [facility].   JOINT REPLACEMENT     PARTIAL KNEE ARTHROPLASTY  07/24/2011   Procedure: UNICOMPARTMENTAL KNEE;  Surgeon: Lamar DELENA Millman, MD;  Location: [facility] OR;  Service: Orthopedics;  Laterality: Left;   PROSTATE SURGERY     RETINAL TEAR REPAIR CRYOTHERAPY  08/09/2013   [facility]    Current Outpatient Medications  Medication Sig Dispense Refill   aspirin EC 81 MG tablet Take 81 mg by mouth daily.     Blood Glucose Calibration (ACCU-CHEK COMPACT PLUS CONTROL) SOLN Test blood sugar daily. Dx code: E11.9 1 each 0   blood glucose meter kit and supplies KIT Dispense based on patient and insurance preference. Check sugar once daily as directed. (  FOR ICD-9 250.00, 250.01). 1 each 0   Blood Glucose Monitoring Suppl (BLOOD GLUCOSE METER KIT AND SUPPLIES) KIT Test blood sugar daily. Dx code: E11.9 1 each 0   clopidogrel  (PLAVIX ) 75 MG tablet TAKE 1 TABLET(75 MG) BY MOUTH DAILY. Please schedule office visit before any future refill. 30 tablet 0   gabapentin  (NEURONTIN ) 300 MG capsule TAKE 1 CAPSULE(300 MG) BY MOUTH AT BEDTIME. Please schedule office visit before any future refills 90 capsule 0   glucose blood test strip Test blood sugar daily. Dx Code: E11.9 100 each 3   hydrochlorothiazide  (HYDRODIURIL ) 12.5 MG tablet TAKE 1 TABLET(12.5 MG) BY MOUTH DAILY. 90 tablet 1   Lancets MISC Test blood sugar daily. Dx code: E11.9  100 each 3   lisinopril  (ZESTRIL ) 40 MG tablet Take 1 tablet (40 mg total) by mouth daily. 90 tablet 1   lovastatin  (MEVACOR ) 40 MG tablet Take 1 tablet (40 mg total) by mouth at bedtime. 90 tablet 3   sildenafil  (REVATIO ) 20 MG tablet TAKE 2 TO 5 TABLETS BY MOUTH 1 HOUR BEFORE SEXUAL ACTIVITY (Patient not taking: Reported on 12/20/2023) 30 tablet 3   tamsulosin  (FLOMAX ) 0.4 MG CAPS capsule TAKE 1 CAPSULE(0.4 MG) BY MOUTH DAILY AFTER BREAKFAST (Patient not taking: Reported on 12/20/2023) 90 capsule 3   No current facility-administered medications for this visit.    Allergies as of 12/20/2023   (No Known Allergies)    Family History  Problem Relation Age of Onset   Hypertension Mother    Cancer Mother        lung   Heart attack Father    Hypertension Father    Hyperlipidemia Father    Aneurysm Father 43       Brain   Heart disease Father 16       AMI as cause of death   Diabetes Son    Healthy Sister     Social History   Socioeconomic History   Marital status: Widowed    Spouse name: Not on file   Number of children: 4   Years of education: 12   Highest education level: High school graduate  Occupational History   Occupation: Visual merchandiser  Tobacco Use   Smoking status: Former    Current packs/day: 0.00    Average packs/day: 0.5 packs/day for 20.0 years (10.0 ttl pk-yrs)    Types: Cigarettes    Start date: 01/09/1966    Quit date: 01/09/1986    Years since quitting: 37.9   Smokeless tobacco: Current    Types: Chew   Tobacco comments:    chews about 1/2 a pack of tobacco currently;   Vaping Use   Vaping status: Never Used  Substance and Sexual Activity   Alcohol use: Yes    Alcohol/week: 8.0 - 10.0 standard drinks of alcohol    Types: 8 - 10 Cans of beer per week    Comment: 4-5 beers per weekend night.  DUI in 2000.   Drug use: No   Sexual activity: Not Currently    Partners: Female  Other Topics Concern   Not on file  Social History Narrative   Widowed  since 02/2009 after 44 years second marriage       Children: 6 children, 2 stepchildren; 8 grandchildren, 2 gg.      Lives: with cat, dog.      Employment: retired; working part time; engineer, civil (consulting) work.      Tobacco: quit smoking age 7; smoked x 22 years.  Alcohol:  Beer every weekend (5-6 beers per night on weekends).      Seatbelt:  Always uses seat belts, Smoke alarm in the home,      Guns: >5 guns in the home loaded and locked up.      Caffeine ldz:Rnqqzz, Tea, Carbonated beverages; consumes a moderate amount.      Exercise: Inactive.     Advanced Directives:  +LIVING WILL; FULL CODE, No HCPOA.      ADLs: independent with ADLs.  Drives.   Social Drivers of Health   Tobacco Use: High Risk (12/20/2023)   Patient History    Smoking Tobacco Use: Former    Smokeless Tobacco Use: Current    Passive Exposure: Not on file  Financial Resource Strain: Not on file  Food Insecurity: Not on file  Transportation Needs: Not on file  Physical Activity: Not on file  Stress: Not on file  Social Connections: Not on file  Intimate Partner Violence: Not on file  Depression [phone]): Low Risk (08/02/2021)   Depression (PHQ2-9)    PHQ-2 Score: 0  Alcohol Screen: Not on file  Housing: Not on file  Utilities: Not on file  Health Literacy: Not on file     RELEVANT GI HISTORY, IMAGING AND LABS: CBC    Component Value Date/Time   WBC 6.5 12/07/2022 1438   RBC 4.78 12/07/2022 1438   HGB 15.2 12/07/2022 1438   HGB 15.0 11/30/2022 1524   HCT 45.4 12/07/2022 1438   HCT 44.0 11/30/2022 1524   PLT 173 12/07/2022 1438   PLT 136 (L) 11/30/2022 1524   MCV 95.0 12/07/2022 1438   MCV 93 11/30/2022 1524   MCV 93 10/21/2013 2206   MCH 31.8 12/07/2022 1438   MCHC 33.5 12/07/2022 1438   RDW 12.4 12/07/2022 1438   RDW 11.9 11/30/2022 1524   RDW 13.9 10/21/2013 2206   LYMPHSABS 1.0 11/30/2022 1524   LYMPHSABS 1.9 10/21/2013 2206   MONOABS 0.6 06/14/2022 0346   MONOABS 0.6 10/21/2013 2206   EOSABS  0.2 11/30/2022 1524   EOSABS 0.1 10/21/2013 2206   BASOSABS 0.0 11/30/2022 1524   BASOSABS 0.0 10/21/2013 2206   No results for input(s): HGB in the last 8760 hours.  CMP     Component Value Date/Time   NA 134 (L) 12/07/2022 1506   NA 141 11/30/2022 1524   NA 140 10/21/2013 2206   K 5.1 12/07/2022 1506   K 3.6 10/21/2013 2206   CL 102 12/07/2022 1506   CL 104 10/21/2013 2206   CO2 24 12/07/2022 1506   CO2 30 10/21/2013 2206   GLUCOSE 101 (H) 12/07/2022 1506   GLUCOSE 128 (H) 10/21/2013 2206   BUN 12 12/07/2022 1506   BUN 11 11/30/2022 1524   BUN 16 10/21/2013 2206   CREATININE 0.67 12/07/2022 1506   CREATININE 0.81 06/15/2015 1336   CALCIUM 7.9 (L) 12/07/2022 1506   CALCIUM 9.0 10/21/2013 2206   PROT 6.7 11/30/2022 1524   PROT 7.2 10/21/2013 2206   ALBUMIN 3.8 11/30/2022 1524   ALBUMIN 3.6 10/21/2013 2206   AST 32 11/30/2022 1524   AST 44 (H) 10/21/2013 2206   ALT 28 11/30/2022 1524   ALT 48 10/21/2013 2206   ALKPHOS 137 (H) 11/30/2022 1524   ALKPHOS 93 10/21/2013 2206   BILITOT 0.5 11/30/2022 1524   BILITOT 0.2 10/21/2013 2206   GFRNONAA >60 12/07/2022 1506   GFRNONAA >60 10/21/2013 2206   GFRNONAA >60 04/09/2012 0432   GFRAA 93 10/06/2019  1434   GFRAA >60 10/21/2013 2206   GFRAA >60 04/09/2012 0432      Latest Ref Rng & Units 11/30/2022    3:24 PM 08/02/2021   10:57 AM 10/06/2019    2:34 PM  Hepatic Function  Total Protein 6.0 - 8.5 g/dL 6.7  7.2  6.6   Albumin 3.7 - 4.7 g/dL 3.8  4.3  4.0   AST 0 - 40 IU/L 32  27  22   ALT 0 - 44 IU/L 28  16  17    Alk Phosphatase 44 - 121 IU/L 137  114  99   Total Bilirubin 0.0 - 1.2 mg/dL 0.5  0.8  0.3       Review of Systems   All systems reviewed and negative except where noted in HPI.    Physical Exam  BP 108/73 (BP Location: Left Arm, Patient Position: Sitting, Cuff Size: Normal)   Pulse 93   Ht 6' (1.829 m)   Wt 171 lb (77.6 kg)   BMI 23.19 kg/m  No LMP for male patient. General:   Alert and  oriented. Pleasant, forgetful, and cooperative. Well-nourished and well-developed.  In no acute distress. Head:  Normocephalic and atraumatic. Eyes:  Without icterus Ears:  Normal auditory acuity. Lungs:  Respirations even and unlabored.  Clear throughout to auscultation.   No wheezes, crackles, or rhonchi. No acute distress. Heart:  Regular rate and rhythm; no murmurs, clicks, rubs, or gallops. Abdomen:  Normal bowel sounds.  No bruits.  Soft, non-tender and non-distended without masses, hepatosplenomegaly or hernias noted.  No guarding or rebound tenderness.   Rectal:  Deferred. Msk:  Symmetrical without gross deformities. Normal posture. Extremities:  Without edema. Neurologic:  Alert and  oriented x4;  grossly normal neurologically. Skin:  Intact without significant lesions or rashes. Psych:  Alert and cooperative. Normal mood and affect.   Assessment & Plan   DALAN COWGER is a 83 y.o. male presenting today with GERD.   GERD.  Will manage conservatively at this time with lifestyle modifications and medication adjustments. - Lifestyle changes discussed, avoid trigger foods, limit sodas and caffeine, do not eat meals 3 hr prior to bed.  Increase water intake. - start pantoprazole Continue PPI 30 min prior to meals - Continue famotidine 20 mg at night - Can consider upper endoscopy if symptoms not improved with lifestyle modifications and PPI.  Follow-up in 6 weeks  Grayce Bohr, DNP, AGNP-C [facility]

## 2023-12-20 ENCOUNTER — Encounter: Payer: Self-pay | Admitting: Family Medicine

## 2023-12-20 ENCOUNTER — Ambulatory Visit: Admitting: Family Medicine

## 2023-12-20 VITALS — BP 108/73 | HR 93 | Ht 72.0 in | Wt 171.0 lb

## 2023-12-20 DIAGNOSIS — K219 Gastro-esophageal reflux disease without esophagitis: Secondary | ICD-10-CM | POA: Diagnosis not present

## 2023-12-20 MED ORDER — PANTOPRAZOLE SODIUM 40 MG PO TBEC: 40.0000 mg | DELAYED_RELEASE_TABLET | Freq: Every day | ORAL | 1 refills | Status: AC

## 2023-12-20 MED ORDER — FAMOTIDINE 20 MG PO TABS: 20.0000 mg | ORAL_TABLET | Freq: Every day | ORAL | 2 refills | Status: AC

## 2024-01-28 ENCOUNTER — Other Ambulatory Visit: Payer: Self-pay

## 2024-01-30 NOTE — Progress Notes (Unsigned)
 "   01/30/2024 DEMECO DUCKSWORTH [id number] 10/14/1940  Gastroenterology Office Note     Primary Care Physician:  Myrla Jon HERO, MD  Primary GI Provider: Celestia Rima, NP; Jinny Carmine, MD    Chief Complaint   No chief complaint on file.    History of Present Illness   NOELLE HOOGLAND is a 84 y.o. male with PMHX of *** , presenting today for follow-up.  Patient last seen by myself on 12/20/2023 for GERD.  Started on pantoprazole , continue famotidine  at night.      Past Medical History:  Diagnosis Date   Arthritis    Knees   Avascular necrosis of medial femoral condyle (HCC)    Chronic prostatitis    Diabetes mellitus (HCC)    controlled with medication   Erectile dysfunction    Hearing loss    Hyperlipidemia    Hypertension    controlled with medication   Hypertrophy of prostate without urinary obstruction and other lower urinary tract symptoms (LUTS)    Infection of urinary tract    ASSESSMENT:Recurrent MRSA UTI F/B Dr. Vernal and Dr. Ike   TIA (transient ischemic attack) 02/23/2014   R sided weakness and numbness; duration 1 hour; evaluated [facility].   Tobacco use disorder     Past Surgical History:  Procedure Laterality Date   Admission  03/09/2012   [facility]; 24 hour admission; LE numbness/tingling.  CT head, MRI brain, carotid dopplers, 2D-echo.   AMPUTATION  2009   L fifth finger surgery for near amputation. [facility].   BLADDER STONE REMOVAL     EYE SURGERY  08/09/2013   B cataract surgery; [facility]   HERNIA REPAIR  04/09/2013   3 repaired; [facility].   JOINT REPLACEMENT     PARTIAL KNEE ARTHROPLASTY  07/24/2011   Procedure: UNICOMPARTMENTAL KNEE;  Surgeon: Lamar DELENA Millman, MD;  Location: [facility] OR;  Service: Orthopedics;  Laterality: Left;   PROSTATE SURGERY     RETINAL TEAR REPAIR CRYOTHERAPY  08/09/2013   [facility]    Current Outpatient Medications  Medication Sig Dispense Refill   aspirin EC 81 MG tablet Take 81 mg by mouth daily.     Blood Glucose Calibration  (ACCU-CHEK COMPACT PLUS CONTROL) SOLN Test blood sugar daily. Dx code: E11.9 1 each 0   blood glucose meter kit and supplies KIT Dispense based on patient and insurance preference. Check sugar once daily as directed. (FOR ICD-9 250.00, 250.01). 1 each 0   Blood Glucose Monitoring Suppl (BLOOD GLUCOSE METER KIT AND SUPPLIES) KIT Test blood sugar daily. Dx code: E11.9 1 each 0   clopidogrel  (PLAVIX ) 75 MG tablet TAKE 1 TABLET(75 MG) BY MOUTH DAILY. Please schedule office visit before any future refill. 30 tablet 0   famotidine  (PEPCID ) 20 MG tablet Take 1 tablet (20 mg total) by mouth at bedtime. 30 tablet 2   gabapentin  (NEURONTIN ) 300 MG capsule TAKE 1 CAPSULE(300 MG) BY MOUTH AT BEDTIME. Please schedule office visit before any future refills 90 capsule 0   glucose blood test strip Test blood sugar daily. Dx Code: E11.9 100 each 3   hydrochlorothiazide  (HYDRODIURIL ) 12.5 MG tablet TAKE 1 TABLET(12.5 MG) BY MOUTH DAILY. 90 tablet 1   Lancets MISC Test blood sugar daily. Dx code: E11.9 100 each 3   lisinopril  (ZESTRIL ) 40 MG tablet Take 1 tablet (40 mg total) by mouth daily. 90 tablet 1   lovastatin  (MEVACOR ) 40 MG tablet Take 1 tablet (40 mg total) by mouth at bedtime. 90 tablet 3  pantoprazole  (PROTONIX ) 40 MG tablet Take 1 tablet (40 mg total) by mouth daily. 90 tablet 1   sildenafil  (REVATIO ) 20 MG tablet TAKE 2 TO 5 TABLETS BY MOUTH 1 HOUR BEFORE SEXUAL ACTIVITY (Patient not taking: Reported on 12/20/2023) 30 tablet 3   tamsulosin  (FLOMAX ) 0.4 MG CAPS capsule TAKE 1 CAPSULE(0.4 MG) BY MOUTH DAILY AFTER BREAKFAST (Patient not taking: Reported on 12/20/2023) 90 capsule 3   No current facility-administered medications for this visit.    Allergies as of 01/31/2024   (No Known Allergies)    Family History  Problem Relation Age of Onset   Hypertension Mother    Cancer Mother        lung   Heart attack Father    Hypertension Father    Hyperlipidemia Father    Aneurysm Father 68        Brain   Heart disease Father 36       AMI as cause of death   Diabetes Son    Healthy Sister     Social History   Socioeconomic History   Marital status: Widowed    Spouse name: Not on file   Number of children: 4   Years of education: 12   Highest education level: High school graduate  Occupational History   Occupation: Visual merchandiser  Tobacco Use   Smoking status: Former    Current packs/day: 0.00    Average packs/day: 0.5 packs/day for 20.0 years (10.0 ttl pk-yrs)    Types: Cigarettes    Start date: 01/09/1966    Quit date: 01/09/1986    Years since quitting: 38.0   Smokeless tobacco: Current    Types: Chew   Tobacco comments:    chews about 1/2 a pack of tobacco currently;   Vaping Use   Vaping status: Never Used  Substance and Sexual Activity   Alcohol use: Yes    Alcohol/week: 8.0 - 10.0 standard drinks of alcohol    Types: 8 - 10 Cans of beer per week    Comment: 4-5 beers per weekend night.  DUI in 2000.   Drug use: No   Sexual activity: Not Currently    Partners: Female  Other Topics Concern   Not on file  Social History Narrative   Widowed since 02/2009 after 44 years second marriage       Children: 6 children, 2 stepchildren; 8 grandchildren, 2 gg.      Lives: with cat, dog.      Employment: retired; working part time; engineer, civil (consulting) work.      Tobacco: quit smoking age 22; smoked x 22 years.      Alcohol:  Beer every weekend (5-6 beers per night on weekends).      Seatbelt:  Always uses seat belts, Smoke alarm in the home,      Guns: >5 guns in the home loaded and locked up.      Caffeine ldz:Rnqqzz, Tea, Carbonated beverages; consumes a moderate amount.      Exercise: Inactive.     Advanced Directives:  +LIVING WILL; FULL CODE, No HCPOA.      ADLs: independent with ADLs.  Drives.   Social Drivers of Health   Tobacco Use: High Risk (12/20/2023)   Patient History    Smoking Tobacco Use: Former    Smokeless Tobacco Use: Current    Passive Exposure: Not on  Actuary Strain: Not on file  Food Insecurity: Not on file  Transportation Needs: Not on file  Physical  Activity: Not on file  Stress: Not on file  Social Connections: Not on file  Intimate Partner Violence: Not on file  Depression [phone]): Low Risk (08/02/2021)   Depression (PHQ2-9)    PHQ-2 Score: 0  Alcohol Screen: Not on file  Housing: Not on file  Utilities: Not on file  Health Literacy: Not on file     RELEVANT GI HISTORY, IMAGING AND LABS: CBC    Component Value Date/Time   WBC 6.5 12/07/2022 1438   RBC 4.78 12/07/2022 1438   HGB 15.2 12/07/2022 1438   HGB 15.0 11/30/2022 1524   HCT 45.4 12/07/2022 1438   HCT 44.0 11/30/2022 1524   PLT 173 12/07/2022 1438   PLT 136 (L) 11/30/2022 1524   MCV 95.0 12/07/2022 1438   MCV 93 11/30/2022 1524   MCV 93 10/21/2013 2206   MCH 31.8 12/07/2022 1438   MCHC 33.5 12/07/2022 1438   RDW 12.4 12/07/2022 1438   RDW 11.9 11/30/2022 1524   RDW 13.9 10/21/2013 2206   LYMPHSABS 1.0 11/30/2022 1524   LYMPHSABS 1.9 10/21/2013 2206   MONOABS 0.6 06/14/2022 0346   MONOABS 0.6 10/21/2013 2206   EOSABS 0.2 11/30/2022 1524   EOSABS 0.1 10/21/2013 2206   BASOSABS 0.0 11/30/2022 1524   BASOSABS 0.0 10/21/2013 2206   No results for input(s): HGB in the last 8760 hours.  CMP     Component Value Date/Time   NA 134 (L) 12/07/2022 1506   NA 141 11/30/2022 1524   NA 140 10/21/2013 2206   K 5.1 12/07/2022 1506   K 3.6 10/21/2013 2206   CL 102 12/07/2022 1506   CL 104 10/21/2013 2206   CO2 24 12/07/2022 1506   CO2 30 10/21/2013 2206   GLUCOSE 101 (H) 12/07/2022 1506   GLUCOSE 128 (H) 10/21/2013 2206   BUN 12 12/07/2022 1506   BUN 11 11/30/2022 1524   BUN 16 10/21/2013 2206   CREATININE 0.67 12/07/2022 1506   CREATININE 0.81 06/15/2015 1336   CALCIUM 7.9 (L) 12/07/2022 1506   CALCIUM 9.0 10/21/2013 2206   PROT 6.7 11/30/2022 1524   PROT 7.2 10/21/2013 2206   ALBUMIN 3.8 11/30/2022 1524   ALBUMIN 3.6 10/21/2013  2206   AST 32 11/30/2022 1524   AST 44 (H) 10/21/2013 2206   ALT 28 11/30/2022 1524   ALT 48 10/21/2013 2206   ALKPHOS 137 (H) 11/30/2022 1524   ALKPHOS 93 10/21/2013 2206   BILITOT 0.5 11/30/2022 1524   BILITOT 0.2 10/21/2013 2206   GFRNONAA >60 12/07/2022 1506   GFRNONAA >60 10/21/2013 2206   GFRNONAA >60 04/09/2012 0432   GFRAA 93 10/06/2019 1434   GFRAA >60 10/21/2013 2206   GFRAA >60 04/09/2012 0432      Latest Ref Rng & Units 11/30/2022    3:24 PM 08/02/2021   10:57 AM 10/06/2019    2:34 PM  Hepatic Function  Total Protein 6.0 - 8.5 g/dL 6.7  7.2  6.6   Albumin 3.7 - 4.7 g/dL 3.8  4.3  4.0   AST 0 - 40 IU/L 32  27  22   ALT 0 - 44 IU/L 28  16  17    Alk Phosphatase 44 - 121 IU/L 137  114  99   Total Bilirubin 0.0 - 1.2 mg/dL 0.5  0.8  0.3       Review of Systems   All systems reviewed and negative except where noted in HPI.    Physical Exam  There were no  vitals taken for this visit. No LMP for male patient. General:   Alert and oriented. Pleasant and cooperative. Well-nourished and well-developed. In no acute distress.  Head:  Normocephalic and atraumatic. Eyes:  Without icterus Ears:  Normal auditory acuity. Neck:  Supple; no masses or thyromegaly. Lungs:  Respirations even and unlabored.  Clear throughout to auscultation.   No wheezes, crackles, or rhonchi. No acute distress. Heart:  Regular rate and rhythm; no murmurs, clicks, rubs, or gallops. Abdomen:  Normal bowel sounds.  No bruits.  Soft, non-tender and non-distended without masses, hepatosplenomegaly or hernias noted.  No guarding or rebound tenderness.  ***Negative Carnett sign.   Rectal:  Deferred.***  Msk:  Symmetrical without gross deformities. Normal posture. Extremities:  Without edema. Neurologic:  Alert and  oriented x4;  grossly normal neurologically. Skin:  Intact without significant lesions or rashes. Psych:  Alert and cooperative. Normal mood and affect.   Assessment & Plan   BABE CLENNEY is a 84 y.o. male presenting today with     Grayce Bohr, DNP, AGNP-C [facility]   "

## 2024-01-31 ENCOUNTER — Ambulatory Visit: Admitting: Family Medicine

## 2024-02-10 ENCOUNTER — Telehealth: Payer: Self-pay

## 2024-02-10 NOTE — Telephone Encounter (Signed)
 Call pt son to make sure he was aware of appt change due to weather. Per son I need to call [phone] at the memory care center and see If they would answer the phone. Per son it hard for staff to answer the phone. I call unable to leave message will try later and mail appt.

## 2024-02-11 ENCOUNTER — Ambulatory Visit: Admitting: Family Medicine

## 2024-03-12 ENCOUNTER — Ambulatory Visit: Admitting: Family Medicine
